# Patient Record
Sex: Male | Born: 1935 | Race: White | Hispanic: No | Smoking: Former smoker
Health system: Southern US, Community
[De-identification: ages and names within clinical notes are randomized; demographics above are authoritative.]

## PROBLEM LIST (undated history)

## (undated) DIAGNOSIS — I714 Abdominal aortic aneurysm, without rupture, unspecified: Secondary | ICD-10-CM

## (undated) DIAGNOSIS — D126 Benign neoplasm of colon, unspecified: Secondary | ICD-10-CM

## (undated) DIAGNOSIS — J90 Pleural effusion, not elsewhere classified: Secondary | ICD-10-CM

## (undated) DIAGNOSIS — K5790 Diverticulosis of intestine, part unspecified, without perforation or abscess without bleeding: Secondary | ICD-10-CM

## (undated) DIAGNOSIS — R0600 Dyspnea, unspecified: Secondary | ICD-10-CM

## (undated) DIAGNOSIS — E7800 Pure hypercholesterolemia, unspecified: Secondary | ICD-10-CM

## (undated) DIAGNOSIS — I10 Essential (primary) hypertension: Secondary | ICD-10-CM

## (undated) DIAGNOSIS — K649 Unspecified hemorrhoids: Secondary | ICD-10-CM

## (undated) DIAGNOSIS — N182 Chronic kidney disease, stage 2 (mild): Secondary | ICD-10-CM

## (undated) DIAGNOSIS — R600 Localized edema: Secondary | ICD-10-CM

## (undated) DIAGNOSIS — I4891 Unspecified atrial fibrillation: Secondary | ICD-10-CM

## (undated) DIAGNOSIS — J9610 Chronic respiratory failure, unspecified whether with hypoxia or hypercapnia: Secondary | ICD-10-CM

## (undated) DIAGNOSIS — M1990 Unspecified osteoarthritis, unspecified site: Secondary | ICD-10-CM

## (undated) HISTORY — DX: Essential (primary) hypertension: I10

## (undated) HISTORY — PX: HERNIA REPAIR: SHX51

## (undated) HISTORY — DX: Diverticulosis of intestine, part unspecified, without perforation or abscess without bleeding: K57.90

## (undated) HISTORY — DX: Benign neoplasm of colon, unspecified: D12.6

## (undated) HISTORY — DX: Abdominal aortic aneurysm, without rupture, unspecified: I71.40

## (undated) HISTORY — DX: Pure hypercholesterolemia, unspecified: E78.00

## (undated) HISTORY — DX: Unspecified osteoarthritis, unspecified site: M19.90

## (undated) HISTORY — PX: TONSILLECTOMY: SHX5217

## (undated) HISTORY — DX: Unspecified atrial fibrillation: I48.91

## (undated) HISTORY — DX: Unspecified hemorrhoids: K64.9

## (undated) HISTORY — DX: Abdominal aortic aneurysm, without rupture: I71.4

---

## 2002-08-29 DIAGNOSIS — D126 Benign neoplasm of colon, unspecified: Secondary | ICD-10-CM

## 2002-08-29 HISTORY — DX: Benign neoplasm of colon, unspecified: D12.6

## 2003-04-02 ENCOUNTER — Encounter: Payer: Self-pay | Admitting: Gastroenterology

## 2006-05-02 ENCOUNTER — Ambulatory Visit: Payer: Self-pay | Admitting: Gastroenterology

## 2006-05-10 ENCOUNTER — Encounter: Payer: Self-pay | Admitting: Gastroenterology

## 2006-05-10 ENCOUNTER — Ambulatory Visit: Payer: Self-pay | Admitting: Gastroenterology

## 2007-08-24 ENCOUNTER — Emergency Department (HOSPITAL_COMMUNITY): Admission: EM | Admit: 2007-08-24 | Discharge: 2007-08-24 | Payer: Self-pay | Admitting: Emergency Medicine

## 2008-01-23 ENCOUNTER — Emergency Department (HOSPITAL_COMMUNITY): Admission: EM | Admit: 2008-01-23 | Discharge: 2008-01-23 | Payer: Self-pay | Admitting: Emergency Medicine

## 2009-03-04 ENCOUNTER — Emergency Department (HOSPITAL_COMMUNITY): Admission: EM | Admit: 2009-03-04 | Discharge: 2009-03-04 | Payer: Self-pay | Admitting: Emergency Medicine

## 2009-04-07 ENCOUNTER — Encounter (INDEPENDENT_AMBULATORY_CARE_PROVIDER_SITE_OTHER): Payer: Self-pay | Admitting: *Deleted

## 2009-06-15 ENCOUNTER — Encounter (INDEPENDENT_AMBULATORY_CARE_PROVIDER_SITE_OTHER): Payer: Self-pay | Admitting: *Deleted

## 2009-06-15 ENCOUNTER — Ambulatory Visit: Payer: Self-pay | Admitting: Gastroenterology

## 2009-06-15 DIAGNOSIS — Z860100 Personal history of colon polyps, unspecified: Secondary | ICD-10-CM | POA: Insufficient documentation

## 2009-06-15 DIAGNOSIS — I4891 Unspecified atrial fibrillation: Secondary | ICD-10-CM | POA: Insufficient documentation

## 2009-07-14 ENCOUNTER — Ambulatory Visit: Payer: Self-pay | Admitting: Gastroenterology

## 2009-07-17 ENCOUNTER — Encounter: Payer: Self-pay | Admitting: Gastroenterology

## 2010-02-19 ENCOUNTER — Emergency Department (HOSPITAL_COMMUNITY): Admission: EM | Admit: 2010-02-19 | Discharge: 2010-02-19 | Payer: Self-pay | Admitting: Emergency Medicine

## 2010-05-28 ENCOUNTER — Ambulatory Visit: Payer: Self-pay | Admitting: Vascular Surgery

## 2010-08-27 ENCOUNTER — Ambulatory Visit: Payer: Self-pay | Admitting: Vascular Surgery

## 2010-11-14 LAB — POCT I-STAT, CHEM 8
Calcium, Ion: 1.14 mmol/L (ref 1.12–1.32)
Creatinine, Ser: 1 mg/dL (ref 0.4–1.5)
Glucose, Bld: 113 mg/dL — ABNORMAL HIGH (ref 70–99)
HCT: 47 % (ref 39.0–52.0)
Hemoglobin: 16 g/dL (ref 13.0–17.0)

## 2010-11-14 LAB — PROTIME-INR: Prothrombin Time: 21.9 seconds — ABNORMAL HIGH (ref 11.6–15.2)

## 2011-01-04 ENCOUNTER — Other Ambulatory Visit: Payer: Self-pay | Admitting: Dermatology

## 2011-01-11 NOTE — Assessment & Plan Note (Signed)
OFFICE VISIT   [name], Patrick L  DOB:  [birth year]                                       08/27/2010  CHART#:[id number]   This is an established patient.   HISTORY OF PRESENT ILLNESS:  This is an 77 year old gentleman who  presents for chief complaint of followup on his abdominal aneurysm and  iliac aneurysm.  He previously had these aneurysms found on CAT scan and  now is presenting for a 3 month followup on those studies.  He since  then has had absolutely no symptoms.  No abdominal or back pain.  He has  had no pelvic pain or any type of claudication symptoms in his legs.   His past medical history, past surgical history, social history, family  history and review of systems and medications and allergies are all  completely unchanged from his previous visit.   PHYSICAL EXAMINATION:  He had a blood pressure of 145/84, heart rate of  77, respirations 12, satting 97%.  On focused exam today on his abdomen  he had soft abdomen, nontender, nondistended, no guarding, no rebound.  There is no palpable pulsatile midline abdominal mass.  Also I could not  appreciate any pulsatile pelvic mass.  Easily palpable pulses in his  lower extremities, strong femoral pulses.  Bilateral feet demonstrate  some evidence of varicosities.  Lungs:  He is clear to auscultation  bilaterally with symmetric expansion.  Good air movement.  Cardiac:  Regular rate and rhythm.  Normal S1, S2.  No murmurs, rubs, thrills or  gallops.   On noninvasive vascular imaging he had an aortoiliac duplex completed.  It demonstrates maximal aortic diameter of 2.49 cm x 2.44 cm at the  maximal dilatation.  The left iliac artery cannot be visualized due to  bowel gas.  However, the right was at 1.36 cm.   MEDICAL DECISION MAKING:  This is an 77 year old gentleman who is  asymptomatic from both of this abdominal aortic ectasia and also his  iliac artery ectasias.  His abdominal aorta is not large  enough to be  considered an aneurysm.  His right common iliac is on the margin of  being large enough to be considered an aneurysm.  Regardless, neither of  them require intervention at this point.  He is completely asymptomatic  at this point.  I would stretch his surveillance out to 2 years with his  elevated age my suspicion is he will never require any type of  intervention from our viewpoint.     Patrick Hertz, MD  Electronically Signed   BLC/MEDQ  D:  08/27/2010  T:  08/27/2010  Job:  [id number]

## 2011-01-11 NOTE — Procedures (Signed)
DUPLEX ULTRASOUND OF ABDOMINAL AORTA   INDICATION:  AAA and right common iliac artery aneurysm followup.   HISTORY:  Diabetes:  Yes.  Cardiac:  Atrial fibrillation.  Hypertension:  Yes.  Smoking:  No.  Connective Tissue Disorder:  Family History:  No.  Previous Surgery:  No.   DUPLEX EXAM:         AP (cm)                   TRANSVERSE (cm)  Proximal             Not visualized            Not visualized  Mid                  2.49 cm                   2.44 cm  Distal               1.97 cm                   2.06 cm  Right Iliac          1.36 cm                   1.36 cm  Left Iliac           Not visualized            Not visualized   PREVIOUS:  Date:  AP:  TRANSVERSE:   IMPRESSION:  1. Abdominal aortic aneurysm noted with largest measurement of 2.49 x      2.44 cm.  2. Limited visualization due to overlying bowel gas.  3. The patient was not n.p.o.  4. Right common iliac artery aneurysm noted with largest measurement      of 1.36 x 1.36 cm.   ___________________________________________  Fransisco Hertz, MD   EM/MEDQ  D:  08/27/2010  T:  08/27/2010  Job:  [id number]

## 2011-01-11 NOTE — Consult Note (Signed)
NEW PATIENT CONSULTATION   [name], Patrick L  DOB:  1921/08/02                                       05/28/2010  CHART#:[id number]   HISTORY OF PRESENT ILLNESS:  This is an 77 year old gentleman who  presents with chief complaint I have aneurysms.  The patient was in  the process of evaluation for gross hematuria, so a CT scan was  completed by his urologist.  In the process of that CT scan,  incidentally an abdominal aortic aneurysm and a right common iliac  aneurysm was found.  The patient notes no history of any back pain or  abdominal pain.  Additionally, he is having no symptoms of right leg  pain.  There is no family history of aneurysmal disease or any type of  connective tissue disorder.  At this point, his hematuria is no longer  active per the patient.  He denies any trauma history that would result  in a dissection in abdomen or in the leg.  Risk factors for aneurysm  disease:  He does have hypertension.  He does have diabetes and does not  smoke.   PAST MEDICAL HISTORY:  Hypertension, diabetes, atrial fibrillation,  hyperlipidemia, osteoarthritis, history of colon polyps, atrial  fibrillation,  allergic rhinitis, skin cancer, erectile dysfunction.   PAST SURGICAL HISTORY:  Bilateral cataract removal,  left inguinal  hernia repair.   SOCIAL HISTORY:  The patient is divorced with three children.  Denies  active tobacco use, quit in 1978.  Alcohol use and illicit drug use is  denied.   FAMILY HISTORY:  Father had emphysema.  Mother had a brain tumor.  He  had a sister with depression.   MEDICATIONS:  Coumadin, Triamterene/HCTZ, metoprolol, Lipitor, niacin,  alendronate, multivitamin, fish oil, glucosamine  chondroitin.   ALLERGIES:  Has no known drug allergies.   REVIEW OF SYSTEMS:  He noted atrial fibrillation and arthritis,  constipation. Otherwise the rest of his 12 point review of systems was  noted to be negative.   PHYSICAL EXAMINATION:   Blood pressure 130/73, heart rate of 79,  respirations 12, saturating 98%.  General:  Well-developed, well-nourished, no apparent distress, alert  and oriented x3.  Head:  Normocephalic, atraumatic.  No obvious temporalis wasting.  ENT:  Hearing is grossly intact.  Nares without any erythema or exudate.  Oropharynx also had no erythema or exudate.  Neck:  supple neck with no nuchal rigidity.  No JVD.  No palpable  cervical lymphadenopathy.  Eyes:  Pupils equal, round, reactive to light.  Extraocular movements  were intact.  Pulmonary:  Clear to auscultation bilaterally, symmetric expansion.  Good air movement.  No rhonchi, rales or wheezing was noted.  GI:  Soft abdomen, nontender, nondistended.  No rebound.  I was not able  to palpate an abdominal aortic aneurysm.  Cardiac:  Regular rate and rhythm.  Normal S1 and S2.  No rubs, thrills  or gallops.  Vascular:  He had palpable pulses throughout.  There is not  a palpable right common iliac aneurysm on exam.  Musculoskeletal:  He had 5/5 strength throughout all extremities.  There  were no obvious signs of gangrene or ulcers on any extremity.  Neurologic:  Cranial nerves II-XII were intact.  Motor exam was as  above.  Sensation was grossly intact in all extremities.  Skin:  The extremities  are as noted above.  There is no obvious rash or  edema anywhere.  Lymphatics:  There is no palpable cervical, axillary or inguinal  lymphadenopathy.   I reviewed an outside hospital CT scan.  Looking at the CT scan,  comparing it to the read, it is immediately evident that they took AP  diameter measurements of the aneurysm and did not actually obtain  tangential measurements of the aneurysm.  Based on the CT scan that is  available, there is no 3-D reconstruction to get tangential measurements  of either the abdominal aortic aneurysm or of the right common iliac  artery.  On one cross-section, at the widest cross-section of this  abdominal aortic  aneurysm, a minimal aneurysm of 2.6 cm noted.  On the  common iliac artery aneurysm a minimal size of 3.2 cm was noted.  Once  again, there are no 3-D reconstructions available for this scan and so  true tangential management of the diameters of these vessels are not  available.   MEDICAL DECISION MAKING:  This is an 77 year old gentleman with  incidental finding of a small abdominal aortic aneurysm and right common  iliac artery aneurysm.  Even with the AP measurements obtained on this  CAT scan, neither vessel would require intervention.  A 3.2 cm aneurysm  was read by the radiologist and no intervention in terms of the  abdominal aortic aneurysm would be considered until 5 to 5.5 cm.  In  terms of the right common iliac artery aneurysm, no intervention  normally is undertaken until 3.5 cm.  Due to the inaccuracies with AP  measurements of both of these vessels, I recommended in 3 months the  patient have a repeat ultrasound of his abdominal aortic aneurysm and  also the right common iliac artery.  The ultrasound measurements are  completed with tangential measurements and thus are more accurate.  Additionally, it is a better screening study for dissection if that is  indeed the concern.  Based on these findings, this patient does not  require any immediate interaction. He is not smoking which has been  cited as the most significant risk factor for expansion of aneurysms.  In terms of abdominal aortic aneurysm, as long as it remains less than 5  cm with less than 0.5 cm growth per 20-month, there is no intention to  intervene.  So I will take a look at this gentleman again in 3 months  and continue his surveillance from there.   I appreciate being given the opportunity to participate in this  gentleman's care.     Leonides Sake, MD  Electronically Signed   BC/MEDQ  D:  05/28/2010  T:  05/31/2010  Job:  [id number]   cc:   Willis Modena, FNP

## 2011-01-14 NOTE — Assessment & Plan Note (Signed)
[facility]                           GASTROENTEROLOGY OFFICE NOTE   NAME:Patrick [name]                         MRN:          [id number]  DATE:05/02/2006                            DOB:          06-Mar-1921    REFERRING PHYSICIAN:  Loraine Leriche A. Perini, M.D.   REASON FOR REFERRAL:  Constipation and personal history of adenomatous colon  polyps.  Patrick [name] is an 77 year old, white male, whom I have evaluated in  the past.  He underwent colonoscopy in August 2004 for routine colorectal-  cancer screening.  Three adenomatous colon polyps were removed.  Diverticulosis and hemorrhoids were also noted.  He returns for followup  today with complaints of ongoing mild constipation that is improved with  stool softeners.  He notes no change in stool caliber, abdominal pain,  rectal pain, diarrhea, or weight loss.  No family history of colon cancer,  colon polyps, or inflammatory bowel disease.  He had a complete physical  exam with Dr. Waynard Edwards in July.   CURRENT MEDICATIONS:  Listed on the medication sheet, updated, and reviewed.   MEDICAL ALLERGIES:  None known.   PAST MEDICAL HISTORY:  Status post hernia repair at age 72, adenomatous colon  polyps, diverticulosis, hemorrhoids, arthritis.   SOCIAL HISTORY:  Per the gastroenterology evaluation form.   REVIEW OF SYSTEMS:  Per gastroenterology evaluation form.   PHYSICAL EXAMINATION:  GENERAL:  Well-developed, well-nourished, white male  appearing younger than his stated age.  VITAL SIGNS:  Weight 197.8 pounds.  Blood pressure is 110/52.  Pulse 80 and  regular.  HEENT:  Anicteric sclerae, oropharynx clear.  CHEST:  Clear to auscultation bilaterally.  CARDIAC:  Regular rate and rhythm without murmurs appreciated.  ABDOMEN:  Soft, nontender, nondistended, normoactive bowel sounds.  No  palpable organomegaly, masses, or hernias.  RECTAL:  Deferred to the time of  colonoscopy.  NEUROLOGIC:  Alert and oriented x3.   Grossly nonfocal.   ASSESSMENT/PLAN:  Chronic constipation and a personal history of 3  adenomatous colon polyps.  He is to increase his fiber and fluid intake.  I  have recommended 6-8 glasses of water a day and approximately 24 grams of  dietary fiber per day.  Risks, benefits, and alternatives to  colonoscopy with possible biopsy and possible polypectomy discussed with the  patient.  He consents to proceed.  This is scheduled electively.                                   Venita Lick. Russella Dar, MD, Clement[facility] Graham   MTS/MedQ  DD:  05/02/2006  DT:  05/02/2006  Job #:  [id number]   cc:   Loraine Leriche A. Perini, M.D.

## 2011-02-16 ENCOUNTER — Other Ambulatory Visit: Payer: Self-pay | Admitting: Dermatology

## 2011-03-09 ENCOUNTER — Telehealth: Payer: Self-pay | Admitting: Cardiovascular Disease

## 2011-03-09 MED ORDER — METOPROLOL SUCCINATE ER 50 MG PO TB24: 50.0000 mg | ORAL_TABLET | Freq: Every day | ORAL | Status: DC

## 2011-03-09 NOTE — Telephone Encounter (Signed)
Pt called app made.

## 2011-03-09 NOTE — Telephone Encounter (Signed)
Pt wants refill toprol-generic 90 day supply sent to [facility] please call when done

## 2011-04-21 ENCOUNTER — Encounter: Payer: Self-pay | Admitting: Cardiovascular Disease

## 2011-04-27 ENCOUNTER — Ambulatory Visit (INDEPENDENT_AMBULATORY_CARE_PROVIDER_SITE_OTHER): Payer: Medicare Other | Admitting: Cardiovascular Disease

## 2011-04-27 ENCOUNTER — Encounter: Payer: Self-pay | Admitting: Cardiovascular Disease

## 2011-04-27 VITALS — BP 114/74 | HR 64 | Ht 70.0 in | Wt 175.8 lb

## 2011-04-27 DIAGNOSIS — I4891 Unspecified atrial fibrillation: Secondary | ICD-10-CM

## 2011-04-27 NOTE — Progress Notes (Signed)
Brigid Re Date of Birth  12/08/1920 [facility] [address]     [address] [place], [place]  [address] [phone]  Fax  [phone]  History of Present Illness:  77 yo gentleman with a hx of  Past Medical History  Diagnosis Date  . Atrial fibrillation   . Hypertension   . Hypercholesterolemia   . Arthritis    Patrick [name] presents today for his one-year visit. He's not had any problems.  He's on chronic Coumadin for his atrial fibrillation. His INR levels are checked by the Community education officer.    Current Outpatient Prescriptions on File Prior to Visit  Medication Sig Dispense Refill  . metoprolol (TOPROL XL) 50 MG 24 hr tablet Take 1 tablet (50 mg total) by mouth daily.  90 tablet  0  . niacin 500 MG tablet Take 500 mg by mouth at bedtime.        . Omega-3 Fatty Acids (FISH OIL) 1200 MG CAPS Take by mouth daily.       Bertram Gala Glycol-Propyl Glycol (SYSTANE OP) Apply to eye 2 (two) times daily.        . Polyethylene Glycol 3350 (MIRALAX PO) Take by mouth 2 (two) times a week.        . Warfarin Sodium (COUMADIN PO) Take by mouth. Take as Directed         No Known Allergies  Past Medical History  Diagnosis Date  . Atrial fibrillation   . Hypertension   . Hypercholesterolemia   . Arthritis     Past Surgical History  Procedure Date  . Hernia repair Age 18  . Tonsillectomy     Very Young    History  Smoking status  . Former Smoker  Smokeless tobacco  . Not on file    History  Alcohol Use: Not on file    No family history on file.  Reviw of Systems:  Reviewed in the HPI.  All other systems are negative.  Physical Exam: BP 114/74  Pulse 64  Ht 5\' 10"  (1.778 m)  Wt 175 lb 12.8 oz (79.742 kg)  BMI 25.22 kg/m2 The patient is alert and oriented x 3.  The mood and affect are normal.   Skin: warm and dry.  Color is normal.    HEENT:   the sclera are nonicteric.  The mucous membranes are moist.  The carotids are  2+ without bruits.  There is no thyromegaly.  There is no JVD.    Lungs: clear.  The chest wall is non tender.    Heart: Irregular rate with a normal S1 and S2.  There are no murmurs, gallops, or rubs. The PMI is not displaced.     Abdomen: good bowel sounds.  There is no guarding or rebound.  There is no hepatosplenomegaly or tenderness.  There are no masses.   Extremities:  no clubbing, cyanosis, or edema.  The legs are without rashes.  The distal pulses are intact.   Neuro:  Cranial nerves II - XII are intact.  Motor and sensory functions are intact.    The gait is normal.  ECG: A-Fib with a controlled ventricular response.  Assessment / Plan:

## 2011-04-27 NOTE — Assessment & Plan Note (Signed)
His rate remains well controlled. His Coumadin levels are being managed by Dr. Waynard Edwards. I'll see him again in one year.

## 2011-04-28 ENCOUNTER — Encounter: Payer: Self-pay | Admitting: Cardiovascular Disease

## 2011-05-25 LAB — PROTIME-INR
INR: 1.4
Prothrombin Time: 17.8 — ABNORMAL HIGH

## 2011-06-03 LAB — APTT: aPTT: 67 — ABNORMAL HIGH

## 2011-06-03 LAB — DIFFERENTIAL
Basophils Relative: 1
Eosinophils Absolute: 0.1
Eosinophils Relative: 1
Lymphocytes Relative: 17
Neutrophils Relative %: 74

## 2011-06-03 LAB — PROTIME-INR: INR: 5.8

## 2011-06-03 LAB — CBC
RBC: 5.34
WBC: 7.1

## 2011-06-07 ENCOUNTER — Other Ambulatory Visit: Payer: Self-pay | Admitting: Cardiovascular Disease

## 2011-09-22 ENCOUNTER — Telehealth: Payer: Self-pay | Admitting: *Deleted

## 2011-09-22 MED ORDER — METOPROLOL TARTRATE 25 MG PO TABS: 25.0000 mg | ORAL_TABLET | Freq: Two times a day (BID) | ORAL | Status: DC

## 2011-09-22 NOTE — Telephone Encounter (Signed)
Spoke with pt, script changed to generic, pt accepting of plan

## 2012-06-11 ENCOUNTER — Encounter: Payer: Self-pay | Admitting: Gastroenterology

## 2012-07-06 ENCOUNTER — Encounter: Payer: Medicare Other | Admitting: Cardiovascular Disease

## 2012-07-06 NOTE — Progress Notes (Signed)
This encounter was created in error - please disregard.

## 2012-07-13 ENCOUNTER — Encounter: Payer: Self-pay | Admitting: Cardiovascular Disease

## 2012-08-02 ENCOUNTER — Other Ambulatory Visit: Payer: Self-pay | Admitting: *Deleted

## 2012-08-02 DIAGNOSIS — I714 Abdominal aortic aneurysm, without rupture: Secondary | ICD-10-CM

## 2012-08-03 ENCOUNTER — Encounter: Payer: Self-pay | Admitting: Neurosurgery

## 2012-08-06 ENCOUNTER — Ambulatory Visit: Payer: Medicare Other | Admitting: Gastroenterology

## 2012-08-06 ENCOUNTER — Encounter: Payer: Self-pay | Admitting: Neurosurgery

## 2012-08-06 ENCOUNTER — Encounter (INDEPENDENT_AMBULATORY_CARE_PROVIDER_SITE_OTHER): Payer: Medicare Other | Admitting: *Deleted

## 2012-08-06 ENCOUNTER — Ambulatory Visit (INDEPENDENT_AMBULATORY_CARE_PROVIDER_SITE_OTHER): Payer: Medicare Other | Admitting: Neurosurgery

## 2012-08-06 VITALS — BP 141/72 | HR 62 | Resp 16 | Ht 69.0 in | Wt 177.7 lb

## 2012-08-06 DIAGNOSIS — I714 Abdominal aortic aneurysm, without rupture: Secondary | ICD-10-CM

## 2012-08-06 DIAGNOSIS — I723 Aneurysm of iliac artery: Secondary | ICD-10-CM

## 2012-08-06 NOTE — Progress Notes (Signed)
[facility] AAA/Carotid Office Note  CC: AAA surveillance Referring Physician: Imogene Burn  History of Present Illness: 78 year old male patient of Dr. Imogene Burn with known history of AAA intervention. The patient denies any unusual abdominal or back pain. The patient denies any new medical diagnoses or recent surgery.  Past Medical History  Diagnosis Date  . Atrial fibrillation   . Hypertension   . Hypercholesterolemia   . Arthritis   . AAA (abdominal aortic aneurysm)     ROS: [x]  Positive   [ ]  Denies    General: [ ]  Weight loss, [ ]  Fever, [ ]  chills Neurologic: [ ]  Dizziness, [ ]  Blackouts, [ ]  Seizure [ ]  Stroke, [ ]  "Mini stroke", [ ]  Slurred speech, [ ]  Temporary blindness; [ ]  weakness in arms or legs, [ ]  Hoarseness Cardiac: [ ]  Chest pain/pressure, [ ]  Shortness of breath at rest [ ]  Shortness of breath with exertion, [ ]  Atrial fibrillation or irregular heartbeat Vascular: [ ]  Pain in legs with walking, [ ]  Pain in legs at rest, [ ]  Pain in legs at night,  [ ]  Non-healing ulcer, [ ]  Blood clot in vein/DVT,   Pulmonary: [ ]  Home oxygen, [ ]  Productive cough, [ ]  Coughing up blood, [ ]  Asthma,  [ ]  Wheezing Musculoskeletal:  [ ]  Arthritis, [ ]  Low back pain, [ ]  Joint pain Hematologic: [ ]  Easy Bruising, [ ]  Anemia; [ ]  Hepatitis Gastrointestinal: [ ]  Blood in stool, [ ]  Gastroesophageal Reflux/heartburn, [ ]  Trouble swallowing Urinary: [ ]  chronic Kidney disease, [ ]  on HD - [ ]  MWF or [ ]  TTHS, [ ]  Burning with urination, [ ]  Difficulty urinating Skin: [ ]  Rashes, [ ]  Wounds Psychological: [ ]  Anxiety, [ ]  Depression   Social History History  Substance Use Topics  . Smoking status: Former Games developer  . Smokeless tobacco: Never Used  . Alcohol Use: No    Family History History reviewed. No pertinent family history.  No Known Allergies  Current Outpatient Prescriptions  Medication Sig Dispense Refill  . alendronate (FOSAMAX) 70 MG tablet Take  70 mg by mouth every 7 (seven) days. Take with a full glass of water on an empty stomach.       Marland Kitchen atorvastatin (LIPITOR) 80 MG tablet Take 80 mg by mouth daily.        . Calcium Carbonate-Vitamin D (CALTRATE 600+D PO) Take 600 mg by mouth 2 (two) times daily.        Tery Sanfilippo Calcium (STOOL SOFTENER PO) Take 100 mg by mouth 2 (two) times daily.        . Glucosamine-Chondroit-Vit C-Mn CAPS Take by mouth.        . hydroxypropyl methylcellulose (ISOPTO TEARS) 2.5 % ophthalmic solution Place 1 drop into both eyes as needed.        Marland Kitchen lisinopril (PRINIVIL,ZESTRIL) 5 MG tablet Take 5 mg by mouth daily.        . metoprolol tartrate (LOPRESSOR) 25 MG tablet Take 1 tablet (25 mg total) by mouth 2 (two) times daily.  180 tablet  3  . Multiple Vitamin (MULTIVITAMIN PO) Take by mouth.        . niacin (NIASPAN) 500 MG CR tablet       . niacin 500 MG tablet Take 500 mg by mouth at bedtime.        . Omega-3 Fatty Acids (FISH OIL) 1200 MG CAPS Take by mouth daily.       Marland Kitchen  Polyethyl Glycol-Propyl Glycol (SYSTANE OP) Apply to eye 2 (two) times daily.        . Polyethylene Glycol 3350 (MIRALAX PO) Take by mouth 2 (two) times a week.        . triamterene-hydrochlorothiazide (MAXZIDE-25) 37.5-25 MG per tablet Take 1 tablet by mouth daily.        . Warfarin Sodium (COUMADIN PO) Take by mouth. Take as Directed         Physical Examination  Filed Vitals:   08/06/12 1002  BP: 141/72  Pulse: 62  Resp: 16    Body mass index is 26.24 kg/(m^2).  General:  WDWN in NAD Gait: Normal HEENT: WNL Eyes: Pupils equal Pulmonary: normal non-labored breathing , without Rales, rhonchi,  wheezing Cardiac: RRR, without  Murmurs, rubs or gallops; Abdomen: soft, NT, no masses Skin: no rashes, ulcers noted  Vascular Exam Pulses: 3+ radial pulses bilaterally, palpable femoral pulses bilaterally Carotid bruits: Carotid pulses to auscultation no bruits are heard Extremities without ischemic changes, no Gangrene , no  cellulitis; no open wounds;  Musculoskeletal: no muscle wasting or atrophy   Neurologic: A&O X 3; Appropriate Affect ; SENSATION: normal; MOTOR FUNCTION:  moving all extremities equally. Speech is fluent/normal  Non-Invasive Vascular Imaging AAA-2.4 AP by 2.5 transverse which is unchanged from previous exam December 2011. However the right iliac was 1.36 cm AP at that time and is now 2.56 AP by 3.0 transverse. I reviewed this with Dr. Myra Gianotti who recommends the patient have a CT of the abdomen and return to see Dr. Imogene Burn for possible treatment plan  ASSESSMENT/PLAN: Assessment as above, the patient will have a CT A. of the abdomen pelvis and return to see Dr. Imogene Burn in the next one to 2 months. The patient's in agreement with this, his questions were encouraged and answered.  Lauree Chandler ANP   Clinic MD: Myra Gianotti

## 2012-08-06 NOTE — Addendum Note (Signed)
Addended by: Lorin Mercy K on: 08/06/2012 11:30 AM   Modules accepted: Orders

## 2012-08-07 ENCOUNTER — Ambulatory Visit (INDEPENDENT_AMBULATORY_CARE_PROVIDER_SITE_OTHER): Payer: Medicare Other | Admitting: Cardiovascular Disease

## 2012-08-07 ENCOUNTER — Encounter: Payer: Self-pay | Admitting: Cardiovascular Disease

## 2012-08-07 VITALS — BP 120/70 | HR 72 | Ht 69.0 in | Wt 178.0 lb

## 2012-08-07 DIAGNOSIS — I4891 Unspecified atrial fibrillation: Secondary | ICD-10-CM

## 2012-08-07 NOTE — Progress Notes (Signed)
Patrick [name] Date of Birth  1921/06/06 [facility] / [facility] [address]     [address] [place], [place]  [address] [phone]  Fax  [phone]  Problems: 1. Atrial Fibrillation 2. Hyperlipidemia 3. Hypertension   History of Present Illness:  78 yo gentleman with a hx of atrial fibrillation.  It's his birthday today.  He is still fairly active.   He does not some yard work but is Media planner out some of it.  He worked at [employer] this past summer.   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  Patrick [name] presents today for his one-year visit. He's not had any problems.  He's on chronic Coumadin for his atrial fibrillation. His INR levels are checked by the Community education officer.    Past Medical History  Diagnosis Date  . Atrial fibrillation   . Hypertension   . Hypercholesterolemia   . Arthritis   . AAA (abdominal aortic aneurysm)      Current Outpatient Prescriptions on File Prior to Visit  Medication Sig Dispense Refill  . alendronate (FOSAMAX) 70 MG tablet Take 70 mg by mouth every 7 (seven) days. Take with a full glass of water on an empty stomach.       Marland Kitchen atorvastatin (LIPITOR) 80 MG tablet Take 80 mg by mouth daily.        . Calcium Carbonate-Vitamin D (CALTRATE 600+D PO) Take 600 mg by mouth 2 (two) times daily.        Tery Sanfilippo Calcium (STOOL SOFTENER PO) Take 100 mg by mouth 2 (two) times daily.        . Glucosamine-Chondroit-Vit C-Mn CAPS Take by mouth.        . hydroxypropyl methylcellulose (ISOPTO TEARS) 2.5 % ophthalmic solution Place 1 drop into both eyes as needed.        Marland Kitchen lisinopril (PRINIVIL,ZESTRIL) 5 MG tablet Take 5 mg by mouth daily.        . metoprolol tartrate (LOPRESSOR) 25 MG tablet Take 1 tablet (25 mg total) by mouth 2 (two) times daily.  180 tablet  3  . Multiple Vitamin (MULTIVITAMIN PO) Take by mouth.        . niacin (NIASPAN) 500 MG CR tablet Take 500 mg by mouth at bedtime.        . niacin 500 MG tablet Take 500 mg by mouth at bedtime.        . Omega-3 Fatty Acids (FISH OIL) 1200 MG CAPS Take by mouth daily.       Bertram Gala Glycol-Propyl Glycol (SYSTANE OP) Apply to eye 2 (two) times daily.        . Polyethylene Glycol 3350 (MIRALAX PO) Take by mouth 2 (two) times a week.        . triamterene-hydrochlorothiazide (MAXZIDE-25) 37.5-25 MG per tablet Take 1 tablet by mouth daily.        . Warfarin Sodium (COUMADIN PO) Take by mouth. Take as Directed         No Known Allergies  Past Medical History  Diagnosis Date  . Atrial fibrillation   . Hypertension   . Hypercholesterolemia   . Arthritis   . AAA (abdominal aortic aneurysm)     Past Surgical History  Procedure Date  . Hernia repair Age 39  . Tonsillectomy     Very Young    History  Smoking status  . Former Smoker  Smokeless tobacco  . Never Used  History  Alcohol Use No    No family history on file.  Reviw of Systems:  Reviewed in the HPI.  All other systems are negative.  Physical Exam: BP 120/70  Pulse 72  Ht 5\' 9"  (1.753 m)  Wt 178 lb (80.74 kg)  BMI 26.29 kg/m2 The patient is alert and oriented x 3.  The mood and affect are normal.   Skin: warm and dry.  Color is normal.    HEENT:   the sclera are nonicteric.  The mucous membranes are moist.  The carotids are 2+ without bruits.  There is no thyromegaly.  There is no JVD.    Lungs: clear.  The chest wall is non tender.    Heart: Irregular rate with a normal S1 and S2.  There are no murmurs, gallops, or rubs. The PMI is not displaced.     Abdomen: good bowel sounds.  There is no guarding or rebound.  There is no hepatosplenomegaly or tenderness.  There are no masses.   Extremities:  no clubbing, cyanosis, or edema.  The legs are without rashes.  The distal pulses are intact.   Neuro:  Cranial nerves II - XII are intact.  Motor and sensory functions are intact.    The gait is normal.  ECG: 08/07/2012-A-Fib with a  controlled ventricular response of 72.  Assessment / Plan:

## 2012-08-07 NOTE — Assessment & Plan Note (Signed)
Patrick [name] is doing very well. He has stable chronic atrial fibrillation. He's on chronic anticoagulation. His INR levels are checked at Community education officer.  Remains fairly busy. I've encouraged him to continue to get some regular exercise. I'll see her again in one year for followup office visit and EKG. His labs are checked at Dr. Laurey Morale office.

## 2012-08-07 NOTE — Patient Instructions (Addendum)
Your physician wants you to follow-up in: 1 Year You will receive a reminder letter in the mail two months in advance. If you don't receive a letter, please call our office to schedule the follow-up appointment.  Continue with Same Medications

## 2012-08-14 ENCOUNTER — Other Ambulatory Visit: Payer: Self-pay | Admitting: Dermatology

## 2012-08-31 ENCOUNTER — Encounter: Payer: Self-pay | Admitting: Gastroenterology

## 2012-08-31 ENCOUNTER — Ambulatory Visit (INDEPENDENT_AMBULATORY_CARE_PROVIDER_SITE_OTHER): Payer: Medicare Other | Admitting: Gastroenterology

## 2012-08-31 VITALS — BP 122/68 | HR 80 | Ht 69.25 in | Wt 178.2 lb

## 2012-08-31 DIAGNOSIS — Z86010 Personal history of colonic polyps: Secondary | ICD-10-CM

## 2012-08-31 NOTE — Progress Notes (Signed)
History of Present Illness: This is an 79 year old male with a history of tubulovillous adenoma in 2007 and 4 small tubular adenomas at 2010. He returns for consideration of surveillance colonoscopy. He has no gastrointestinal complaints. He is maintained on Coumadin for atrial fibrillation. His health status is stable. Denies weight loss, abdominal pain, constipation, diarrhea, change in stool caliber, melena, hematochezia, nausea, vomiting, dysphagia, reflux symptoms, chest pain.   Review of Systems: Pertinent positive and negative review of systems were noted in the above HPI section. All other review of systems were otherwise negative.  Current Medications, Allergies, Past Medical History, Past Surgical History, Family History and Social History were reviewed in [employer] record.  Physical Exam: General: Well developed , well nourished, no acute distress Head: Normocephalic and atraumatic Eyes:  sclerae anicteric, EOMI Ears: Normal auditory acuity Mouth: No deformity or lesions Neck: Supple, no masses or thyromegaly Lungs: Clear throughout to auscultation Heart: Regular rate and rhythm; no murmurs, rubs or bruits Abdomen: Soft, non tender and non distended. No masses, hepatosplenomegaly or hernias noted. Normal Bowel sounds Rectal: Deferred Musculoskeletal: Symmetrical with no gross deformities  Skin: No lesions on visible extremities Pulses:  Normal pulses noted Extremities: No clubbing, cyanosis, edema or deformities noted Neurological: Alert oriented x 4, grossly nonfocal Cervical Nodes:  No significant cervical adenopathy Inguinal Nodes: No significant inguinal adenopathy Psychological:  Alert and cooperative. Normal mood and affect  Assessment and Recommendations:  1. Personal history of tubulovillous adenomatous colon polyps. Although his 79 years old he is in very good overall health and it is reasonable to consider surveillance colonoscopy. We  discussed the risks, benefits and alternatives to surveillance colonoscopy or no colonoscopy. Since he is asymptomatic from a gastrointestinal standpoint and he underwent colonoscopy in 2010, he would like to defer future surveillance colonoscopies and I agree that this is a reasonable approach. Will cancel future surveillance colonoscopies. If he develops gastrointestinal complaints I would be happy to reevaluate him in the future.

## 2012-08-31 NOTE — Patient Instructions (Addendum)
We have cancelled your Recall Colonoscopy. Please follow as needed.  cc: Rodrigo Ran, MD

## 2012-09-19 ENCOUNTER — Other Ambulatory Visit: Payer: Self-pay | Admitting: Vascular Surgery

## 2012-09-20 ENCOUNTER — Encounter: Payer: Self-pay | Admitting: Vascular Surgery

## 2012-09-21 ENCOUNTER — Encounter: Payer: Self-pay | Admitting: Vascular Surgery

## 2012-09-21 ENCOUNTER — Ambulatory Visit (INDEPENDENT_AMBULATORY_CARE_PROVIDER_SITE_OTHER): Payer: Medicare Other | Admitting: Vascular Surgery

## 2012-09-21 ENCOUNTER — Ambulatory Visit
Admission: RE | Admit: 2012-09-21 | Discharge: 2012-09-21 | Disposition: A | Discharge status: Home / Self Care | Payer: Medicare Other | Source: Ambulatory Visit | Attending: Neurosurgery | Admitting: Neurosurgery

## 2012-09-21 ENCOUNTER — Other Ambulatory Visit: Payer: Self-pay | Admitting: *Deleted

## 2012-09-21 VITALS — BP 136/67 | HR 80 | Ht 69.5 in | Wt 178.2 lb

## 2012-09-21 DIAGNOSIS — I714 Abdominal aortic aneurysm, without rupture: Secondary | ICD-10-CM

## 2012-09-21 DIAGNOSIS — I723 Aneurysm of iliac artery: Secondary | ICD-10-CM | POA: Insufficient documentation

## 2012-09-21 MED ORDER — IOHEXOL 350 MG/ML SOLN
100.0000 mL | Freq: Once | INTRAVENOUS | Status: AC | PRN
  Administered 2012-09-21: 100 mL via INTRAVENOUS

## 2012-09-21 NOTE — Progress Notes (Signed)
[facility] [place]  Established Abdominal Aortic Aneurysm  History of Present Illness  The patient is a 79 y.o. (07/15/1921) male who presents with chief complaint: follow up for AAA.  Previous studies demonstrate an AAA, measuring 3.0 cm.  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  On subsequent, duplex R CIA: 2.8 cm.  The patient does not have back or abdominal pain or any leg sx.  The patient is not a smoker.  He returns from a CTA abd/pelvis to reinterrogate both aneurysms.  The patient's PMH, PSH, SH, FamHx, Med, Allergies and ROS are unchanged from 08/06/12.  Physical Examination  Filed Vitals:   09/21/12 1234  BP: 136/67  Pulse: 80  Height: 5' 9.5" (1.765 m)  Weight: 178 lb 3.2 oz (80.831 kg)  SpO2: 100%   Body mass index is 25.94 kg/(m^2).  General: A&O x 3, WDWN  Pulmonary: Sym exp, good air movt, CTAB, no rales, rhonchi, & wheezing  Cardiac: RRR, Nl S1, S2, no Murmurs, rubs or gallops  Vascular: Vessel Right Left  Radial Palpable Palpable  Ulnar Palpable Palpable  Brachial Palpable Palpable  Carotid Palpable, without bruit Palpable, without bruit  Aorta Not palpable N/A  Femoral Palpable Palpable  Popliteal Not palpable Not palpable  PT Palpable Palpable  DP Palpable Palpable   Gastrointestinal: soft, NTND, -G/R, - HSM, - masses, - CVAT B  Musculoskeletal: M/S 5/5 throughout , Extremities without ischemic changes   Neurologic: Pain and light touch intact in extremities , Motor exam as listed above  CTA Abd/pelvis (Date: 09/21/12)  1. 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  2. As previously seen, both have the mural abnormalities suggesting either penetrating atheromatous ulcers or partially thrombosed dissection flaps.  Based on my review of the CTA and previous CTA (05/24/10), this patient has minimal change in the morphology and size of  both the AAA and R CIA.  Medical Decision Making  The patient is a 79 y.o. male who presents with: asx small AAA and asx R CIA aneurysm.   Based on this patient's exam and diagnostic studies, he needs a repeat 6 month aortoiliac duplex to re-evaluate both aneurysms.  If both are unchanged, I would resume annual surveillance..  The threshold for repair is AAA size > 5.5 cm, growth > 1 cm/yr, and symptomatic status.  For iliac aneurysm, the threshold is 3.5 cm, though any evidence of embolization can force a repair.  In this 79 year old male, I don't see an benefit to aggressive management at this point.    I emphasized the importance of maximal medical management including strict control of blood pressure, blood glucose, and lipid levels, antiplatelet agents, obtaining regular exercise, and cessation of smoking.    Thank you for allowing [place] to participate in this patient's care.  Leonides Sake, MD [facility] [place] Office: [phone] Pager: [phone]  09/21/2012, 1:42 PM

## 2012-10-04 ENCOUNTER — Telehealth: Payer: Self-pay | Admitting: Cardiovascular Disease

## 2012-10-04 ENCOUNTER — Other Ambulatory Visit: Payer: Self-pay | Admitting: *Deleted

## 2012-10-04 MED ORDER — METOPROLOL TARTRATE 25 MG PO TABS: 25.0000 mg | ORAL_TABLET | Freq: Two times a day (BID) | ORAL | Status: DC

## 2012-10-04 NOTE — Telephone Encounter (Signed)
New problem    Metoprolol  tartrate 50 mg   Prime mail

## 2013-02-18 ENCOUNTER — Other Ambulatory Visit: Payer: Self-pay | Admitting: *Deleted

## 2013-02-18 MED ORDER — TRIAMTERENE-HCTZ 37.5-25 MG PO TABS: 1.0000 | ORAL_TABLET | Freq: Every day | ORAL | Status: DC

## 2013-03-22 ENCOUNTER — Ambulatory Visit: Payer: Medicare Other | Admitting: Vascular Surgery

## 2013-03-22 ENCOUNTER — Other Ambulatory Visit: Payer: Medicare Other

## 2013-04-03 ENCOUNTER — Other Ambulatory Visit: Payer: Self-pay | Admitting: Dermatology

## 2013-05-02 ENCOUNTER — Encounter: Payer: Self-pay | Admitting: Vascular Surgery

## 2013-05-03 ENCOUNTER — Other Ambulatory Visit (INDEPENDENT_AMBULATORY_CARE_PROVIDER_SITE_OTHER): Payer: Medicare Other | Admitting: *Deleted

## 2013-05-03 ENCOUNTER — Encounter: Payer: Self-pay | Admitting: Vascular Surgery

## 2013-05-03 ENCOUNTER — Ambulatory Visit (INDEPENDENT_AMBULATORY_CARE_PROVIDER_SITE_OTHER): Payer: Medicare Other | Admitting: Vascular Surgery

## 2013-05-03 VITALS — BP 144/74 | HR 69 | Resp 18 | Ht 69.0 in | Wt 171.0 lb

## 2013-05-03 DIAGNOSIS — I714 Abdominal aortic aneurysm, without rupture, unspecified: Secondary | ICD-10-CM | POA: Insufficient documentation

## 2013-05-03 DIAGNOSIS — I723 Aneurysm of iliac artery: Secondary | ICD-10-CM

## 2013-05-03 NOTE — Progress Notes (Signed)
[facility] [place]  Established Abdominal Aortic Aneurysm  History of Present Illness  The patient is a 79 y.o. (04-04-1921) male who presents with chief complaint: follow up for AAA.  Previous studies demonstrate an AAA, measuring 2.8 cm with R CIA 3.0 cm.  The patient does not have back or abdominal pain.  The patient is not a smoker.  The patient denies any changes in feet or toes consistent with embolic disease  The patient's PMH, PSH, SH, FamHx, Med, and Allergies are unchanged from 09/21/12.  On ROS today: no intermittent claudication, no evidence of embolic disease in toes  Physical Examination  Filed Vitals:   05/03/13 0947  BP: 144/74  Pulse: 69  Resp: 18  Height: 5\' 9"  (1.753 m)  Weight: 171 lb (77.565 kg)   Body mass index is 25.24 kg/(m^2).  General: A&O x 3, WDWN  Pulmonary: Sym exp, good air movt, CTAB, no rales, rhonchi, & wheezing  Cardiac: RRR, Nl S1, S2, no Murmurs, rubs or gallops  Vascular: Vessel Right Left  Radial Palpable Palpable  Brachial Palpable Palpable  Carotid Palpable, without bruit Palpable, without bruit  Aorta Not palpable N/A  Femoral Palpable Palpable  Popliteal Palpable Not palpable  PT Palpable Palpable  DP Palpable Palpable   Gastrointestinal: soft, NTND, -G/R, - HSM, - masses, - CVAT B  Musculoskeletal: M/S 5/5 throughout , Extremities without ischemic changes , B cyanotic feet with extensive venous changes c/w CVI  Neurologic: Pain and light touch intact in extremities . Motor exam as listed above  Non-Invasive Vascular Imaging  AAA Duplex (05/03/2013)  Previous size: 2.8 cm (Date: 08/06/12)  Current size:  3.2 cm (Date: 05/03/2013)  R CIA: 2.5 cm x 2.7 cm (2.56cm  x 3.0 cm)  Medical Decision Making  The patient is a 79 y.o. male who presents with: asymptomatic AAA with stable size , stable asx R CIA   Based on this patient's exam and diagnostic studies, he needs q6 month AAA duplex.  I would  also obtain a B popliteal artery duplex to evaluate for popliteal aneurysm as I can feel a prominent R pop pulse today.  The threshold for repair is AAA size > 5.5 cm, growth > 1 cm/yr, and symptomatic status.  I emphasized the importance of maximal medical management including strict control of blood pressure, blood glucose, and lipid levels, antiplatelet agents, obtaining regular exercise, and cessation of smoking.    Thank you for allowing [place] to participate in this patient's care.  Leonides Sake, MD [facility] [place] Office: [phone] Pager: [phone]  05/03/2013, 12:51 PM

## 2013-07-29 ENCOUNTER — Encounter: Payer: Self-pay | Admitting: Cardiovascular Disease

## 2013-08-07 ENCOUNTER — Ambulatory Visit (INDEPENDENT_AMBULATORY_CARE_PROVIDER_SITE_OTHER): Payer: Medicare Other | Admitting: Cardiovascular Disease

## 2013-08-07 ENCOUNTER — Encounter: Payer: Self-pay | Admitting: Cardiovascular Disease

## 2013-08-07 VITALS — BP 116/82 | HR 58 | Ht 69.0 in | Wt 171.8 lb

## 2013-08-07 DIAGNOSIS — I4891 Unspecified atrial fibrillation: Secondary | ICD-10-CM

## 2013-08-07 NOTE — Patient Instructions (Signed)
Your physician recommends that you continue on your current medications as directed. Please refer to the Current Medication list given to you today.  Your physician wants you to follow-up in: 1 year. You will receive a reminder letter in the mail two months in advance. If you don't receive a letter, please call our office to schedule the follow-up appointment.  

## 2013-08-07 NOTE — Progress Notes (Signed)
Patrick [name] Date of Birth  1920/10/08 [facility] [address]     [address] [place], [place]  [address] [phone]  Fax  [phone]  Problems: 1. Atrial Fibrillation 2. Hyperlipidemia 3. Hypertension   History of Present Illness:  79 yo gentleman with a hx of atrial fibrillation.  It's his birthday today.  He is still fairly active.   He does not some yard work but is Media planner out some of it.  He worked at [employer] this past summer.   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  Patrick [name] presents today for his one-year visit. He's not had any problems.  He's on chronic Coumadin for his atrial fibrillation. His INR levels are checked by the Community education officer.    Dec. 10, 2014:  Patrick [name] is doing well.  It is his birthday today.  He is able to below his normal activities without any significant problems. He has chronic atrial fibrillation. He's on Coumadin and his INR levels are checked at the medical Solicitor.  Past Medical History  Diagnosis Date  . Atrial fibrillation   . Hypertension   . Hypercholesterolemia   . Arthritis   . AAA (abdominal aortic aneurysm)   . Adenomatous colon polyp 2004    TVA 04/2006  . Diverticulosis   . Hemorrhoids      Current Outpatient Prescriptions on File Prior to Visit  Medication Sig Dispense Refill  . alendronate (FOSAMAX) 70 MG tablet Take 70 mg by mouth every 7 (seven) days. Take with a full glass of water on an empty stomach.       Marland Kitchen atorvastatin (LIPITOR) 80 MG tablet Take 80 mg by mouth daily.        . Calcium Carbonate-Vitamin D (CALTRATE 600+D PO) Take 600 mg by mouth 2 (two) times daily.        Marland Kitchen docusate sodium (COLACE) 100 MG capsule Take 100 mg by mouth 2 (two) times daily.      . Glucosamine-Chondroit-Vit C-Mn CAPS Take by mouth.        . hydroxypropyl methylcellulose (ISOPTO TEARS) 2.5 % ophthalmic solution Place 1 drop into both eyes as  needed.        Marland Kitchen lisinopril (PRINIVIL,ZESTRIL) 5 MG tablet Take 5 mg by mouth daily.        . metoprolol tartrate (LOPRESSOR) 25 MG tablet Take 1 tablet (25 mg total) by mouth 2 (two) times daily.  180 tablet  3  . Multiple Vitamin (MULTIVITAMIN PO) Take by mouth.        . niacin 500 MG tablet Take 500 mg by mouth at bedtime.        . Omega-3 Fatty Acids (FISH OIL) 1200 MG CAPS Take by mouth daily.       Bertram Gala Glycol-Propyl Glycol (SYSTANE OP) Apply to eye 2 (two) times daily.        . Polyethylene Glycol 3350 (MIRALAX PO) Take by mouth 2 (two) times a week.        . triamterene-hydrochlorothiazide (MAXZIDE-25) 37.5-25 MG per tablet Take 1 tablet by mouth daily.  90 tablet  1  . Warfarin Sodium (COUMADIN PO) Take by mouth. Take as Directed        No current facility-administered medications on file prior to visit.    No Known Allergies  Past Medical History  Diagnosis Date  . Atrial fibrillation   . Hypertension   . Hypercholesterolemia   .  Arthritis   . AAA (abdominal aortic aneurysm)   . Adenomatous colon polyp 2004    TVA 04/2006  . Diverticulosis   . Hemorrhoids     Past Surgical History  Procedure Laterality Date  . Hernia repair  Age 72  . Tonsillectomy      Very Young    History  Smoking status  . Former Smoker  . Types: Cigarettes  . Quit date: 08/29/1969  Smokeless tobacco  . Never Used    History  Alcohol Use  . Yes    Comment: glass of wine 1-2 times per week    Family History  Problem Relation Age of Onset  . Emphysema Father     Reviw of Systems:  Reviewed in the HPI.  All other systems are negative.  Physical Exam: BP 116/82  Pulse 58  Ht 5\' 9"  (1.753 m)  Wt 171 lb 12.8 oz (77.928 kg)  BMI 25.36 kg/m2 The patient is alert and oriented x 3.  The mood and affect are normal.   Skin: warm and dry.  Color is normal.    HEENT:   the sclera are nonicteric.  The mucous membranes are moist.  The carotids are 2+ without bruits.  There is no  thyromegaly.  There is no JVD.    Lungs: clear.  The chest wall is non tender.    Heart: Irregular rate with a normal S1 and S2.  There are no murmurs, gallops, or rubs. The PMI is not displaced.     Abdomen: good bowel sounds.  There is no guarding or rebound.  There is no hepatosplenomegaly or tenderness.  There are no masses.   Extremities:  no clubbing, cyanosis, or edema.  The legs are without rashes.  The distal pulses are intact.   Neuro:  Cranial nerves II - XII are intact.  Motor and sensory functions are intact.    The gait is normal.  ECG: Dec. 10, 2014:  Atrial fibrillation with a slow ventricular response 50. He has an incomplete right bundle branch block.  Assessment / Plan:

## 2013-08-07 NOTE — Assessment & Plan Note (Signed)
Patrick [name] is doing very well. He looks very good for 79 years old. It is his birthday today. He is well-controlled atrial fibrillation. We'll continue with his same medications. His INR levels are managed at Community education officer.  I will see him  again in one year for an office visit and followup EKG.

## 2013-09-13 ENCOUNTER — Other Ambulatory Visit: Payer: Self-pay | Admitting: *Deleted

## 2013-09-13 MED ORDER — TRIAMTERENE-HCTZ 37.5-25 MG PO TABS: 1.0000 | ORAL_TABLET | Freq: Every day | ORAL | Status: DC

## 2013-09-27 ENCOUNTER — Other Ambulatory Visit: Payer: Self-pay | Admitting: Vascular Surgery

## 2013-09-27 DIAGNOSIS — I77819 Aortic ectasia, unspecified site: Secondary | ICD-10-CM

## 2013-09-27 DIAGNOSIS — I714 Abdominal aortic aneurysm, without rupture, unspecified: Secondary | ICD-10-CM

## 2013-09-27 DIAGNOSIS — I723 Aneurysm of iliac artery: Secondary | ICD-10-CM

## 2013-09-27 DIAGNOSIS — R0989 Other specified symptoms and signs involving the circulatory and respiratory systems: Secondary | ICD-10-CM

## 2013-10-25 ENCOUNTER — Other Ambulatory Visit: Payer: Self-pay | Admitting: *Deleted

## 2013-10-25 MED ORDER — METOPROLOL TARTRATE 25 MG PO TABS: 25.0000 mg | ORAL_TABLET | Freq: Two times a day (BID) | ORAL | Status: DC

## 2013-10-31 ENCOUNTER — Encounter: Payer: Self-pay | Admitting: Vascular Surgery

## 2013-11-01 ENCOUNTER — Ambulatory Visit (HOSPITAL_COMMUNITY)
Admission: RE | Admit: 2013-11-01 | Discharge: 2013-11-01 | Disposition: A | Discharge status: Home / Self Care | Payer: Medicare Other | Source: Ambulatory Visit | Attending: Vascular Surgery | Admitting: Vascular Surgery

## 2013-11-01 ENCOUNTER — Encounter: Payer: Self-pay | Admitting: Vascular Surgery

## 2013-11-01 ENCOUNTER — Ambulatory Visit (INDEPENDENT_AMBULATORY_CARE_PROVIDER_SITE_OTHER): Payer: Medicare Other | Admitting: Vascular Surgery

## 2013-11-01 VITALS — BP 139/68 | HR 65 | Ht 69.0 in | Wt 172.1 lb

## 2013-11-01 DIAGNOSIS — I723 Aneurysm of iliac artery: Secondary | ICD-10-CM

## 2013-11-01 DIAGNOSIS — I77819 Aortic ectasia, unspecified site: Secondary | ICD-10-CM

## 2013-11-01 DIAGNOSIS — I714 Abdominal aortic aneurysm, without rupture, unspecified: Secondary | ICD-10-CM

## 2013-11-01 DIAGNOSIS — R0989 Other specified symptoms and signs involving the circulatory and respiratory systems: Secondary | ICD-10-CM

## 2013-11-01 NOTE — Progress Notes (Signed)
    Established Abdominal Aortic Aneurysm  History of Present Illness  The patient is a 80 y.o. (1921-07-08) male who presents with chief complaint: follow up for AAA and iliac artery aneurysm.  Previous studies demonstrate an AAA, measuring 3.2 cm.  The patient does not have back or abdominal pain.  The patient is not a smoker.  He does note some discomfort behind his R knee.  He denies any recent traumas.  The patient's PMH, PSH, SH, FamHx, Med, and Allergies are unchanged from 05/03/13.  On ROS today: no gangrene or ulcers on feet, no rest pain or intermittent claudication  Physical Examination  Filed Vitals:   11/01/13 1025  BP: 139/68  Pulse: 65  Height: 5\' 9"  (1.753 m)  Weight: 172 lb 1.6 oz (78.064 kg)  SpO2: 100%   Body mass index is 25.4 kg/(m^2).  General: A&O x 3, WDWN  Pulmonary: Sym exp, good air movt, CTAB, no rales, rhonchi, & wheezing  Cardiac: RRR, Nl S1, S2, no Murmurs, rubs or gallops  Vascular: Vessel Right Left  Radial Palpable Palpable  Brachial Palpable Palpable  Carotid Palpable, without bruit Palpable, without bruit  Aorta Not palpable N/A  Femoral Palpable Palpable  Popliteal Not palpable Not palpable  PT Palpable Palpable  DP Palpable Palpable   Gastrointestinal: soft, NTND, -G/R, - HSM, - masses, - CVAT B  Musculoskeletal: M/S 5/5 throughout , Extremities without ischemic changes , R knee has fullness behind it, no evidence of R knee instability, +grind on PROM  Neurologic: Pain and light touch intact in extremities , Motor exam as listed above  Non-Invasive Vascular Imaging  Aortoiliac duplex (11/01/2013)  AAA: 3.1 cm x 3.1 cm  R CIA: 2.5 cm x 2.5 cm  L CIA: 1.5 cm x 1.4 cm  BLE arterial duplex (11/01/2013)   R pop: 5.9 mm x 5.6 mm  L pop: 6.7 mm x 6.9 mm  7 cm fluid collection in posterior R knee  Popliteal vein 9.9 mm  Medical Decision Making  The patient is a 80 y.o. male who presents with: aymptomatic AAA with stable  size, stable size B CIA, R posterior knee fluid collection   I doubt that the fluid collection behind the R knee is related to a vascular etiology.  I would refer him to Orthopedics for further evaluation +/- drainage.  It is no clear to me whether the R pop. vein dilation is due compression by the fluid collection.  Will need to keep an eye on this if it does not resolve once the fluid in the R posterior knee is addressed.  Based on this patient's exam and diagnostic studies, he needs q6 month Aortoiliac duplex.  The threshold for repair is AAA size > 5.5 cm, growth > 1 cm/yr, and symptomatic status.  The patient will follow up in 6 months with the above study.    I emphasized the importance of maximal medical management including strict control of blood pressure, blood glucose, and lipid levels, antiplatelet agents, obtaining regular exercise, and cessation of smoking.    Thank you for allowing [place] to participate in this patient's care.  Adele Barthel, MD [facility] [place] Office: [phone] Pager: [phone]  11/01/2013, 5:58 PM

## 2013-11-04 NOTE — Addendum Note (Signed)
Addended by: Reola Calkins on: 11/04/2013 02:27 PM   Modules accepted: Orders

## 2013-12-26 ENCOUNTER — Encounter: Payer: Self-pay | Admitting: Cardiovascular Disease

## 2014-04-17 ENCOUNTER — Other Ambulatory Visit: Payer: Self-pay | Admitting: *Deleted

## 2014-04-17 DIAGNOSIS — I714 Abdominal aortic aneurysm, without rupture, unspecified: Secondary | ICD-10-CM

## 2014-04-17 DIAGNOSIS — I723 Aneurysm of iliac artery: Secondary | ICD-10-CM

## 2014-04-29 ENCOUNTER — Encounter: Payer: Self-pay | Admitting: Gastroenterology

## 2014-05-08 ENCOUNTER — Encounter: Payer: Self-pay | Admitting: Vascular Surgery

## 2014-05-09 ENCOUNTER — Encounter: Payer: Self-pay | Admitting: Vascular Surgery

## 2014-05-09 ENCOUNTER — Ambulatory Visit (HOSPITAL_COMMUNITY)
Admission: RE | Admit: 2014-05-09 | Discharge: 2014-05-09 | Disposition: A | Discharge status: Home / Self Care | Payer: Medicare Other | Source: Ambulatory Visit | Attending: Vascular Surgery | Admitting: Vascular Surgery

## 2014-05-09 ENCOUNTER — Ambulatory Visit (INDEPENDENT_AMBULATORY_CARE_PROVIDER_SITE_OTHER): Payer: Medicare Other | Admitting: Vascular Surgery

## 2014-05-09 VITALS — BP 130/67 | HR 56 | Ht 69.0 in | Wt 168.6 lb

## 2014-05-09 DIAGNOSIS — I723 Aneurysm of iliac artery: Secondary | ICD-10-CM

## 2014-05-09 DIAGNOSIS — I714 Abdominal aortic aneurysm, without rupture, unspecified: Secondary | ICD-10-CM

## 2014-05-09 NOTE — Progress Notes (Signed)
    Established Abdominal Aortic Aneurysm  History of Present Illness  The patient is a 80 y.o. (04-05-1921) male who presents with chief complaint: follow up for AAA.  Previous studies demonstrate an AAA, measuring 3.1 cm.  The patient does not have back or abdominal pain.  The patient is not a smoker.  He has not had an embolic sx to either foot  The patient's PMH, PSH, SH, FamHx, Med, and Allergies are unchanged from 11/01/13.  On ROS today: no claudication, no splinter hemorrhages or ischemic changes of any toes  Physical Examination  Filed Vitals:   05/09/14 0911  BP: 130/67  Pulse: 56  Height: 5\' 9"  (1.753 m)  Weight: 168 lb 9.6 oz (76.476 kg)  SpO2: 98%   Body mass index is 24.89 kg/(m^2).  General: A&O x 3, WDWN  Pulmonary: Sym exp, good air movt, CTAB, no rales, rhonchi, & wheezing  Cardiac: RRR, Nl S1, S2, no Murmurs, rubs or gallops  Vascular: Vessel Right Left  Radial Palpable Palpable  Brachial Palpable Palpable  Carotid Palpable, without bruit Palpable, without bruit  Aorta Not palpable N/A  Femoral Palpable Palpable  Popliteal Not palpable Not palpable  PT Faintly Palpable Faintly Palpable  DP Not examined Not examined   Gastrointestinal: soft, NTND, -G/R, - HSM, - masses, - CVAT B  Musculoskeletal: M/S 5/5 throughout , Extremities without ischemic changes   Neurologic: Pain and light touch intact in extremities , Motor exam as listed above  Non-Invasive Vascular Imaging  AAA Duplex (05/09/2014)  Previous size: 3.1 cm (Date: 11/01/13)  Current size:  323 cm x 3.18 cm (Date: 05/09/2014 )  R CIA: 2.50 cm x 2.41 cm  L CIA: 1.09 cm x 1.07 cm  Medical Decision Making  The patient is a 80 y.o. male who presents with: asymptomatic AAA with stable size, B CIA aneurysm with stable size   Based on this patient's exam and diagnostic studies, he needs annual Aortoiliac duplex to evaluate the AAA and B CIA aneurysms.  The threshold for repair is AAA size  > 5.5 cm, growth > 1 cm/yr, CIA size > 3.5 cm and symptomatic status.  I emphasized the importance of maximal medical management including strict control of blood pressure, blood glucose, and lipid levels, antiplatelet agents, obtaining regular exercise, and cessation of smoking.    I suspect this patient will never need intervention at his age without essentially no change in aneurysmmal size.  Thank you for allowing [place] to participate in this patient's care.  Adele Barthel, MD [facility] [place] Office: [phone] Pager: [phone]  05/09/2014, 9:32 AM

## 2014-05-09 NOTE — Addendum Note (Signed)
Addended by: Mena Goes on: 05/09/2014 04:31 PM   Modules accepted: Orders

## 2014-06-19 ENCOUNTER — Encounter: Payer: Self-pay | Admitting: Podiatrist

## 2014-06-19 ENCOUNTER — Ambulatory Visit (INDEPENDENT_AMBULATORY_CARE_PROVIDER_SITE_OTHER): Payer: Medicare Other | Admitting: Podiatrist

## 2014-06-19 VITALS — BP 73/42 | HR 70 | Resp 13 | Ht 69.5 in | Wt 170.0 lb

## 2014-06-19 DIAGNOSIS — B351 Tinea unguium: Secondary | ICD-10-CM

## 2014-06-19 DIAGNOSIS — M79676 Pain in unspecified toe(s): Secondary | ICD-10-CM

## 2014-06-19 NOTE — Progress Notes (Signed)
   Subjective:    Patient ID: Patrick [name], male    DOB: 1921-03-18, 80 y.o.   MRN: [id number]  HPI Comments: Pt requests debridement of 10 thickened, elongated toenails.  Last visit 01/11/2013.     Review of Systems  All other systems reviewed and are negative.      Objective:   Physical Exam Neurovascular status intact and unchanged.  Toenails are long, thick, discolored, distrophic and mycotic x 10 they are painful and symptomatic.         Assessment & Plan:  Symptomatic mycotic toenails  Plan:  Debridement carried out without complication. He will be seen back in the future as needed.

## 2014-06-19 NOTE — Patient Instructions (Signed)

## 2014-08-08 ENCOUNTER — Encounter: Payer: Self-pay | Admitting: Cardiovascular Disease

## 2014-08-08 ENCOUNTER — Ambulatory Visit (INDEPENDENT_AMBULATORY_CARE_PROVIDER_SITE_OTHER): Payer: Medicare Other | Admitting: Cardiovascular Disease

## 2014-08-08 VITALS — BP 104/68 | HR 64 | Ht 69.5 in | Wt 173.8 lb

## 2014-08-08 DIAGNOSIS — I482 Chronic atrial fibrillation, unspecified: Secondary | ICD-10-CM

## 2014-08-08 DIAGNOSIS — I10 Essential (primary) hypertension: Secondary | ICD-10-CM

## 2014-08-08 DIAGNOSIS — I714 Abdominal aortic aneurysm, without rupture, unspecified: Secondary | ICD-10-CM

## 2014-08-08 NOTE — Patient Instructions (Signed)
Your physician recommends that you continue on your current medications as directed. Please refer to the Current Medication list given to you today.  Your physician wants you to follow-up in: 1 year with Dr. Nahser.  You will receive a reminder letter in the mail two months in advance. If you don't receive a letter, please call our office to schedule the follow-up appointment.  

## 2014-08-08 NOTE — Assessment & Plan Note (Signed)
Garrick is doing well . Continue current meds.  Continue coumadin Rate is well controlled.

## 2014-08-08 NOTE — Assessment & Plan Note (Signed)
Followed by Dr. Bridgett Larsson. Stable

## 2014-08-08 NOTE — Progress Notes (Signed)
Patrick [name] Date of Birth  03/22/1921 [facility] / [facility] [address]     [place], [place]  [address] [phone]  Fax  [phone]  Problems: 1. Atrial Fibrillation 2. Hyperlipidemia 3. Hypertension   History of Present Illness:  80 yo gentleman with a hx of atrial fibrillation.  It's his birthday today.  He is still fairly active.   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  He worked at [employer] garden this past summer.   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  Melquiades presents today for his one-year visit. He's not had any problems.  He's on chronic Coumadin for his atrial fibrillation. His INR levels are checked by the Scientist, product/process development.    Dec. 10, 2014:  Patrick [name] is doing well.  It is his birthday today.  He is able to below his normal activities without any significant problems. He has chronic atrial fibrillation. He's on Coumadin and his INR levels are checked at the medical Oceanographer.  Dec. 11, 2015:  Patrick [name] is a 80 yo with hx of A-fib and HTN.   His Birthday was yesterday. Still active.  Mows the lawn.  Works in his garden in the spring time   Past Medical History  Diagnosis Date  . Atrial fibrillation   . Hypertension   . Hypercholesterolemia   . Arthritis   . AAA (abdominal aortic aneurysm)   . Adenomatous colon polyp 2004    TVA 04/2006  . Diverticulosis   . Hemorrhoids      Current Outpatient Prescriptions on File Prior to Visit  Medication Sig Dispense Refill  . atorvastatin (LIPITOR) 80 MG tablet Take 80 mg by mouth daily.      . Calcium Carbonate-Vitamin D (CALTRATE 600+D PO) Take 600 mg by mouth 2 (two) times daily.      Marland Kitchen docusate sodium (COLACE) 100 MG capsule Take 100 mg by mouth 2 (two) times daily.    . Glucosamine-Chondroit-Vit C-Mn CAPS Take by mouth.      . hydroxypropyl methylcellulose (ISOPTO TEARS) 2.5 % ophthalmic solution Place 1 drop  into both eyes as needed.      Marland Kitchen lisinopril (PRINIVIL,ZESTRIL) 5 MG tablet Take 5 mg by mouth daily.      . metoprolol tartrate (LOPRESSOR) 25 MG tablet Take 1 tablet (25 mg total) by mouth 2 (two) times daily. 180 tablet 3  . METRONIDAZOLE, TOPICAL, 0.75 % LOTN Take 0.75 mg by mouth 2 (two) times daily. Take one tablet BID    . Multiple Vitamin (MULTIVITAMIN PO) Take by mouth daily.     . Omega-3 Fatty Acids (FISH OIL) 1200 MG CAPS Take by mouth daily.     Vladimir Faster Glycol-Propyl Glycol (SYSTANE OP) Apply to eye 2 (two) times daily.      . Polyethylene Glycol 3350 (MIRALAX PO) Take by mouth 2 (two) times a week.      . triamterene-hydrochlorothiazide (MAXZIDE-25) 37.5-25 MG per tablet Take 1 tablet by mouth daily. 90 tablet 3  . Warfarin Sodium (COUMADIN PO) Take by mouth. Take as Directed      No current facility-administered medications on file prior to visit.    No Known Allergies  Past Medical History  Diagnosis Date  . Atrial fibrillation   . Hypertension   . Hypercholesterolemia   . Arthritis   . AAA (abdominal aortic aneurysm)   . Adenomatous colon polyp  2004    TVA 04/2006  . Diverticulosis   . Hemorrhoids     Past Surgical History  Procedure Laterality Date  . Hernia repair  Age 56  . Tonsillectomy      Very Young    History  Smoking status  . Former Smoker  . Types: Cigarettes  . Quit date: 08/29/1969  Smokeless tobacco  . Never Used    History  Alcohol Use  . Yes    Comment: glass of wine 1-2 times per week    Family History  Problem Relation Age of Onset  . Emphysema Father     Reviw of Systems:  Reviewed in the HPI.  All other systems are negative.  Physical Exam: BP 104/68 mmHg  Pulse 64  Ht 5' 9.5" (1.765 m)  Wt 173 lb 12.8 oz (78.835 kg)  BMI 25.31 kg/m2 The patient is alert and oriented x 3.  The mood and affect are normal.   Skin: warm and dry.  Color is normal.    HEENT:   the sclera are nonicteric.  The mucous membranes are  moist.  The carotids are 2+ without bruits.  There is no thyromegaly.  There is no JVD.    Lungs: clear.  The chest wall is non tender.    Heart: Irregular rate with a normal S1 and S2.  There are no murmurs, gallops, or rubs. The PMI is not displaced.     Abdomen: good bowel sounds.  There is no guarding or rebound.  There is no hepatosplenomegaly or tenderness.  There are no masses.   Extremities:  no clubbing, cyanosis, or edema.  The legs are without rashes.  The distal pulses are intact.   Neuro:  Cranial nerves II - XII are intact.  Motor and sensory functions are intact.    The gait is normal.  ECG: Dec. 11, 2015:  Atrial fib with rate of 64.  Inc. RBBB.  , no changes.   Assessment / Plan:

## 2014-08-08 NOTE — Assessment & Plan Note (Signed)
BP is well controlled. Continue meds  followed by Dr. Joylene Draft

## 2014-10-06 ENCOUNTER — Other Ambulatory Visit: Payer: Self-pay | Admitting: *Deleted

## 2014-10-06 MED ORDER — TRIAMTERENE-HCTZ 37.5-25 MG PO TABS: 1.0000 | ORAL_TABLET | Freq: Every day | ORAL | Status: DC

## 2014-10-27 ENCOUNTER — Other Ambulatory Visit: Payer: Self-pay | Admitting: Cardiovascular Disease

## 2015-01-15 ENCOUNTER — Encounter: Payer: Self-pay | Admitting: Cardiovascular Disease

## 2015-01-19 ENCOUNTER — Telehealth: Payer: Self-pay | Admitting: Cardiovascular Disease

## 2015-01-19 NOTE — Telephone Encounter (Signed)
Peter Congo calls from Dr. Silvestre Mesi office regarding appointment for patient with Dr. Acie Fredrickson or associate for worsening shortness of breath. States pt needs appointment this week as per Dr. Joylene Draft.  Call back number is : [phone] Peter Congo  Forwarded to [employer] RN Horton Chin RN

## 2015-01-19 NOTE — Telephone Encounter (Signed)
New Message   Pt c/o Shortness Of Breath: STAT if SOB developed within the last 24 hours or pt is noticeably SOB on the phone  1. Are you currently SOB (can you hear that pt is SOB on the phone)? Yes. And it is progressing 2. How long have you been experiencing SOB? Within 72 Hours  3. Are you SOB when sitting or when up moving around? Exertion, when he is moving around 4.  Are you currently experiencing any other symptoms? a cough or periphial edema and nothing else that she knows of.

## 2015-01-19 NOTE — Telephone Encounter (Signed)
I called and spoke with patient who states he is experiencing worsening dyspnea on exertion and fatigue.  He states he was advised by Dr. Silvestre Mesi office to follow-up with Dr. Acie Fredrickson for elevated BNP and heart failure.  Patient's BNP on 5/17 with [facility] was 280 pg/mL.  I advised patient that Dr. Acie Fredrickson has an opening on Wednesday at 2:45 and patient is in agreement to come in for evaluation at that time.  I called Peter Congo at [facility] and advised her of appointment; she thanked me for the call.

## 2015-01-20 NOTE — Progress Notes (Signed)
Cardiology Office Note   Date:  01/20/2015   ID:  CHUKWUMA STRAUS, DOB [birth year], MRN [id number]  PCP:  Jerlyn Ly, MD  Cardiologist:   Thayer Headings, MD   Chief Complaint  Patient presents with  . Atrial Fibrillation   1. Atrial Fibrillation 2. Hyperlipidemia 3. Hypertension   History of Present Illness:  81 yo gentleman with a hx of atrial fibrillation. It's his birthday today. He is still fairly active. 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 He worked at [employer] garden this past summer.   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  Shivank presents today for his one-year visit. He's not had any problems. He's on chronic Coumadin for his atrial fibrillation. His INR levels are checked by the Scientist, product/process development.   Dec. 10, 2014:  Jayko is doing well. It is his birthday today. He is able to below his normal activities without any significant problems. He has chronic atrial fibrillation. He's on Coumadin and his INR levels are checked at the medical Oceanographer.  Dec. 11, 2015:  Dudley is a 81 yo with hx of A-fib and HTN.  His Birthday was yesterday. Still active. Mows the lawn. Works in his garden in the spring time    Jan 21, 2015 Shlomie Romig Ditullio is a 81 y.o. male who presents for evaluation of atrial fib and fatigue .  He has had more fatigue renctly  His normal activities seem to tire him fairly quickly. No CP , no dyspnea .  Still wears his compression hose.     Past Medical History  Diagnosis Date  . Atrial fibrillation   . Hypertension   . Hypercholesterolemia   . Arthritis   . AAA (abdominal aortic aneurysm)   . Adenomatous colon polyp 2004    TVA 04/2006  . Diverticulosis   . Hemorrhoids     Past Surgical History  Procedure Laterality Date  . Hernia repair  Age 49  . Tonsillectomy      Very Young     Current Outpatient Prescriptions  Medication Sig Dispense Refill  .  atorvastatin (LIPITOR) 80 MG tablet Take 80 mg by mouth daily.      . Calcium Carbonate-Vitamin D (CALTRATE 600+D PO) Take 600 mg by mouth 2 (two) times daily.      Marland Kitchen docusate sodium (COLACE) 100 MG capsule Take 100 mg by mouth 2 (two) times daily.    . Glucosamine-Chondroit-Vit C-Mn CAPS Take by mouth.      . hydroxypropyl methylcellulose (ISOPTO TEARS) 2.5 % ophthalmic solution Place 1 drop into both eyes as needed.      Marland Kitchen lisinopril (PRINIVIL,ZESTRIL) 5 MG tablet Take 5 mg by mouth daily.      . metoprolol tartrate (LOPRESSOR) 25 MG tablet TAKE 1 BY MOUTH TWICE DAILY 180 tablet 3  . METRONIDAZOLE, TOPICAL, 0.75 % LOTN Take 0.75 mg by mouth 2 (two) times daily. Take one tablet BID    . Multiple Vitamin (MULTIVITAMIN PO) Take by mouth daily.     . Omega-3 Fatty Acids (FISH OIL) 1200 MG CAPS Take by mouth daily.     Vladimir Faster Glycol-Propyl Glycol (SYSTANE OP) Apply to eye 2 (two) times daily.      . Polyethylene Glycol 3350 (MIRALAX PO) Take by mouth 2 (two) times a week.      . triamterene-hydrochlorothiazide (MAXZIDE-25) 37.5-25 MG per tablet Take 1 tablet by mouth daily.  90 tablet 3  . Warfarin Sodium (COUMADIN PO) Take by mouth. Take as Directed      No current facility-administered medications for this visit.    Allergies:   Review of patient's allergies indicates no known allergies.    Social History:  The patient  reports that he quit smoking about 45 years ago. His smoking use included Cigarettes. He has never used smokeless tobacco. He reports that he drinks alcohol. He reports that he does not use illicit drugs.   Family History:  The patient's family history includes Emphysema in his father.    ROS:  Please see the history of present illness.    Review of Systems: Constitutional:  denies fever, chills, diaphoresis, appetite change and fatigue.  HEENT: denies photophobia, eye pain, redness, hearing loss, ear pain, congestion, sore throat, rhinorrhea, sneezing, neck pain,  neck stiffness and tinnitus.  Respiratory: denies SOB, DOE, cough, chest tightness, and wheezing.  Cardiovascular: denies chest pain, palpitations and leg swelling.  Gastrointestinal: denies nausea, vomiting, abdominal pain, diarrhea, constipation, blood in stool.  Genitourinary: denies dysuria, urgency, frequency, hematuria, flank pain and difficulty urinating.  Musculoskeletal: denies  myalgias, back pain, joint swelling, arthralgias and gait problem.   Skin: denies pallor, rash and wound.  Neurological: denies dizziness, seizures, syncope, weakness, light-headedness, numbness and headaches.   Hematological: denies adenopathy, easy bruising, personal or family bleeding history.  Psychiatric/ Behavioral: denies suicidal ideation, mood changes, confusion, nervousness, sleep disturbance and agitation.       All other systems are reviewed and negative.    PHYSICAL EXAM: VS:  There were no vitals taken for this visit. , BMI There is no weight on file to calculate BMI. GEN: Well nourished, well developed, in no acute distress HEENT: normal Neck: no JVD, carotid bruits, or masses Cardiac: RRR; no murmurs, rubs, or gallops,no edema  Respiratory:  clear to auscultation bilaterally, normal work of breathing GI: soft, nontender, nondistended, + BS MS: no deformity or atrophy Skin: warm and dry, no rash Neuro:  Strength and sensation are intact Psych: normal   EKG:  EKG is ordered today. The ekg ordered today demonstrates : atrial fib at 84.  No St or T wave changes.    Recent Labs: No results found for requested labs within last 365 days.    Lipid Panel No results found for: CHOL, TRIG, HDL, CHOLHDL, VLDL, LDLCALC, LDLDIRECT    Wt Readings from Last 3 Encounters:  08/08/14 78.835 kg (173 lb 12.8 oz)  06/19/14 77.111 kg (170 lb)  05/09/14 76.476 kg (168 lb 9.6 oz)      Other studies Reviewed: Additional studies/ records that were reviewed today include: . Review of the above  records demonstrates:    ASSESSMENT AND PLAN:  1. Atrial Fibrillation - his rate is well-controlled. He's having more fatigue. This might be due to the fact that he is 89. I would like to get an echocardiogram  to ensure that his left ventricular function still fairly normal.  We will have him double his Lasix and potassium for the next 2-3 days and he'll call us next week to see whether he feels any better or if he feels worse. If we increase his Lasix and potassium on a long-term basis I will have him return in one month for a basic medical profile.  2. Hyperlipidemia - followed by Dr. Joylene Draft. 3. Hypertension- blood pressure is well-controlled.   Current medicines are reviewed at length with the patient today.  The patient does not  have concerns regarding medicines.  The following changes have been made:  no change  Labs/ tests ordered today include:  No orders of the defined types were placed in this encounter.     Disposition:   FU with me in 3 months      Nahser, Wonda Cheng, MD  01/20/2015 6:02 PM    [facility] [place], [place], [place]  [address] Phone: [phone]; Fax: [phone]   [facility]  [address] [place], [place]  [address] [phone]    Fax [phone]

## 2015-01-21 ENCOUNTER — Ambulatory Visit (INDEPENDENT_AMBULATORY_CARE_PROVIDER_SITE_OTHER): Payer: Medicare Other | Admitting: Cardiovascular Disease

## 2015-01-21 ENCOUNTER — Encounter: Payer: Self-pay | Admitting: Cardiovascular Disease

## 2015-01-21 VITALS — BP 118/62 | HR 84 | Ht 69.5 in | Wt 176.0 lb

## 2015-01-21 DIAGNOSIS — I482 Chronic atrial fibrillation, unspecified: Secondary | ICD-10-CM

## 2015-01-21 DIAGNOSIS — I10 Essential (primary) hypertension: Secondary | ICD-10-CM

## 2015-01-21 NOTE — Patient Instructions (Addendum)
  Medication Instructions:  Double your furosemide and potassium chloride which is also called Klor Con for 2-3 days and report back if you better or worse - call Tuesday 5/31 and ask for Triage Nurse   Labwork: None Ordered  Testing/Procedures: Your physician has requested that you have an echocardiogram. Echocardiography is a painless test that uses sound waves to create images of your heart. It provides your doctor with information about the size and shape of your heart and how well your heart's chambers and valves are working. This procedure takes approximately one hour. There are no restrictions for this procedure.   Follow-Up: Your physician recommends that you schedule a follow-up appointment in: 3 months with Dr. Acie Fredrickson

## 2015-01-27 ENCOUNTER — Telehealth: Payer: Self-pay | Admitting: Cardiovascular Disease

## 2015-01-27 ENCOUNTER — Encounter: Payer: Self-pay | Admitting: Cardiovascular Disease

## 2015-01-27 DIAGNOSIS — Z79899 Other long term (current) drug therapy: Secondary | ICD-10-CM

## 2015-01-27 NOTE — Telephone Encounter (Signed)
Follow UP  Pt returning Anne's phone call.

## 2015-01-27 NOTE — Telephone Encounter (Addendum)
Patient is doing much better on double dose of Potassium 20 mEq and Lasix 40 mg daily. Patient was suppose to stop after 2 to 3 days. Patient has not stopped and wants to know if he can continue of this dose or needs to go back to single dose. Informed patient that he was only suppose to take a increased dose for three days.  Patient states his blood pressure is fine. Will forward to Dr. Acie Fredrickson for further instructions.

## 2015-01-27 NOTE — Telephone Encounter (Signed)
Since he is feeling better, he may continue on the higher dose. Lets check BMP in a week.

## 2015-01-27 NOTE — Telephone Encounter (Signed)
LMTCB

## 2015-01-27 NOTE — Telephone Encounter (Signed)
Left message to call back  

## 2015-01-27 NOTE — Telephone Encounter (Signed)
Pt was given some med-to call and let us know if he is better or not--pls call [phone]

## 2015-01-27 NOTE — Telephone Encounter (Signed)
Called patient back. BMP ordered and scheduled for next Tuesday. Informed patient of Dr. Elmarie Shiley response. Patient verbalized understanding.

## 2015-01-29 ENCOUNTER — Other Ambulatory Visit: Payer: Self-pay

## 2015-01-29 ENCOUNTER — Ambulatory Visit (HOSPITAL_COMMUNITY): Payer: Medicare Other | Attending: Cardiovascular Disease

## 2015-01-29 DIAGNOSIS — I517 Cardiomegaly: Secondary | ICD-10-CM | POA: Diagnosis not present

## 2015-01-29 DIAGNOSIS — I700 Atherosclerosis of aorta: Secondary | ICD-10-CM | POA: Insufficient documentation

## 2015-01-29 DIAGNOSIS — I071 Rheumatic tricuspid insufficiency: Secondary | ICD-10-CM | POA: Diagnosis not present

## 2015-01-29 DIAGNOSIS — I340 Nonrheumatic mitral (valve) insufficiency: Secondary | ICD-10-CM | POA: Diagnosis not present

## 2015-01-29 DIAGNOSIS — I482 Chronic atrial fibrillation, unspecified: Secondary | ICD-10-CM

## 2015-02-03 ENCOUNTER — Other Ambulatory Visit (INDEPENDENT_AMBULATORY_CARE_PROVIDER_SITE_OTHER): Payer: Medicare Other | Admitting: *Deleted

## 2015-02-03 DIAGNOSIS — Z79899 Other long term (current) drug therapy: Secondary | ICD-10-CM | POA: Diagnosis not present

## 2015-02-03 LAB — BASIC METABOLIC PANEL
BUN: 37 mg/dL — ABNORMAL HIGH (ref 6–23)
CHLORIDE: 107 meq/L (ref 96–112)
CO2: 29 mEq/L (ref 19–32)
Calcium: 9.6 mg/dL (ref 8.4–10.5)
Creatinine, Ser: 1.08 mg/dL (ref 0.40–1.50)
GFR: 68.04 mL/min (ref >60.00)
Glucose, Bld: 169 mg/dL — ABNORMAL HIGH (ref 70–99)
Potassium: 4.1 mEq/L (ref 3.5–5.1)
SODIUM: 141 meq/L (ref 135–145)

## 2015-03-24 ENCOUNTER — Encounter: Payer: Self-pay | Admitting: Podiatry

## 2015-03-24 ENCOUNTER — Ambulatory Visit (INDEPENDENT_AMBULATORY_CARE_PROVIDER_SITE_OTHER): Payer: Medicare Other | Admitting: Podiatry

## 2015-03-24 DIAGNOSIS — M79676 Pain in unspecified toe(s): Secondary | ICD-10-CM | POA: Diagnosis not present

## 2015-03-24 DIAGNOSIS — B351 Tinea unguium: Secondary | ICD-10-CM

## 2015-03-24 NOTE — Progress Notes (Signed)
Patient ID: Patrick [name], male   DOB: 10-15-1920, 81 y.o.   MRN: [id number] Complaint:  Visit Type: Patient returns to my office for continued preventative foot care services. Complaint: Patient states" my nails have grown long and thick and become painful to walk and wear shoes" . The patient presents for preventative foot care services. No changes to ROS  Podiatric Exam: Vascular: dorsalis pedis and posterior tibial pulses are palpable bilateral. Capillary return is immediate. Temperature gradient is WNL. Skin turgor WNL .  Purplish discoloration both feet. Sensorium: Normal Semmes Weinstein monofilament test. Normal tactile sensation bilaterally. Nail Exam: Pt has thick disfigured discolored nails with subungual debris noted bilateral entire nail hallux through fifth toenails Ulcer Exam: There is no evidence of ulcer or pre-ulcerative changes or infection. Orthopedic Exam: Muscle tone and strength are WNL. No limitations in general ROM. No crepitus or effusions noted. Foot type and digits show no abnormalities. Bony prominences are unremarkable. Skin: No Porokeratosis. No infection or ulcers  Diagnosis:  Onychomycosis, , Pain in right toe, pain in left toes  Treatment & Plan Procedures and Treatment: Consent by patient was obtained for treatment procedures. The patient understood the discussion of treatment and procedures well. All questions were answered thoroughly reviewed. Debridement of mycotic and hypertrophic toenails, 1 through 5 bilateral and clearing of subungual debris. No ulceration, no infection noted.  Return Visit-Office Procedure: Patient instructed to return to the office for a follow up visit 3 months for continued evaluation and treatment.

## 2015-04-06 ENCOUNTER — Ambulatory Visit (INDEPENDENT_AMBULATORY_CARE_PROVIDER_SITE_OTHER): Payer: Medicare Other | Admitting: Cardiovascular Disease

## 2015-04-06 ENCOUNTER — Encounter: Payer: Self-pay | Admitting: Cardiovascular Disease

## 2015-04-06 VITALS — BP 90/58 | HR 51 | Ht 69.5 in | Wt 163.5 lb

## 2015-04-06 DIAGNOSIS — I482 Chronic atrial fibrillation, unspecified: Secondary | ICD-10-CM

## 2015-04-06 DIAGNOSIS — I10 Essential (primary) hypertension: Secondary | ICD-10-CM | POA: Diagnosis not present

## 2015-04-06 NOTE — Patient Instructions (Signed)
Medication Instructions:  Your physician recommends that you continue on your current medications as directed. Please refer to the Current Medication list given to you today.   Labwork: None Ordered   Testing/Procedures: None Ordered   Follow-Up: Your physician wants you to follow-up in: 6 months with Dr. Nahser.  You will receive a reminder letter in the mail two months in advance. If you don't receive a letter, please call our office to schedule the follow-up appointment.     

## 2015-04-06 NOTE — Progress Notes (Signed)
Cardiology Office Note   Date:  04/06/2015   ID:  Patrick [name], DOB 04-10-1921, MRN [id number]  PCP:  Patrick Ly, MD  Cardiologist:   Patrick Headings, MD   Chief Complaint  Patient presents with  . Atrial Fibrillation   1. Atrial Fibrillation 2. Hyperlipidemia 3. Hypertension   History of Present Illness:  81 yo gentleman with a hx of atrial fibrillation. It's his birthday today. He is still fairly active. 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 He worked at [employer] garden this past summer.   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  Patrick [name] presents today for his one-year visit. He's not had any problems. He's on chronic Coumadin for his atrial fibrillation. His INR levels are checked by the Scientist, product/process development.   Dec. 10, 2014:  [name] is doing well. It is his birthday today. He is able to below his normal activities without any significant problems. He has chronic atrial fibrillation. He's on Coumadin and his INR levels are checked at the medical Oceanographer.  Dec. 11, 2015:  Patrick [name] is a 81 yo with hx of A-fib and HTN.  His Birthday was yesterday. Still active. Mows the lawn. Works in his garden in the spring time    Jan 21, 2015 Patrick [name] is a 81 y.o. male who presents for evaluation of atrial fib and fatigue .  He has had more fatigue renctly  His normal activities seem to tire him fairly quickly. No CP , no dyspnea .  Still wears his compression hose.   April 06, 2015:  Patrick [name] is doing well. BP has been running normal. - a bit low   Past Medical History  Diagnosis Date  . Atrial fibrillation   . Hypertension   . Hypercholesterolemia   . Arthritis   . AAA (abdominal aortic aneurysm)   . Adenomatous colon polyp 2004    TVA 04/2006  . Diverticulosis   . Hemorrhoids     Past Surgical History  Procedure Laterality Date  . Hernia repair  Age 69  . Tonsillectomy      Very Young      Current Outpatient Prescriptions  Medication Sig Dispense Refill  . atorvastatin (LIPITOR) 80 MG tablet Take 80 mg by mouth daily.      . Calcium Carbonate-Vitamin D (CALTRATE 600+D PO) Take 600 mg by mouth 2 (two) times daily.      . furosemide (LASIX) 20 MG tablet Take 40 mg by mouth daily.  11  . hydroxypropyl methylcellulose (ISOPTO TEARS) 2.5 % ophthalmic solution Place 1 drop into both eyes as needed.      Marland Kitchen KLOR-CON M10 10 MEQ tablet Take 20 mEq by mouth daily.  11  . lisinopril (PRINIVIL,ZESTRIL) 5 MG tablet Take 5 mg by mouth daily.      . metoprolol (LOPRESSOR) 50 MG tablet TAKE 1 TAB BY MOUTH TWICE A DAY  11  . METRONIDAZOLE, TOPICAL, 0.75 % LOTN Take 0.75 mg by mouth 2 (two) times daily. Take one tablet BID    . mupirocin ointment (BACTROBAN) 2 % Apply topically 2 (two) times daily.  0  . Omega-3 Fatty Acids (FISH OIL) 1200 MG CAPS Take by mouth daily.     Patrick [name] Glycol-Propyl Glycol (SYSTANE OP) Apply to eye 2 (two) times daily.      . Polyethylene Glycol 3350 (MIRALAX PO) Take by mouth 2 (two) times a week.      Marland Kitchen  warfarin (COUMADIN) 2 MG tablet   2  . Warfarin Sodium (COUMADIN PO) Take by mouth. Take as Directed     . metoprolol tartrate (LOPRESSOR) 25 MG tablet TAKE 1 BY MOUTH TWICE DAILY (Patient not taking: Reported on 04/06/2015) 180 tablet 3   No current facility-administered medications for this visit.    Allergies:   Review of patient's allergies indicates no known allergies.    Social History:  The patient  reports that he quit smoking about 45 years ago. His smoking use included Cigarettes. He has never used smokeless tobacco. He reports that he drinks alcohol. He reports that he does not use illicit drugs.   Family History:  The patient's family history includes Emphysema in his father.    ROS:  Please see the history of present illness.    Review of Systems: Constitutional:  denies fever, chills, diaphoresis, appetite change and fatigue.  HEENT:  denies photophobia, eye pain, redness, hearing loss, ear pain, congestion, sore throat, rhinorrhea, sneezing, neck pain, neck stiffness and tinnitus.  Respiratory: denies SOB, DOE, cough, chest tightness, and wheezing.  Cardiovascular: denies chest pain, palpitations and leg swelling.  Gastrointestinal: denies nausea, vomiting, abdominal pain, diarrhea, constipation, blood in stool.  Genitourinary: denies dysuria, urgency, frequency, hematuria, flank pain and difficulty urinating.  Musculoskeletal: denies  myalgias, back pain, joint swelling, arthralgias and gait problem.   Skin: denies pallor, rash and wound.  Neurological: denies dizziness, seizures, syncope, weakness, light-headedness, numbness and headaches.   Hematological: denies adenopathy, easy bruising, personal or family bleeding history.  Psychiatric/ Behavioral: denies suicidal ideation, mood changes, confusion, nervousness, sleep disturbance and agitation.       All other systems are reviewed and negative.    PHYSICAL EXAM: VS:  BP 90/58 mmHg  Pulse 51  Ht 5' 9.5" (1.765 m)  Wt 74.163 kg (163 lb 8 oz)  BMI 23.81 kg/m2  SpO2 96% , BMI Body mass index is 23.81 kg/(m^2). GEN: Well nourished, well developed, in no acute distress HEENT: normal Neck: no JVD, carotid bruits, or masses Cardiac: RRR; no murmurs, rubs, or gallops,no edema  Respiratory:  clear to auscultation bilaterally, normal work of breathing GI: soft, nontender, nondistended, + BS MS: no deformity or atrophy Skin: warm and dry, no rash Neuro:  Strength and sensation are intact Psych: normal   EKG:  EKG is ordered today. The ekg ordered today demonstrates : atrial fib at 84.  No St or T wave changes.    Recent Labs: 02/03/2015: BUN 37*; Creatinine, Ser 1.08; Potassium 4.1; Sodium 141    Lipid Panel No results found for: CHOL, TRIG, HDL, CHOLHDL, VLDL, LDLCALC, LDLDIRECT    Wt Readings from Last 3 Encounters:  04/06/15 74.163 kg (163 lb 8 oz)   01/21/15 79.833 kg (176 lb)  08/08/14 78.835 kg (173 lb 12.8 oz)      Other studies Reviewed: Additional studies/ records that were reviewed today include: . Review of the above records demonstrates:    ASSESSMENT AND PLAN:  1. Atrial Fibrillation - his rate is well-controlled.   Patrick [name] is doing very well. Continue current medications. His heart rate and blood pressure are both fairly low but he's completely asymptomatic. I've asked him to let us know if he has any weakness or dizziness.  will see him Again in 6 month for follow-up visit.   2. Hyperlipidemia - followed by Dr. Joylene Draft. 3. Hypertension- blood pressure is well-controlled.   Current medicines are reviewed at length with the patient today.  The patient does not have concerns regarding medicines.  The following changes have been made:  no change  Labs/ tests ordered today include:  No orders of the defined types were placed in this encounter.     Disposition:   FU with me in 3 months      Patrick [name], Wonda Cheng, MD  04/06/2015 8:49 AM    [facility] [place], [place], [place]  [address] Phone: [phone]; Fax: [phone]   [facility]  [address] [place], [place]  [address] [phone]    Fax [phone]

## 2015-04-27 ENCOUNTER — Ambulatory Visit: Payer: Medicare Other | Admitting: Cardiovascular Disease

## 2015-05-20 ENCOUNTER — Encounter: Payer: Self-pay | Admitting: Family

## 2015-05-22 ENCOUNTER — Encounter: Payer: Self-pay | Admitting: Family

## 2015-05-22 ENCOUNTER — Ambulatory Visit (HOSPITAL_COMMUNITY)
Admission: RE | Admit: 2015-05-22 | Discharge: 2015-05-22 | Disposition: A | Discharge status: Home / Self Care | Payer: Medicare Other | Source: Ambulatory Visit | Attending: Family | Admitting: Family

## 2015-05-22 ENCOUNTER — Ambulatory Visit (INDEPENDENT_AMBULATORY_CARE_PROVIDER_SITE_OTHER): Payer: Medicare Other | Admitting: Family

## 2015-05-22 VITALS — BP 108/60 | HR 50 | Temp 97.5°F | Ht 69.5 in | Wt 159.0 lb

## 2015-05-22 DIAGNOSIS — I10 Essential (primary) hypertension: Secondary | ICD-10-CM | POA: Insufficient documentation

## 2015-05-22 DIAGNOSIS — I714 Abdominal aortic aneurysm, without rupture, unspecified: Secondary | ICD-10-CM

## 2015-05-22 DIAGNOSIS — I708 Atherosclerosis of other arteries: Secondary | ICD-10-CM | POA: Insufficient documentation

## 2015-05-22 DIAGNOSIS — Z87891 Personal history of nicotine dependence: Secondary | ICD-10-CM | POA: Diagnosis not present

## 2015-05-22 DIAGNOSIS — I700 Atherosclerosis of aorta: Secondary | ICD-10-CM | POA: Diagnosis not present

## 2015-05-22 DIAGNOSIS — I7409 Other arterial embolism and thrombosis of abdominal aorta: Secondary | ICD-10-CM

## 2015-05-22 DIAGNOSIS — I723 Aneurysm of iliac artery: Secondary | ICD-10-CM | POA: Diagnosis not present

## 2015-05-22 DIAGNOSIS — E785 Hyperlipidemia, unspecified: Secondary | ICD-10-CM | POA: Insufficient documentation

## 2015-05-22 NOTE — Progress Notes (Signed)
[facility]  Established Abdominal Aortic Aneurysm  History of Present Illness  Patrick [name] is a 81 y.o. (1921-02-24) male patient of Dr. Bridgett Larsson who presents with chief complaint: follow up for AAA. Previous studies demonstrate an AAA, measuring 3.1 cm. The patient does not have back or abdominal pain. The patient is not a smoker. He has not had embolic sx to either foot.  He takes coumadin for atrial fib  The patient denies claudication in legs with walking, denies non healing wounds. The patient denies history of stroke or TIA symptoms.  Pt Diabetic: borderline Pt smoker: former smoker, quit in the 1970's, smoked for about 20 years  Past Medical History  Diagnosis Date  . Atrial fibrillation   . Hypertension   . Hypercholesterolemia   . Arthritis   . AAA (abdominal aortic aneurysm)   . Adenomatous colon polyp 2004    TVA 04/2006  . Diverticulosis   . Hemorrhoids    Past Surgical History  Procedure Laterality Date  . Hernia repair  Age 17  . Tonsillectomy      Very Young   Social History Social History   Social History  . Marital Status: Divorced    Spouse Name: N/A  . Number of Children: 3  . Years of Education: N/A   Occupational History  . retired    Social History Main Topics  . Smoking status: Former Smoker    Types: Cigarettes    Quit date: 08/29/1969  . Smokeless tobacco: Never Used  . Alcohol Use: Yes     Comment: glass of wine 1-2 times per week  . Drug Use: No  . Sexual Activity: Not on file   Other Topics Concern  . Not on file   Social History Narrative   Family History Family History  Problem Relation Age of Onset  . Emphysema Father     Current Outpatient Prescriptions on File Prior to Visit  Medication Sig Dispense Refill  . atorvastatin (LIPITOR) 80 MG tablet Take 80 mg by mouth daily.      . Calcium Carbonate-Vitamin D (CALTRATE 600+D PO) Take 600 mg by mouth 2 (two) times daily.      . furosemide  (LASIX) 20 MG tablet Take 40 mg by mouth daily.  11  . hydroxypropyl methylcellulose (ISOPTO TEARS) 2.5 % ophthalmic solution Place 1 drop into both eyes as needed.      Marland Kitchen KLOR-CON M10 10 MEQ tablet Take 20 mEq by mouth daily.  11  . lisinopril (PRINIVIL,ZESTRIL) 5 MG tablet Take 5 mg by mouth daily.      . metoprolol (LOPRESSOR) 50 MG tablet TAKE 1 TAB BY MOUTH TWICE A DAY  11  . METRONIDAZOLE, TOPICAL, 0.75 % LOTN Take 0.75 mg by mouth 2 (two) times daily. Take one tablet BID    . mupirocin ointment (BACTROBAN) 2 % Apply topically 2 (two) times daily.  0  . Omega-3 Fatty Acids (FISH OIL) 1200 MG CAPS Take by mouth daily.     Vladimir Faster Glycol-Propyl Glycol (SYSTANE OP) Apply to eye 2 (two) times daily.      . Polyethylene Glycol 3350 (MIRALAX PO) Take by mouth 2 (two) times a week.      . warfarin (COUMADIN) 2 MG tablet   2  . Warfarin Sodium (COUMADIN PO) Take by mouth. Take as Directed      No current facility-administered medications on file prior to visit.   No Known Allergies  ROS: See  HPI for pertinent positives and negatives.  Physical Examination  Filed Vitals:   05/22/15 0855  BP: 108/60  Pulse: 50  Temp: 97.5 F (36.4 C)  TempSrc: Oral  Height: 5' 9.5" (1.765 m)  Weight: 159 lb (72.122 kg)  SpO2: 97%   Body mass index is 23.15 kg/(m^2).  General: A&O x 3, WDWN  Pulmonary: Sym exp, good air movt, CTAB, no rales, rhonchi, & wheezing  Cardiac: Irregular rhythm, no detected murmurs.  Vascular: Vessel Right Left  Radial 2+Palpable 2+Palpable  Carotid Palpable, without bruit Palpable, without bruit  Aorta Not palpable N/A  Femoral Palpable Palpable  Popliteal Not palpable Not palpable  PT Not Palpable, audible Doppler signal 1+ Palpable  DP Not palpable, audible Doppler signal Not palpable, no audible Dopplersignal   Gastrointestinal: soft, NTND, -G/R, - HSM, - palpable masses, - CVAT B  Musculoskeletal: M/S 5/5 throughout ,  Extremities without ischemic changes   Neurologic: Pain and light touch intact in extremities , Motor exam as listed above       Non-Invasive Vascular Imaging  AAA Duplex (05/22/2015) ABDOMINAL AORTA DUPLEX EVALUATION    INDICATION: Evaluation of abdominal aorta    PREVIOUS INTERVENTION(S): None    DUPLEX EXAM:     LOCATION DIAMETER AP (cm) DIAMETER TRANSVERSE (cm) VELOCITIES (cm/sec)  Aorta Proximal 2.5 2.5 35  Aorta Mid 2.2 1.8 53  Aorta Distal 3.2 3.0 90  Right Common Iliac Artery 2.5 2.5 495  Left Common Iliac Artery .97 .96 185    Previous max aortic diameter:  3.1 3.1 Date: 11/01/2013     ADDITIONAL FINDINGS:     IMPRESSION: 1. Patent distal abdominal aortic aneurysm measuring 3.2 x 3.0 cms with plaque noted within 2. Right iliac artery measures 2.5 x 2.5 cms with plaque noted within and increased velocity within the 50 - 99% stenosis range    Compared to the previous exam:  Increased right common iliac artery velocity since prior exam     Medical Decision Making  The patient is a 81 y.o. male who presents with asymptomatic small AAA with no increase in size since 11/01/2013. He has possible significant stenosis in the right iliac artery, but has no claudication symptoms, no signs of lower extremity ischemia.   Based on this patient's exam and diagnostic studies, and after discussing with Dr. Bridgett Larsson, the patient will follow up in 3 months with the following studies: bilateral aortoiliac duplex and ABI's.  Consideration for repair of AAA would be made when the size is 5.5 cm, growth > 1 cm/yr, and symptomatic status.  I emphasized the importance of maximal medical management including strict control of blood pressure, blood glucose, and lipid levels, antiplatelet agents, obtaining regular exercise, and continued cessation of smoking.   The patient was given information about AAA including signs, symptoms, treatment, and how to minimize the risk of enlargement and rupture  of aneurysms.    The patient was advised to call 911 should the patient experience sudden onset abdominal or back pain.   Thank you for allowing [place] to participate in this patient's care.  Clemon Chambers, RN, MSN, FNP-C [facility] Office: [phone]  Clinic Physician: Bridgett Larsson  05/22/2015, 9:18 AM

## 2015-05-22 NOTE — Patient Instructions (Addendum)
Abdominal Aortic Aneurysm An aneurysm is a weakened or damaged part of an artery wall that bulges from the normal force of blood pumping through the body. An abdominal aortic aneurysm is an aneurysm that occurs in the lower part of the aorta, the main artery of the body.  The major concern with an abdominal aortic aneurysm is that it can enlarge and burst (rupture) or blood can flow between the layers of the wall of the aorta through a tear (aorticdissection). Both of these conditions can cause bleeding inside the body and can be life threatening unless diagnosed and treated promptly. CAUSES  The exact cause of an abdominal aortic aneurysm is unknown. Some contributing factors are:   A hardening of the arteries caused by the buildup of fat and other substances in the lining of a blood vessel (arteriosclerosis).  Inflammation of the walls of an artery (arteritis).   Connective tissue diseases, such as Marfan syndrome.   Abdominal trauma.   An infection, such as syphilis or staphylococcus, in the wall of the aorta (infectious aortitis) caused by bacteria. RISK FACTORS  Risk factors that contribute to an abdominal aortic aneurysm may include:  Age older than 60 years.   High blood pressure (hypertension).  Male gender.  Ethnicity (white race).  Obesity.  Family history of aneurysm (first degree relatives only).  Tobacco use. PREVENTION  The following healthy lifestyle habits may help decrease your risk of abdominal aortic aneurysm:  Quitting smoking. Smoking can raise your blood pressure and cause arteriosclerosis.  Limiting or avoiding alcohol.  Keeping your blood pressure, blood sugar level, and cholesterol levels within normal limits.  Decreasing your salt intake. In somepeople, too much salt can raise blood pressure and increase your risk of abdominal aortic aneurysm.  Eating a diet low in saturated fats and cholesterol.  Increasing your fiber intake by including  whole grains, vegetables, and fruits in your diet. Eating these foods may help lower blood pressure.  Maintaining a healthy weight.  Staying physically active and exercising regularly. SYMPTOMS  The symptoms of abdominal aortic aneurysm may vary depending on the size and rate of growth of the aneurysm.Most grow slowly and do not have any symptoms. When symptoms do occur, they may include:  Pain (abdomen, side, lower back, or groin). The pain may vary in intensity. A sudden onset of severe pain may indicate that the aneurysm has ruptured.  Feeling full after eating only small amounts of food.  Nausea or vomiting or both.  Feeling a pulsating lump in the abdomen.  Feeling faint or passing out. DIAGNOSIS  Since most unruptured abdominal aortic aneurysms have no symptoms, they are often discovered during diagnostic exams for other conditions. An aneurysm may be found during the following procedures:  Ultrasonography (A one-time screening for abdominal aortic aneurysm by ultrasonography is also recommended for all men aged 65-75 years who have ever smoked).  X-ray exams.  A computed tomography (CT).  Magnetic resonance imaging (MRI).  Angiography or arteriography. TREATMENT  Treatment of an abdominal aortic aneurysm depends on the size of your aneurysm, your age, and risk factors for rupture. Medication to control blood pressure and pain may be used to manage aneurysms smaller than 6 cm. Regular monitoring for enlargement may be recommended by your caregiver if:  The aneurysm is 3-4 cm in size (an annual ultrasonography may be recommended).  The aneurysm is 4-4.5 cm in size (an ultrasonography every 6 months may be recommended).  The aneurysm is larger than 4.5 cm in   size (your caregiver may ask that you be examined by a vascular surgeon). If your aneurysm is larger than 6 cm, surgical repair may be recommended. There are two main methods for repair of an aneurysm:   Endovascular  repair (a minimally invasive surgery). This is done most often.  Open repair. This method is used if an endovascular repair is not possible. Document Released: 05/25/2005 Document Revised: 12/10/2012 Document Reviewed: 09/14/2012 ExitCare Patient Information 2015 [employer]. This information is not intended to replace advice given to you by your health care Evanne Matsunaga. Make sure you discuss any questions you have with your health care Breea Loncar.   Peripheral Vascular Disease Peripheral Vascular Disease (PVD), also called Peripheral Arterial Disease (PAD), is a circulation problem caused by cholesterol (atherosclerotic plaque) deposits in the arteries. PVD commonly occurs in the lower extremities (legs) but it can occur in other areas of the body, such as your arms. The cholesterol buildup in the arteries reduces blood flow which can cause pain and other serious problems. The presence of PVD can place a person at risk for Coronary Artery Disease (CAD).  CAUSES  Causes of PVD can be many. It is usually associated with more than one risk factor such as:   High Cholesterol.  Smoking.  Diabetes.  Lack of exercise or inactivity.  High blood pressure (hypertension).  Obesity.  Family history. SYMPTOMS   When the lower extremities are affected, patients with PVD may experience:  Leg pain with exertion or physical activity. This is called INTERMITTENT CLAUDICATION. This may present as cramping or numbness with physical activity. The location of the pain is associated with the level of blockage. For example, blockage at the abdominal level (distal abdominal aorta) may result in buttock or hip pain. Lower leg arterial blockage may result in calf pain.  As PVD becomes more severe, pain can develop with less physical activity.  In people with severe PVD, leg pain may occur at rest.  Other PVD signs and symptoms:  Leg numbness or weakness.  Coldness in the affected leg or foot, especially  when compared to the other leg.  A change in leg color.  Patients with significant PVD are more prone to ulcers or sores on toes, feet or legs. These may take longer to heal or may reoccur. The ulcers or sores can become infected.  If signs and symptoms of PVD are ignored, gangrene may occur. This can result in the loss of toes or loss of an entire limb.  Not all leg pain is related to PVD. Other medical conditions can cause leg pain such as:  Blood clots (embolism) or Deep Vein Thrombosis.  Inflammation of the blood vessels (vasculitis).  Spinal stenosis. DIAGNOSIS  Diagnosis of PVD can involve several different types of tests. These can include:  Pulse Volume Recording Method (PVR). This test is simple, painless and does not involve the use of X-rays. PVR involves measuring and comparing the blood pressure in the arms and legs. An ABI (Ankle-Brachial Index) is calculated. The normal ratio of blood pressures is 1. As this number becomes smaller, it indicates more severe disease.  < 0.95 - indicates significant narrowing in one or more leg vessels.  <0.8 - there will usually be pain in the foot, leg or buttock with exercise.  <0.4 - will usually have pain in the legs at rest.  <0.25 - usually indicates limb threatening PVD.  Doppler detection of pulses in the legs. This test is painless and checks to see if   you have a pulses in your legs/feet.  A dye or contrast material (a substance that highlights the blood vessels so they show up on x-ray) may be given to help your caregiver better see the arteries for the following tests. The dye is eliminated from your body by the kidney's. Your caregiver may order blood work to check your kidney function and other laboratory values before the following tests are performed:  Magnetic Resonance Angiography (MRA). An MRA is a picture study of the blood vessels and arteries. The MRA machine uses a large magnet to produce images of the blood  vessels.  Computed Tomography Angiography (CTA). A CTA is a specialized x-ray that looks at how the blood flows in your blood vessels. An IV may be inserted into your arm so contrast dye can be injected.  Angiogram. Is a procedure that uses x-rays to look at your blood vessels. This procedure is minimally invasive, meaning a small incision (cut) is made in your groin. A small tube (catheter) is then inserted into the artery of your groin. The catheter is guided to the blood vessel or artery your caregiver wants to examine. Contrast dye is injected into the catheter. X-rays are then taken of the blood vessel or artery. After the images are obtained, the catheter is taken out. TREATMENT  Treatment of PVD involves many interventions which may include:  Lifestyle changes:  Quitting smoking.  Exercise.  Following a low fat, low cholesterol diet.  Control of diabetes.  Foot care is very important to the PVD patient. Good foot care can help prevent infection.  Medication:  Cholesterol-lowering medicine.  Blood pressure medicine.  Anti-platelet drugs.  Certain medicines may reduce symptoms of Intermittent Claudication.  Interventional/Surgical options:  Angioplasty. An Angioplasty is a procedure that inflates a balloon in the blocked artery. This opens the blocked artery to improve blood flow.  Stent Implant. A wire mesh tube (stent) is placed in the artery. The stent expands and stays in place, allowing the artery to remain open.  Peripheral Bypass Surgery. This is a surgical procedure that reroutes the blood around a blocked artery to help improve blood flow. This type of procedure may be performed if Angioplasty or stent implants are not an option. SEEK IMMEDIATE MEDICAL CARE IF:   You develop pain or numbness in your arms or legs.  Your arm or leg turns cold, becomes blue in color.  You develop redness, warmth, swelling and pain in your arms or legs. MAKE SURE YOU:    Understand these instructions.  Will watch your condition.  Will get help right away if you are not doing well or get worse. Document Released: 09/22/2004 Document Revised: 11/07/2011 Document Reviewed: 08/19/2008 ExitCare Patient Information 2015 [employer]. This information is not intended to replace advice given to you by your health care Noura Purpura. Make sure you discuss any questions you have with your health care Vernell Back.  

## 2015-05-25 NOTE — Addendum Note (Signed)
Addended by: Dorthula Rue L on: 05/25/2015 09:39 AM   Modules accepted: Orders

## 2015-07-01 ENCOUNTER — Emergency Department (HOSPITAL_COMMUNITY)
Admission: EM | Admit: 2015-07-01 | Discharge: 2015-07-01 | Disposition: A | Discharge status: Home / Self Care | Payer: Medicare Other | Attending: Emergency Medicine | Admitting: Emergency Medicine

## 2015-07-01 ENCOUNTER — Encounter (HOSPITAL_COMMUNITY): Payer: Self-pay | Admitting: Emergency Medicine

## 2015-07-01 DIAGNOSIS — E7800 Pure hypercholesterolemia, unspecified: Secondary | ICD-10-CM | POA: Diagnosis not present

## 2015-07-01 DIAGNOSIS — Y9389 Activity, other specified: Secondary | ICD-10-CM | POA: Insufficient documentation

## 2015-07-01 DIAGNOSIS — Z23 Encounter for immunization: Secondary | ICD-10-CM | POA: Insufficient documentation

## 2015-07-01 DIAGNOSIS — S00412A Abrasion of left ear, initial encounter: Secondary | ICD-10-CM | POA: Diagnosis not present

## 2015-07-01 DIAGNOSIS — Z79899 Other long term (current) drug therapy: Secondary | ICD-10-CM | POA: Diagnosis not present

## 2015-07-01 DIAGNOSIS — W1839XA Other fall on same level, initial encounter: Secondary | ICD-10-CM | POA: Diagnosis not present

## 2015-07-01 DIAGNOSIS — Z792 Long term (current) use of antibiotics: Secondary | ICD-10-CM | POA: Diagnosis not present

## 2015-07-01 DIAGNOSIS — I10 Essential (primary) hypertension: Secondary | ICD-10-CM | POA: Diagnosis not present

## 2015-07-01 DIAGNOSIS — S81812A Laceration without foreign body, left lower leg, initial encounter: Secondary | ICD-10-CM | POA: Insufficient documentation

## 2015-07-01 DIAGNOSIS — S8992XA Unspecified injury of left lower leg, initial encounter: Secondary | ICD-10-CM | POA: Diagnosis present

## 2015-07-01 DIAGNOSIS — Y998 Other external cause status: Secondary | ICD-10-CM | POA: Insufficient documentation

## 2015-07-01 DIAGNOSIS — Z8719 Personal history of other diseases of the digestive system: Secondary | ICD-10-CM | POA: Insufficient documentation

## 2015-07-01 DIAGNOSIS — Z86018 Personal history of other benign neoplasm: Secondary | ICD-10-CM | POA: Insufficient documentation

## 2015-07-01 DIAGNOSIS — Z87891 Personal history of nicotine dependence: Secondary | ICD-10-CM | POA: Insufficient documentation

## 2015-07-01 DIAGNOSIS — Z7901 Long term (current) use of anticoagulants: Secondary | ICD-10-CM | POA: Diagnosis not present

## 2015-07-01 DIAGNOSIS — I4891 Unspecified atrial fibrillation: Secondary | ICD-10-CM | POA: Insufficient documentation

## 2015-07-01 DIAGNOSIS — M1990 Unspecified osteoarthritis, unspecified site: Secondary | ICD-10-CM | POA: Diagnosis not present

## 2015-07-01 DIAGNOSIS — Y9289 Other specified places as the place of occurrence of the external cause: Secondary | ICD-10-CM | POA: Insufficient documentation

## 2015-07-01 DIAGNOSIS — W19XXXA Unspecified fall, initial encounter: Secondary | ICD-10-CM

## 2015-07-01 LAB — PROTIME-INR
INR: 2.3 — ABNORMAL HIGH (ref 0.00–1.49)
Prothrombin Time: 25 seconds — ABNORMAL HIGH (ref 11.6–15.2)

## 2015-07-01 MED ORDER — TETANUS-DIPHTH-ACELL PERTUSSIS 5-2.5-18.5 LF-MCG/0.5 IM SUSP
0.5000 mL | Freq: Once | INTRAMUSCULAR | Status: AC
  Administered 2015-07-01: 0.5 mL via INTRAMUSCULAR
  Filled 2015-07-01: qty 0.5

## 2015-07-01 NOTE — Discharge Instructions (Signed)
Laceration Care, Adult  A laceration is a cut that goes through all layers of the skin. The cut also goes into the tissue that is right under the skin. Some cuts heal on their own. Others need to be closed with stitches (sutures), staples, skin adhesive strips, or wound glue. Taking care of your cut lowers your risk of infection and helps your cut to heal better.  HOW TO TAKE CARE OF YOUR CUT  For stitches or staples:  · Keep the wound clean and dry.  · If you were given a bandage (dressing), you should change it at least one time per day or as told by your doctor. You should also change it if it gets wet or dirty.  · Keep the wound completely dry for the first 24 hours or as told by your doctor. After that time, you may take a shower or a bath. However, make sure that the wound is not soaked in water until after the stitches or staples have been removed.  · Clean the wound one time each day or as told by your doctor:    Wash the wound with soap and water.    Rinse the wound with water until all of the soap comes off.    Pat the wound dry with a clean towel. Do not rub the wound.  · After you clean the wound, put a thin layer of antibiotic ointment on it as told by your doctor. This ointment:    Helps to prevent infection.    Keeps the bandage from sticking to the wound.  · Have your stitches or staples removed as told by your doctor.  If your doctor used skin adhesive strips:   · Keep the wound clean and dry.  · If you were given a bandage, you should change it at least one time per day or as told by your doctor. You should also change it if it gets dirty or wet.  · Do not get the skin adhesive strips wet. You can take a shower or a bath, but be careful to keep the wound dry.  · If the wound gets wet, pat it dry with a clean towel. Do not rub the wound.  · Skin adhesive strips fall off on their own. You can trim the strips as the wound heals. Do not remove any strips that are still stuck to the wound. They will  fall off after a while.  If your doctor used wound glue:  · Try to keep your wound dry, but you may briefly wet it in the shower or bath. Do not soak the wound in water, such as by swimming.  · After you take a shower or a bath, gently pat the wound dry with a clean towel. Do not rub the wound.  · Do not do any activities that will make you really sweaty until the skin glue has fallen off on its own.  · Do not apply liquid, cream, or ointment medicine to your wound while the skin glue is still on.  · If you were given a bandage, you should change it at least one time per day or as told by your doctor. You should also change it if it gets dirty or wet.  · If a bandage is placed over the wound, do not let the tape for the bandage touch the skin glue.  · Do not pick at the glue. The skin glue usually stays on for 5-10 days. Then, it   falls off of the skin.  General Instructions   · To help prevent scarring, make sure to cover your wound with sunscreen whenever you are outside after stitches are removed, after adhesive strips are removed, or when wound glue stays in place and the wound is healed. Make sure to wear a sunscreen of at least 30 SPF.  · Take over-the-counter and prescription medicines only as told by your doctor.  · If you were given antibiotic medicine or ointment, take or apply it as told by your doctor. Do not stop using the antibiotic even if your wound is getting better.  · Do not scratch or pick at the wound.  · Keep all follow-up visits as told by your doctor. This is important.  · Check your wound every day for signs of infection. Watch for:    Redness, swelling, or pain.    Fluid, blood, or pus.  · Raise (elevate) the injured area above the level of your heart while you are sitting or lying down, if possible.  GET HELP IF:  · You got a tetanus shot and you have any of these problems at the injection site:    Swelling.    Very bad pain.    Redness.    Bleeding.  · You have a fever.  · A wound that was  closed breaks open.  · You notice a bad smell coming from your wound or your bandage.  · You notice something coming out of the wound, such as wood or glass.  · Medicine does not help your pain.  · You have more redness, swelling, or pain at the site of your wound.  · You have fluid, blood, or pus coming from your wound.  · You notice a change in the color of your skin near your wound.  · You need to change the bandage often because fluid, blood, or pus is coming from the wound.  · You start to have a new rash.  · You start to have numbness around the wound.  GET HELP RIGHT AWAY IF:  · You have very bad swelling around the wound.  · Your pain suddenly gets worse and is very bad.  · You notice painful lumps near the wound or on skin that is anywhere on your body.  · You have a red streak going away from your wound.  · The wound is on your hand or foot and you cannot move a finger or toe like you usually can.  · The wound is on your hand or foot and you notice that your fingers or toes look pale or bluish.     This information is not intended to replace advice given to you by your health care provider. Make sure you discuss any questions you have with your health care provider.     Document Released: 02/01/2008 Document Revised: 12/30/2014 Document Reviewed: 08/11/2014  Elsevier Interactive Patient Education ©2016 [employer]

## 2015-07-01 NOTE — ED Notes (Signed)
Left lower leg laceration dressed with Xeroform, Telfa dressing and a dry gauze wrapping. No other c/c. MD Kohut in to speak with patient. No other c/c. Knows to follow up with [place] or PCP if any increased redness/swelling/bleeding. Ambulatory with steady gait.

## 2015-07-01 NOTE — ED Notes (Signed)
Pt reports falling today but doesn't remember if he became dizzy. Has lower left leg laceration with dressing applied to control bleeding. Attempted using wound seal with no luck. Pt is on blood thinners. No other c/c. Pt did hit his head. Wife reports there is a small abrasion on the left ear and says there was "a lot of blood on the left side of the face." Ambulatory with steady gait.

## 2015-07-01 NOTE — ED Notes (Signed)
Pt walked to and from restroom.

## 2015-07-07 ENCOUNTER — Ambulatory Visit (INDEPENDENT_AMBULATORY_CARE_PROVIDER_SITE_OTHER): Payer: Medicare Other | Admitting: Podiatry

## 2015-07-07 ENCOUNTER — Encounter: Payer: Self-pay | Admitting: Podiatry

## 2015-07-07 DIAGNOSIS — B351 Tinea unguium: Secondary | ICD-10-CM | POA: Diagnosis not present

## 2015-07-07 DIAGNOSIS — M79676 Pain in unspecified toe(s): Secondary | ICD-10-CM

## 2015-07-07 NOTE — Progress Notes (Signed)
Patient ID: Patrick [name], male   DOB: 09/14/1920, 81 y.o.   MRN: [id number] Complaint:  Visit Type: Patient returns to my office for continued preventative foot care services. Complaint: Patient states" my nails have grown long and thick and become painful to walk and wear shoes" . The patient presents for preventative foot care services. No changes to ROS  Podiatric Exam: Vascular: dorsalis pedis and posterior tibial pulses are palpable bilateral. Capillary return is immediate. Temperature gradient is WNL. Skin turgor WNL .  Purplish discoloration both feet. Sensorium: Normal Semmes Weinstein monofilament test. Normal tactile sensation bilaterally. Nail Exam: Pt has thick disfigured discolored nails with subungual debris noted bilateral entire nail hallux through fifth toenails Ulcer Exam: There is no evidence of ulcer or pre-ulcerative changes or infection. Orthopedic Exam: Muscle tone and strength are WNL. No limitations in general ROM. No crepitus or effusions noted. Foot type and digits show no abnormalities. Bony prominences are unremarkable. Skin: No Porokeratosis. No infection or ulcers  Diagnosis:  Onychomycosis, , Pain in right toe, pain in left toes  Treatment & Plan Procedures and Treatment: Consent by patient was obtained for treatment procedures. The patient understood the discussion of treatment and procedures well. All questions were answered thoroughly reviewed. Debridement of mycotic and hypertrophic toenails, 1 through 5 bilateral and clearing of subungual debris. No ulceration, no infection noted.  Return Visit-Office Procedure: Patient instructed to return to the office for a follow up visit 3 months for continued evaluation and treatment. 

## 2015-07-13 NOTE — ED Provider Notes (Signed)
CSN: [id number]     Arrival date & time 07/01/15  1758 History   First MD Initiated Contact with Patient 07/01/15 1901     Chief Complaint  Patient presents with  . Extremity Laceration  . Fall     (Consider location/radiation/quality/duration/timing/severity/associated sxs/prior Treatment) HPI   81ym with fall. Happened just before arrival. doesnt remember exact circumstances but doesn't think had loc. On coumadin. Wound to lle which was having difficulty controlling bleeding. Applied "quick clot" at wrapped prior to arrival. Abrasion to l ear. Denies ha, neck ro back pain. Baseline mental status per son at bedside. No n/v. No acute neuro complaints.  Past Medical History  Diagnosis Date  . Atrial fibrillation ([place])   . Hypertension   . Hypercholesterolemia   . Arthritis   . AAA (abdominal aortic aneurysm) ([place])   . Adenomatous colon polyp 2004    TVA 04/2006  . Diverticulosis   . Hemorrhoids    Past Surgical History  Procedure Laterality Date  . Hernia repair  Age 36  . Tonsillectomy      Very Young   Family History  Problem Relation Age of Onset  . Emphysema Father    Social History  Substance Use Topics  . Smoking status: Former Smoker    Types: Cigarettes    Quit date: 08/29/1969  . Smokeless tobacco: Never Used  . Alcohol Use: Yes     Comment: glass of wine 1-2 times per week    Review of Systems  All systems reviewed and negative, other than as noted in HPI.   Allergies  Review of patient's allergies indicates no known allergies.  Home Medications   Prior to Admission medications   Medication Sig Start Date End Date Taking? Authorizing Provider  atorvastatin (LIPITOR) 80 MG tablet Take 80 mg by mouth daily.     Yes Historical Provider, MD  Calcium Carbonate-Vitamin D (CALTRATE 600+D PO) Take 600 mg by mouth daily.    Yes Historical Provider, MD  furosemide (LASIX) 20 MG tablet Take 40 mg by mouth daily. 01/13/15  Yes Historical Provider, MD  KLOR-CON  10 10 MEQ tablet Take 20 mEq by mouth daily. 06/15/15  Yes Historical Provider, MD  lisinopril (PRINIVIL,ZESTRIL) 5 MG tablet Take 5 mg by mouth daily.     Yes Historical Provider, MD  metoprolol (LOPRESSOR) 50 MG tablet TAKE 1 TAB BY MOUTH ONCE A DAY 03/03/15  Yes Historical Provider, MD  mupirocin ointment (BACTROBAN) 2 % Apply topically 2 (two) times daily. 03/27/15  Yes Historical Provider, MD  Omega-3 Fatty Acids (FISH OIL) 1200 MG CAPS Take 1 capsule by mouth daily.    Yes Historical Provider, MD  Polyethyl Glycol-Propyl Glycol (SYSTANE OP) Apply 1 drop to eye 2 (two) times daily as needed (dry eyes).    Yes Historical Provider, MD  Polyethylene Glycol 3350 (MIRALAX PO) Take by mouth 2 (two) times a week.     Yes Historical Provider, MD  warfarin (COUMADIN) 2 MG tablet Take 2 mg by mouth daily.   Yes Historical Provider, MD  warfarin (COUMADIN) 5 MG tablet Take 5 mg by mouth daily.   Yes Historical Provider, MD  hydroxypropyl methylcellulose (ISOPTO TEARS) 2.5 % ophthalmic solution Place 1 drop into both eyes as needed.      Historical Provider, MD  METRONIDAZOLE, TOPICAL, 0.75 % LOTN Take 0.75 mg by mouth 2 (two) times daily. Take one tablet BID 06/22/13   Historical Provider, MD   BP 125/78 mmHg  Pulse 65  Temp(Src)   Resp 15  SpO2 96% Physical Exam  Constitutional: He is oriented to person, place, and time. He appears well-developed and well-nourished. No distress.  HENT:  Head: Normocephalic.  Small abrasion to l ear. No bleeding. No hemotympanum. No facial/scalp tenderness or hematoma.   Eyes: Conjunctivae are normal. Right eye exhibits no discharge. Left eye exhibits no discharge.  Neck: Neck supple.  Cardiovascular: Normal rate, regular rhythm and normal heart sounds.  Exam reveals no gallop and no friction rub.   No murmur heard. Pulmonary/Chest: Effort normal and breath sounds normal. No respiratory distress.  Abdominal: Soft. He exhibits no distension. There is no tenderness.   Musculoskeletal: He exhibits no edema or tenderness.  No midline spinal tenderness.  Neurological: He is alert and oriented to person, place, and time. No cranial nerve deficit. Coordination normal.  steady gait  Skin: Skin is warm and dry.  Wound to L shin with "quick clot" over it. Hemostatic. Expected mild local soft tissue tenderness. Very low suspicion for fracture.nvi distally.  Psychiatric: He has a normal mood and affect. His behavior is normal. Thought content normal.  Nursing note and vitals reviewed.   ED Course  Procedures (including critical care time) Labs Review Labs Reviewed  PROTIME-INR - Abnormal; Notable for the following:    Prothrombin Time 25.0 (*)    INR 2.30 (*)    All other components within normal limits    Imaging Review No results found. I have personally reviewed and evaluated these images and lab results as part of my medical decision-making.   EKG Interpretation None      MDM   Final diagnoses:  Leg laceration, left, initial encounter  Anticoagulated on Coumadin  Fall, initial encounter    81yM presenting after fall. Laceration to LLE and "quick clot" applied prior to arrival. Hemostatic on my initial evaluation and again prior to DC. No further intervention in ED with regards to this. Expect to heal fine with local wound care. INR in appropriate range. Nonfocal neuro exam. Discussed neuroimaging particularly in setting of warfarin usage. Shared decision making with patient and deferred at this time. Aside from anticoagulation, nothing has me particularly worried for significant head/neuro trauma.    Virgel Manifold, MD 07/13/15 [phone]

## 2015-08-14 ENCOUNTER — Encounter: Payer: Self-pay | Admitting: Family

## 2015-08-21 ENCOUNTER — Encounter: Payer: Self-pay | Admitting: Family

## 2015-08-21 ENCOUNTER — Ambulatory Visit (HOSPITAL_COMMUNITY)
Admission: RE | Admit: 2015-08-21 | Discharge: 2015-08-21 | Disposition: A | Discharge status: Home / Self Care | Payer: Medicare Other | Source: Ambulatory Visit | Attending: Family | Admitting: Family

## 2015-08-21 ENCOUNTER — Ambulatory Visit (INDEPENDENT_AMBULATORY_CARE_PROVIDER_SITE_OTHER)
Admission: RE | Admit: 2015-08-21 | Discharge: 2015-08-21 | Disposition: A | Discharge status: Home / Self Care | Payer: Medicare Other | Source: Ambulatory Visit | Attending: Family | Admitting: Family

## 2015-08-21 ENCOUNTER — Ambulatory Visit (INDEPENDENT_AMBULATORY_CARE_PROVIDER_SITE_OTHER): Payer: Medicare Other | Admitting: Family

## 2015-08-21 VITALS — BP 133/72 | HR 52 | Temp 96.9°F | Resp 14 | Ht 69.0 in | Wt 163.0 lb

## 2015-08-21 DIAGNOSIS — R938 Abnormal findings on diagnostic imaging of other specified body structures: Secondary | ICD-10-CM | POA: Insufficient documentation

## 2015-08-21 DIAGNOSIS — I10 Essential (primary) hypertension: Secondary | ICD-10-CM | POA: Insufficient documentation

## 2015-08-21 DIAGNOSIS — I7409 Other arterial embolism and thrombosis of abdominal aorta: Secondary | ICD-10-CM | POA: Insufficient documentation

## 2015-08-21 DIAGNOSIS — I714 Abdominal aortic aneurysm, without rupture, unspecified: Secondary | ICD-10-CM

## 2015-08-21 DIAGNOSIS — I723 Aneurysm of iliac artery: Secondary | ICD-10-CM | POA: Diagnosis not present

## 2015-08-21 DIAGNOSIS — Z87891 Personal history of nicotine dependence: Secondary | ICD-10-CM

## 2015-08-21 DIAGNOSIS — E7800 Pure hypercholesterolemia, unspecified: Secondary | ICD-10-CM | POA: Insufficient documentation

## 2015-08-21 NOTE — Patient Instructions (Signed)
Abdominal Aortic Aneurysm An aneurysm is a weakened or damaged part of an artery wall that bulges from the normal force of blood pumping through the body. An abdominal aortic aneurysm is an aneurysm that occurs in the lower part of the aorta, the main artery of the body.  The major concern with an abdominal aortic aneurysm is that it can enlarge and burst (rupture) or blood can flow between the layers of the wall of the aorta through a tear (aorticdissection). Both of these conditions can cause bleeding inside the body and can be life threatening unless diagnosed and treated promptly. CAUSES  The exact cause of an abdominal aortic aneurysm is unknown. Some contributing factors are:   A hardening of the arteries caused by the buildup of fat and other substances in the lining of a blood vessel (arteriosclerosis).  Inflammation of the walls of an artery (arteritis).   Connective tissue diseases, such as Marfan syndrome.   Abdominal trauma.   An infection, such as syphilis or staphylococcus, in the wall of the aorta (infectious aortitis) caused by bacteria. RISK FACTORS  Risk factors that contribute to an abdominal aortic aneurysm may include:  Age older than 60 years.   High blood pressure (hypertension).  Male gender.  Ethnicity (white race).  Obesity.  Family history of aneurysm (first degree relatives only).  Tobacco use. PREVENTION  The following healthy lifestyle habits may help decrease your risk of abdominal aortic aneurysm:  Quitting smoking. Smoking can raise your blood pressure and cause arteriosclerosis.  Limiting or avoiding alcohol.  Keeping your blood pressure, blood sugar level, and cholesterol levels within normal limits.  Decreasing your salt intake. In somepeople, too much salt can raise blood pressure and increase your risk of abdominal aortic aneurysm.  Eating a diet low in saturated fats and cholesterol.  Increasing your fiber intake by including  whole grains, vegetables, and fruits in your diet. Eating these foods may help lower blood pressure.  Maintaining a healthy weight.  Staying physically active and exercising regularly. SYMPTOMS  The symptoms of abdominal aortic aneurysm may vary depending on the size and rate of growth of the aneurysm.Most grow slowly and do not have any symptoms. When symptoms do occur, they may include:  Pain (abdomen, side, lower back, or groin). The pain may vary in intensity. A sudden onset of severe pain may indicate that the aneurysm has ruptured.  Feeling full after eating only small amounts of food.  Nausea or vomiting or both.  Feeling a pulsating lump in the abdomen.  Feeling faint or passing out. DIAGNOSIS  Since most unruptured abdominal aortic aneurysms have no symptoms, they are often discovered during diagnostic exams for other conditions. An aneurysm may be found during the following procedures:  Ultrasonography (A one-time screening for abdominal aortic aneurysm by ultrasonography is also recommended for all men aged 65-75 years who have ever smoked).  X-ray exams.  A computed tomography (CT).  Magnetic resonance imaging (MRI).  Angiography or arteriography. TREATMENT  Treatment of an abdominal aortic aneurysm depends on the size of your aneurysm, your age, and risk factors for rupture. Medication to control blood pressure and pain may be used to manage aneurysms smaller than 6 cm. Regular monitoring for enlargement may be recommended by your caregiver if:  The aneurysm is 3-4 cm in size (an annual ultrasonography may be recommended).  The aneurysm is 4-4.5 cm in size (an ultrasonography every 6 months may be recommended).  The aneurysm is larger than 4.5 cm in   size (your caregiver may ask that you be examined by a vascular surgeon). If your aneurysm is larger than 6 cm, surgical repair may be recommended. There are two main methods for repair of an aneurysm:   Endovascular  repair (a minimally invasive surgery). This is done most often.  Open repair. This method is used if an endovascular repair is not possible.   This information is not intended to replace advice given to you by your health care provider. Make sure you discuss any questions you have with your health care provider.   Document Released: 05/25/2005 Document Revised: 12/10/2012 Document Reviewed: 09/14/2012 Elsevier Interactive Patient Education 2016 [employer]  

## 2015-08-21 NOTE — Progress Notes (Signed)
[facility] [place]  Established Abdominal Aortic Aneurysm  History of Present Illness  Patrick [name] is a 81 y.o. (05-Sep-1920) male patient of Dr. Bridgett Larsson who presents with chief complaint: follow up for AAA and right CIA aneurysm. Previous studies demonstrate an AAA, measuring 3.1 cm. The patient does not have back or abdominal pain. The patient is not a smoker. He has not had embolic sx to either foot.  He takes coumadin for atrial fib  He denies any known back problems. His legs "give out" after walking about 15-20 minutes. The patient denies claudication in legs with walking, denies non healing wounds. The patient denies history of stroke or TIA symptoms.  Pt Diabetic: borderline Pt smoker: former smoker, quit in the 1970's, smoked for about 20 years   Past Medical History  Diagnosis Date  . Atrial fibrillation ([place])   . Hypertension   . Hypercholesterolemia   . Arthritis   . AAA (abdominal aortic aneurysm) ([place])   . Adenomatous colon polyp 2004    TVA 04/2006  . Diverticulosis   . Hemorrhoids    Past Surgical History  Procedure Laterality Date  . Hernia repair  Age 27  . Tonsillectomy      Very Young   Social History Social History   Social History  . Marital Status: Divorced    Spouse Name: N/A  . Number of Children: 3  . Years of Education: N/A   Occupational History  . retired    Social History Main Topics  . Smoking status: Former Smoker    Types: Cigarettes    Quit date: 08/29/1969  . Smokeless tobacco: Never Used  . Alcohol Use: Yes     Comment: glass of wine 1-2 times per week  . Drug Use: No  . Sexual Activity: Not on file   Other Topics Concern  . Not on file   Social History Narrative   Family History Family History  Problem Relation Age of Onset  . Emphysema Father     Current Outpatient Prescriptions on File Prior to Visit  Medication Sig Dispense Refill  . atorvastatin (LIPITOR) 80 MG tablet Take 80 mg by  mouth daily.      . Calcium Carbonate-Vitamin D (CALTRATE 600+D PO) Take 600 mg by mouth daily.     . furosemide (LASIX) 20 MG tablet Take 40 mg by mouth daily.  11  . hydroxypropyl methylcellulose (ISOPTO TEARS) 2.5 % ophthalmic solution Place 1 drop into both eyes as needed.      Marland Kitchen KLOR-CON 10 10 MEQ tablet Take 20 mEq by mouth daily.  3  . lisinopril (PRINIVIL,ZESTRIL) 5 MG tablet Take 5 mg by mouth daily.      . metoprolol (LOPRESSOR) 50 MG tablet TAKE 1 TAB BY MOUTH ONCE A DAY  11  . METRONIDAZOLE, TOPICAL, 0.75 % LOTN Take 0.75 mg by mouth 2 (two) times daily. Take one tablet BID    . mupirocin ointment (BACTROBAN) 2 % Apply topically 2 (two) times daily.  0  . Omega-3 Fatty Acids (FISH OIL) 1200 MG CAPS Take 1 capsule by mouth daily.     Vladimir Faster Glycol-Propyl Glycol (SYSTANE OP) Apply 1 drop to eye 2 (two) times daily as needed (dry eyes).     . Polyethylene Glycol 3350 (MIRALAX PO) Take by mouth 2 (two) times a week.      . warfarin (COUMADIN) 2 MG tablet Take 2 mg by mouth daily.    Marland Kitchen warfarin (COUMADIN)  5 MG tablet Take 5 mg by mouth daily.     No current facility-administered medications on file prior to visit.   No Known Allergies  ROS: See HPI for pertinent positives and negatives.  Physical Examination  Filed Vitals:   08/21/15 0927  BP: 133/72  Pulse: 52  Temp: 96.9 F (36.1 C)  TempSrc: Oral  Resp: 14  Height: 5\' 9"  (1.753 m)  Weight: 163 lb (73.936 kg)  SpO2: 100%   Body mass index is 24.06 kg/(m^2).  General: A&O x 3, WDWN  Pulmonary: Sym exp, good air movt, CTAB, no rales, rhonchi, & wheezing  Cardiac: Regular rhythm, no detected murmurs.  Vascular: Vessel Right Left  Radial 2+Palpable 2+Palpable  Carotid Palpable, without bruit Palpable, without bruit  Aorta Not palpable N/A  Femoral Palpable Palpable  Popliteal Not palpable Not palpable  PT Not Palpable, audible Doppler signal 1+ Palpable  DP Not  palpable, audible Doppler signal Not palpable, no audible Dopplersignal   Gastrointestinal: soft, NTND, -G/R, - HSM, - palpable masses, - CVAT B  Musculoskeletal: M/S 5/5 throughout , Extremities without ischemic changes   Neurologic: Pain and light touch intact in extremities , Motor exam as listed above. CN 2-12 intact except is hard of hearing.                Non-Invasive Vascular Imaging  AAA Duplex (08/21/2015) ABDOMINAL AORTA DUPLEX EVALUATION    INDICATION: Evaluation of abdominal aorta    PREVIOUS INTERVENTION(S): None    DUPLEX EXAM:     LOCATION DIAMETER AP (cm) DIAMETER TRANSVERSE (cm) VELOCITIES (cm/sec)  Aorta Proximal 2.3 2.3 93  Aorta Mid 2.3 2.3 99  Aorta Distal 3.2 3.0 88  Right Common Iliac Artery 2.1 1.8 127  Left Common Iliac Artery 1.1 1.0 120    Previous max aortic diameter:  3.2 x 3.0 cm Date: 05/22/2015     ADDITIONAL FINDINGS: Right internal iliac artery demonstrates significantly elevated PSV of 489 cm/s. Previous report listed this vessel as the right CIA.    IMPRESSION: 1. Patent distal abdominal aortic aneurysm measuring 3.2 x 3.0 cm/s with plaque noted within 2. Right common iliac artery measures 2.1 x 1.8 cm/s with plaque.  Significant tortuosity is noted. Mild diffuse plaque noted throughout.       Compared to the previous exam:   Compared to previous exam elevated velocities appear to originate in the right internal iliac artery vs. the common iliac artery.     Medical Decision Making  The patient is a 81 y.o. male who presents with asymptomatic AAA with no increase in size. Right CIA aneurysm in September 2016 was 2.5 cm compared to 2.1 cm today. In September 2016 the right CIA velocity was 495 cm/s; today is 127 cm/s. There seems to be mild aortoiliac occlusive disease.  He has no back or abdominal pain, no claudication sx's. Pedal pulses are not palpable and diminished: ABI's today are normal bilaterally with tri and  biphasic waveforms.   He appears fit and younger than 29 years.   Based on this patient's exam and diagnostic studies, the patient will follow up in 6 months  with the following studies: bilateral aortoiliac duplex.  Consideration for repair of AAA would be made when the size is 5.5 cm, growth > 1 cm/yr, and symptomatic status.  I emphasized the importance of maximal medical management including strict control of blood pressure, blood glucose, and lipid levels, antiplatelet agents, obtaining regular exercise, and continued cessation of smoking.  The patient was given information about AAA including signs, symptoms, treatment, and how to minimize the risk of enlargement and rupture of aneurysms.    The patient was advised to call 911 should the patient experience sudden onset abdominal or back pain.   Thank you for allowing [place] to participate in this patient's care.  Clemon Chambers, RN, MSN, FNP-C [facility] [place] Office: [phone]  Clinic Physician: Early on call  08/21/2015, 9:41 AM

## 2015-10-06 ENCOUNTER — Ambulatory Visit (INDEPENDENT_AMBULATORY_CARE_PROVIDER_SITE_OTHER): Payer: Medicare Other | Admitting: Cardiovascular Disease

## 2015-10-06 ENCOUNTER — Encounter: Payer: Self-pay | Admitting: Cardiovascular Disease

## 2015-10-06 VITALS — BP 96/58 | HR 52 | Ht 69.0 in | Wt 169.8 lb

## 2015-10-06 DIAGNOSIS — I10 Essential (primary) hypertension: Secondary | ICD-10-CM

## 2015-10-06 DIAGNOSIS — I482 Chronic atrial fibrillation, unspecified: Secondary | ICD-10-CM

## 2015-10-06 LAB — BASIC METABOLIC PANEL
BUN: 55 mg/dL — ABNORMAL HIGH (ref 7–25)
CO2: 27 mmol/L (ref 20–31)
Calcium: 9.2 mg/dL (ref 8.6–10.3)
Chloride: 103 mmol/L (ref 98–110)
Creat: 1.48 mg/dL — ABNORMAL HIGH (ref 0.70–1.11)
GLUCOSE: 150 mg/dL — AB (ref 65–99)
POTASSIUM: 4.6 mmol/L (ref 3.5–5.3)
SODIUM: 140 mmol/L (ref 135–146)

## 2015-10-06 MED ORDER — POTASSIUM CHLORIDE ER 10 MEQ PO TBCR: 20.0000 meq | EXTENDED_RELEASE_TABLET | ORAL | Status: DC

## 2015-10-06 MED ORDER — FUROSEMIDE 20 MG PO TABS: 40.0000 mg | ORAL_TABLET | ORAL | Status: DC

## 2015-10-06 NOTE — Progress Notes (Signed)
Cardiology Office Note   Date:  10/06/2015   ID:  Patrick [name], DOB 21-Jul-1921, MRN [id number]  PCP:  Jerlyn Ly, MD  Cardiologist:   Thayer Headings, MD   Chief Complaint  Patient presents with  . Follow-up   1. Atrial Fibrillation 2. Hyperlipidemia 3. Hypertension   History of Present Illness:  82 yo gentleman with a hx of atrial fibrillation. It's his birthday today. He is still fairly active. 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 He worked at [employer] garden this past summer.   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  Patrick [name] presents today for his one-year visit. He's not had any problems. He's on chronic Coumadin for his atrial fibrillation. His INR levels are checked by the Scientist, product/process development.   Dec. 10, 2014:  Patrick [name] is doing well. It is his birthday today. He is able to below his normal activities without any significant problems. He has chronic atrial fibrillation. He's on Coumadin and his INR levels are checked at the medical Oceanographer.  Dec. 11, 2015:  Patrick [name] is a 82 yo with hx of A-fib and HTN.  His Birthday was yesterday. Still active. Mows the lawn. Works in his garden in the spring time    Jan 21, 2015 Patrick [name] is a 82 y.o. male who presents for evaluation of atrial fib and fatigue .  He has had more fatigue renctly  His normal activities seem to tire him fairly quickly. No CP , no dyspnea .  Still wears his compression hose.   April 06, 2015:  Patrick [name] is doing well. BP has been running normal. - a bit low   Feb. 7, 2017:  Feeling well. Had some bronchitis over Christmas.   Had some leg swelling .   Was started on Lasix dr. Joylene Draft .   Swelling has resolved.  BP has been ok.   No dizziness. Metoprolol was also decresed.  HR is still low but no dizziness or orthostatic symptoms   Past Medical History  Diagnosis Date  . Atrial fibrillation ([place])   . Hypertension   .  Hypercholesterolemia   . Arthritis   . AAA (abdominal aortic aneurysm) ([place])   . Adenomatous colon polyp 2004    TVA 04/2006  . Diverticulosis   . Hemorrhoids     Past Surgical History  Procedure Laterality Date  . Hernia repair  Age 54  . Tonsillectomy      Very Young     Current Outpatient Prescriptions  Medication Sig Dispense Refill  . atorvastatin (LIPITOR) 80 MG tablet Take 80 mg by mouth daily.      . furosemide (LASIX) 20 MG tablet Take 40 mg by mouth daily.  11  . hydroxypropyl methylcellulose (ISOPTO TEARS) 2.5 % ophthalmic solution Place 1 drop into both eyes as needed.      Marland Kitchen KLOR-CON 10 10 MEQ tablet Take 20 mEq by mouth daily.  3  . lisinopril (PRINIVIL,ZESTRIL) 5 MG tablet Take 5 mg by mouth daily.      . metoprolol (LOPRESSOR) 50 MG tablet TAKE 1/2  TAB BY MOUTH ONCE A DAY  11  . METRONIDAZOLE, TOPICAL, 0.75 % LOTN Take 0.75 mg by mouth 2 (two) times daily. Take one tablet BID    . mupirocin ointment (BACTROBAN) 2 % Apply topically 2 (two) times daily.  0  . Omega-3 Fatty Acids (FISH OIL) 1200 MG CAPS Take  1 capsule by mouth daily.     Patrick [name] (SYSTANE OP) Apply 1 drop to eye 2 (two) times daily as needed (dry eyes).     . Polyethylene [name] 3350 (MIRALAX PO) Take by mouth 2 (two) times a week.      . warfarin (COUMADIN) 2 MG tablet Take 2 mg by mouth daily.    Marland Kitchen warfarin (COUMADIN) 5 MG tablet Take 5 mg by mouth daily.     No current facility-administered medications for this visit.    Allergies:   Review of patient's allergies indicates no known allergies.    Social History:  The patient  reports that he quit smoking about 46 years ago. His smoking use included Cigarettes. He has never used smokeless tobacco. He reports that he drinks alcohol. He reports that he does not use illicit drugs.   Family History:  The patient's family history includes Emphysema in his father.    ROS:  Please see the history of present illness.     Review of Systems: Constitutional:  denies fever, chills, diaphoresis, appetite change and fatigue.  HEENT: denies photophobia, eye pain, redness, hearing loss, ear pain, congestion, sore throat, rhinorrhea, sneezing, neck pain, neck stiffness and tinnitus.  Respiratory: denies SOB, DOE, cough, chest tightness, and wheezing.  Cardiovascular: denies chest pain, palpitations and leg swelling.  Gastrointestinal: denies nausea, vomiting, abdominal pain, diarrhea, constipation, blood in stool.  Genitourinary: denies dysuria, urgency, frequency, hematuria, flank pain and difficulty urinating.  Musculoskeletal: denies  myalgias, back pain, joint swelling, arthralgias and gait problem.   Skin: denies pallor, rash and wound.  Neurological: denies dizziness, seizures, syncope, weakness, light-headedness, numbness and headaches.   Hematological: denies adenopathy, easy bruising, personal or family bleeding history.  Psychiatric/ Behavioral: denies suicidal ideation, mood changes, confusion, nervousness, sleep disturbance and agitation.      All other systems are reviewed and negative.   PHYSICAL EXAM: VS:  BP 96/58 mmHg  Pulse 52  Ht 5\' 9"  (1.753 m)  Wt 169 lb 12.8 oz (77.021 kg)  BMI 25.06 kg/m2  SpO2 97% , BMI Body mass index is 25.06 kg/(m^2). GEN: Well nourished, well developed, in no acute distress HEENT: normal Neck: no JVD, carotid bruits, or masses Cardiac: RRR; soft systolic murmur, rubs, or gallops,no edema  Respiratory:  clear to auscultation bilaterally, normal work of breathing GI: soft, nontender, nondistended, + BS MS: no deformity or atrophy,  Decreased skin turgur  Skin: warm and dry, no rash Neuro:  Strength and sensation are intact Psych: normal   EKG:  EKG is not ordered today.  Recent Labs: 02/03/2015: BUN 37*; Creatinine, Ser 1.08; Potassium 4.1; Sodium 141    Lipid Panel No results found for: CHOL, TRIG, HDL, CHOLHDL, VLDL, LDLCALC, LDLDIRECT    Wt  Readings from Last 3 Encounters:  10/06/15 169 lb 12.8 oz (77.021 kg)  08/21/15 163 lb (73.936 kg)  05/22/15 159 lb (72.122 kg)     Other studies Reviewed: Additional studies/ records that were reviewed today include: . Review of the above records demonstrates:   ASSESSMENT AND PLAN:  1. Atrial Fibrillation - his rate is well-controlled.   Abdulsamad is doing very well. Continue current medications. His heart rate and blood pressure are both fairly low but he's completely asymptomatic. I've asked him to let us know if he has any weakness or dizziness.  will see him again in 6 month for follow-up visit.  2. Hyperlipidemia - followed by Dr. Joylene Draft. 3. Hypertension- blood pressure  is well-controlled. 4. Leg swelling. He was started on Lasix and potassium. His leg swelling has resolved but now his blood pressure is a little low and I think that clinically he appears to be a little bit volume depleted. We'll decrease the Lasix and potassium to 3 times a week-Mondays, Wednesdays, Fridays.  Current medicines are reviewed at length with the patient today.  The patient does not have concerns regarding medicines.  The following changes have been made:  no change  Labs/ tests ordered today include:  No orders of the defined types were placed in this encounter.   Disposition:   FU with me in 6 months    Kajah Santizo, Wonda Cheng, MD  10/06/2015 9:47 AM    [facility] [place], [place], [place]  [address] Phone: [phone]; Fax: [phone]   [facility]  [address] [place], [place]  [address] [phone]    Fax [phone]

## 2015-10-06 NOTE — Patient Instructions (Addendum)
Medication Instructions:  DECREASE Potassium (klor con) and Lasix (furosemide) to Mondays, Wednesdays, and Fridays only    Labwork: TODAY - basic metabolic panel   Testing/Procedures: None Ordered   Follow-Up: Your physician wants you to follow-up in: 6 months with Dr. Acie Fredrickson.  You will receive a reminder letter in the mail two months in advance. If you don't receive a letter, please call our office to schedule the follow-up appointment.   If you need a refill on your cardiac medications before your next appointment, please call your pharmacy.   Thank you for choosing [facility]! Christen Bame, RN [phone]

## 2015-10-07 ENCOUNTER — Other Ambulatory Visit: Payer: Self-pay | Admitting: Nurse Practitioner

## 2015-10-07 DIAGNOSIS — I10 Essential (primary) hypertension: Secondary | ICD-10-CM

## 2015-10-13 ENCOUNTER — Encounter: Payer: Self-pay | Admitting: Podiatry

## 2015-10-13 ENCOUNTER — Ambulatory Visit (INDEPENDENT_AMBULATORY_CARE_PROVIDER_SITE_OTHER): Payer: Medicare Other | Admitting: Podiatry

## 2015-10-13 DIAGNOSIS — B351 Tinea unguium: Secondary | ICD-10-CM

## 2015-10-13 DIAGNOSIS — M79676 Pain in unspecified toe(s): Secondary | ICD-10-CM | POA: Diagnosis not present

## 2015-10-13 NOTE — Progress Notes (Signed)
Patient ID: Patrick [name], male   DOB: 09/02/1920, 82 y.o.   MRN: [id number] Complaint:  Visit Type: Patient returns to my office for continued preventative foot care services. Complaint: Patient states" my nails have grown long and thick and become painful to walk and wear shoes" . The patient presents for preventative foot care services. No changes to ROS  Podiatric Exam: Vascular: dorsalis pedis and posterior tibial pulses are palpable bilateral. Capillary return is immediate. Temperature gradient is WNL. Skin turgor WNL .  Purplish discoloration both feet. Sensorium: Normal Semmes Weinstein monofilament test. Normal tactile sensation bilaterally. Nail Exam: Pt has thick disfigured discolored nails with subungual debris noted bilateral entire nail hallux through fifth toenails Ulcer Exam: There is no evidence of ulcer or pre-ulcerative changes or infection. Orthopedic Exam: Muscle tone and strength are WNL. No limitations in general ROM. No crepitus or effusions noted. Foot type and digits show no abnormalities. Bony prominences are unremarkable. Skin: No Porokeratosis. No infection or ulcers  Diagnosis:  Onychomycosis, , Pain in right toe, pain in left toes  Treatment & Plan Procedures and Treatment: Consent by patient was obtained for treatment procedures. The patient understood the discussion of treatment and procedures well. All questions were answered thoroughly reviewed. Debridement of mycotic and hypertrophic toenails, 1 through 5 bilateral and clearing of subungual debris. No ulceration, no infection noted.  Return Visit-Office Procedure: Patient instructed to return to the office for a follow up visit 3 months for continued evaluation and treatment.   Gardiner Barefoot DPM

## 2015-11-03 ENCOUNTER — Other Ambulatory Visit (INDEPENDENT_AMBULATORY_CARE_PROVIDER_SITE_OTHER): Payer: Medicare Other | Admitting: *Deleted

## 2015-11-03 DIAGNOSIS — I10 Essential (primary) hypertension: Secondary | ICD-10-CM

## 2015-11-03 LAB — BASIC METABOLIC PANEL
BUN: 29 mg/dL — AB (ref 7–25)
CHLORIDE: 105 mmol/L (ref 98–110)
CO2: 24 mmol/L (ref 20–31)
Calcium: 8.4 mg/dL — ABNORMAL LOW (ref 8.6–10.3)
Creat: 1.27 mg/dL — ABNORMAL HIGH (ref 0.70–1.11)
Glucose, Bld: 119 mg/dL — ABNORMAL HIGH (ref 65–99)
POTASSIUM: 4.4 mmol/L (ref 3.5–5.3)
SODIUM: 141 mmol/L (ref 135–146)

## 2016-01-13 ENCOUNTER — Ambulatory Visit (INDEPENDENT_AMBULATORY_CARE_PROVIDER_SITE_OTHER): Payer: Medicare Other | Admitting: Podiatry

## 2016-01-13 ENCOUNTER — Encounter: Payer: Self-pay | Admitting: Podiatry

## 2016-01-13 DIAGNOSIS — B351 Tinea unguium: Secondary | ICD-10-CM

## 2016-01-13 DIAGNOSIS — M79676 Pain in unspecified toe(s): Secondary | ICD-10-CM

## 2016-01-13 NOTE — Progress Notes (Signed)
Patient ID: Patrick [name], male   DOB: 09/02/1920, 82 y.o.   MRN: [id number] Complaint:  Visit Type: Patient returns to my office for continued preventative foot care services. Complaint: Patient states" my nails have grown long and thick and become painful to walk and wear shoes" . The patient presents for preventative foot care services. No changes to ROS  Podiatric Exam: Vascular: dorsalis pedis and posterior tibial pulses are palpable bilateral. Capillary return is immediate. Temperature gradient is WNL. Skin turgor WNL .  Purplish discoloration both feet. Sensorium: Normal Semmes Weinstein monofilament test. Normal tactile sensation bilaterally. Nail Exam: Pt has thick disfigured discolored nails with subungual debris noted bilateral entire nail hallux through fifth toenails Ulcer Exam: There is no evidence of ulcer or pre-ulcerative changes or infection. Orthopedic Exam: Muscle tone and strength are WNL. No limitations in general ROM. No crepitus or effusions noted. Foot type and digits show no abnormalities. Bony prominences are unremarkable. Skin: No Porokeratosis. No infection or ulcers  Diagnosis:  Onychomycosis, , Pain in right toe, pain in left toes  Treatment & Plan Procedures and Treatment: Consent by patient was obtained for treatment procedures. The patient understood the discussion of treatment and procedures well. All questions were answered thoroughly reviewed. Debridement of mycotic and hypertrophic toenails, 1 through 5 bilateral and clearing of subungual debris. No ulceration, no infection noted.  Return Visit-Office Procedure: Patient instructed to return to the office for a follow up visit 3 months for continued evaluation and treatment.   Gardiner Barefoot DPM

## 2016-02-11 ENCOUNTER — Encounter: Payer: Self-pay | Admitting: Family

## 2016-02-19 ENCOUNTER — Ambulatory Visit (INDEPENDENT_AMBULATORY_CARE_PROVIDER_SITE_OTHER): Payer: Medicare Other | Admitting: Family

## 2016-02-19 ENCOUNTER — Encounter: Payer: Self-pay | Admitting: Family

## 2016-02-19 ENCOUNTER — Ambulatory Visit (HOSPITAL_COMMUNITY)
Admission: RE | Admit: 2016-02-19 | Discharge: 2016-02-19 | Disposition: A | Discharge status: Home / Self Care | Payer: Medicare Other | Source: Ambulatory Visit | Attending: Family | Admitting: Family

## 2016-02-19 VITALS — BP 132/76 | HR 80 | Temp 98.2°F | Resp 20 | Ht 69.0 in | Wt 165.0 lb

## 2016-02-19 DIAGNOSIS — I723 Aneurysm of iliac artery: Secondary | ICD-10-CM

## 2016-02-19 DIAGNOSIS — I714 Abdominal aortic aneurysm, without rupture, unspecified: Secondary | ICD-10-CM

## 2016-02-19 DIAGNOSIS — Z87891 Personal history of nicotine dependence: Secondary | ICD-10-CM | POA: Insufficient documentation

## 2016-02-19 DIAGNOSIS — I10 Essential (primary) hypertension: Secondary | ICD-10-CM | POA: Insufficient documentation

## 2016-02-19 DIAGNOSIS — Z9889 Other specified postprocedural states: Secondary | ICD-10-CM | POA: Insufficient documentation

## 2016-02-19 DIAGNOSIS — E7800 Pure hypercholesterolemia, unspecified: Secondary | ICD-10-CM | POA: Diagnosis not present

## 2016-02-19 NOTE — Patient Instructions (Signed)
Abdominal Aortic Aneurysm An aneurysm is a weakened or damaged part of an artery wall that bulges from the normal force of blood pumping through the body. An abdominal aortic aneurysm is an aneurysm that occurs in the lower part of the aorta, the main artery of the body.  The major concern with an abdominal aortic aneurysm is that it can enlarge and burst (rupture) or blood can flow between the layers of the wall of the aorta through a tear (aorticdissection). Both of these conditions can cause bleeding inside the body and can be life threatening unless diagnosed and treated promptly. CAUSES  The exact cause of an abdominal aortic aneurysm is unknown. Some contributing factors are:   A hardening of the arteries caused by the buildup of fat and other substances in the lining of a blood vessel (arteriosclerosis).  Inflammation of the walls of an artery (arteritis).   Connective tissue diseases, such as Marfan syndrome.   Abdominal trauma.   An infection, such as syphilis or staphylococcus, in the wall of the aorta (infectious aortitis) caused by bacteria. RISK FACTORS  Risk factors that contribute to an abdominal aortic aneurysm may include:  Age older than 60 years.   High blood pressure (hypertension).  Male gender.  Ethnicity (white race).  Obesity.  Family history of aneurysm (first degree relatives only).  Tobacco use. PREVENTION  The following healthy lifestyle habits may help decrease your risk of abdominal aortic aneurysm:  Quitting smoking. Smoking can raise your blood pressure and cause arteriosclerosis.  Limiting or avoiding alcohol.  Keeping your blood pressure, blood sugar level, and cholesterol levels within normal limits.  Decreasing your salt intake. In somepeople, too much salt can raise blood pressure and increase your risk of abdominal aortic aneurysm.  Eating a diet low in saturated fats and cholesterol.  Increasing your fiber intake by including  whole grains, vegetables, and fruits in your diet. Eating these foods may help lower blood pressure.  Maintaining a healthy weight.  Staying physically active and exercising regularly. SYMPTOMS  The symptoms of abdominal aortic aneurysm may vary depending on the size and rate of growth of the aneurysm.Most grow slowly and do not have any symptoms. When symptoms do occur, they may include:  Pain (abdomen, side, lower back, or groin). The pain may vary in intensity. A sudden onset of severe pain may indicate that the aneurysm has ruptured.  Feeling full after eating only small amounts of food.  Nausea or vomiting or both.  Feeling a pulsating lump in the abdomen.  Feeling faint or passing out. DIAGNOSIS  Since most unruptured abdominal aortic aneurysms have no symptoms, they are often discovered during diagnostic exams for other conditions. An aneurysm may be found during the following procedures:  Ultrasonography (A one-time screening for abdominal aortic aneurysm by ultrasonography is also recommended for all men aged 65-75 years who have ever smoked).  X-ray exams.  A computed tomography (CT).  Magnetic resonance imaging (MRI).  Angiography or arteriography. TREATMENT  Treatment of an abdominal aortic aneurysm depends on the size of your aneurysm, your age, and risk factors for rupture. Medication to control blood pressure and pain may be used to manage aneurysms smaller than 6 cm. Regular monitoring for enlargement may be recommended by your caregiver if:  The aneurysm is 3-4 cm in size (an annual ultrasonography may be recommended).  The aneurysm is 4-4.5 cm in size (an ultrasonography every 6 months may be recommended).  The aneurysm is larger than 4.5 cm in   size (your caregiver may ask that you be examined by a vascular surgeon). If your aneurysm is larger than 6 cm, surgical repair may be recommended. There are two main methods for repair of an aneurysm:   Endovascular  repair (a minimally invasive surgery). This is done most often.  Open repair. This method is used if an endovascular repair is not possible.   This information is not intended to replace advice given to you by your health care provider. Make sure you discuss any questions you have with your health care provider.   Document Released: 05/25/2005 Document Revised: 12/10/2012 Document Reviewed: 09/14/2012 Elsevier Interactive Patient Education 2016 [employer]  

## 2016-02-19 NOTE — Progress Notes (Signed)
[facility] [place]   CC: Follow up Abdominal Aortic Aneurysm  History of Present Illness  Patrick [name] is a 82 y.o. (08-Feb-1921) male patient of Dr. Bridgett Larsson who presents with chief complaint: follow up for AAA and right CIA aneurysm. Previous studies demonstrate an AAA, measuring 3.1 cm. The patient does not have back or abdominal pain. The patient is not a smoker. He has not had embolic sx to either foot.  He takes coumadin for atrial fib  He denies any known back problems. His legs "give out" after walking about 15-20 minutes. The patient denies claudication in legs with walking, denies non healing wounds. The patient denies history of stroke or TIA symptoms.  He stepped wrong and abraded his right shin, saw his PCP for this and will see her again today after leaving this visit.   Pt Diabetic: borderline Pt smoker: former smoker, quit in the 1970's, smoked for about 20 years    Past Medical History  Diagnosis Date  . Atrial fibrillation ([place])   . Hypertension   . Hypercholesterolemia   . Arthritis   . AAA (abdominal aortic aneurysm) ([place])   . Adenomatous colon polyp 2004    TVA 04/2006  . Diverticulosis   . Hemorrhoids    Past Surgical History  Procedure Laterality Date  . Hernia repair  Age 33  . Tonsillectomy      Very Young   Social History Social History   Social History  . Marital Status: Divorced    Spouse Name: N/A  . Number of Children: 3  . Years of Education: N/A   Occupational History  . retired    Social History Main Topics  . Smoking status: Former Smoker    Types: Cigarettes    Quit date: 08/29/1969  . Smokeless tobacco: Never Used  . Alcohol Use: Yes     Comment: glass of wine 1-2 times per week  . Drug Use: No  . Sexual Activity: Not on file   Other Topics Concern  . Not on file   Social History Narrative   Family History Family History  Problem Relation Age of Onset  . Emphysema Father     Current  Outpatient Prescriptions on File Prior to Visit  Medication Sig Dispense Refill  . atorvastatin (LIPITOR) 80 MG tablet Take 80 mg by mouth daily.      . furosemide (LASIX) 20 MG tablet Take 2 tablets (40 mg total) by mouth 3 (three) times a week. Take on Mon, Wed, Fri 90 tablet 3  . hydroxypropyl methylcellulose (ISOPTO TEARS) 2.5 % ophthalmic solution Place 1 drop into both eyes as needed.      Marland Kitchen lisinopril (PRINIVIL,ZESTRIL) 5 MG tablet Take 5 mg by mouth daily.      . metoprolol (LOPRESSOR) 50 MG tablet TAKE 1/2  TAB BY MOUTH ONCE A DAY  11  . METRONIDAZOLE, TOPICAL, 0.75 % LOTN Take 0.75 mg by mouth 2 (two) times daily. Take one tablet BID    . mupirocin ointment (BACTROBAN) 2 % Apply topically 2 (two) times daily.  0  . Omega-3 Fatty Acids (FISH OIL) 1200 MG CAPS Take 1 capsule by mouth daily.     Vladimir Faster Glycol-Propyl Glycol (SYSTANE OP) Apply 1 drop to eye 2 (two) times daily as needed (dry eyes).     . Polyethylene Glycol 3350 (MIRALAX PO) Take by mouth 2 (two) times a week.      . potassium chloride (K-DUR) 10 MEQ tablet  Take 2 tablets (20 mEq total) by mouth 3 (three) times a week. Take on Mon, Wed, Fri 90 tablet 3  . warfarin (COUMADIN) 2 MG tablet Take 2 mg by mouth daily.    Marland Kitchen warfarin (COUMADIN) 5 MG tablet Take 5 mg by mouth daily.     No current facility-administered medications on file prior to visit.   No Known Allergies  ROS: See HPI for pertinent positives and negatives.  Physical Examination  Filed Vitals:   02/19/16 0907  BP: 132/76  Pulse: 80  Temp: 98.2 F (36.8 C)  TempSrc: Oral  Resp: 20  Height: 5\' 9"  (1.753 m)  Weight: 165 lb (74.844 kg)  SpO2: 97%   Body mass index is 24.36 kg/(m^2).  General: A&O x 3, WDWN  Pulmonary: Sym exp, good air movt, CTAB, no rales, rhonchi, & wheezing  Cardiac: Irregular rhythm, no detected murmurs.  Vascular: Vessel Right Left  Radial 2+Palpable 2+Palpable  Carotid Palpable, without  bruit Palpable, without bruit  Aorta Not palpable N/A  Femoral Palpable Palpable  Popliteal Not palpable Not palpable  PT Not Palpable, + audible Doppler signal 1+ Palpable  DP Not palpable, + audible Doppler signal 2+ palpable   Gastrointestinal: soft, NTND, -G/R, - HSM, - palpable masses, - CVAT B  Musculoskeletal: M/S 5/5 throughout , Extremities without ischemic changes. 1-2+ pitting edema in left lower leg/foot, compression dressing on right lower leg for abrasion.   Neurologic: Pain and light touch intact in extremities , Motor exam as listed above. CN 2-12 intact except is hard of hearing.                     Non-Invasive Vascular Imaging  AAA Duplex (02/19/2016) ABDOMINAL AORTA DUPLEX EVALUATION    INDICATION: Evaluation of abdominal aorta    PREVIOUS INTERVENTION(S): None    DUPLEX EXAM:     LOCATION DIAMETER AP (cm) DIAMETER TRANSVERSE (cm) VELOCITIES (cm/sec)  Aorta Proximal 2.3 2.2 43  Aorta Mid 2.2 2.1 1.1  Aorta Distal 3.2 - 95  Right Common Iliac Artery 2.6 (2.1, 08/21/15) 2.6 164  Left Common Iliac Artery 1.0 .90 178    Previous max aortic diameter:  3.2 x 3.0 Date: 08/21/2015     ADDITIONAL FINDINGS:     IMPRESSION: 1. Patent distal abdominal aortic aneurysm measuring 3.2 cm AP. Unable to thoroughly visualized lateral walls. 2. Right common iliac artery measuring  2.6 x 2.6 cms, unable to obtain increased velocity in the right iliac artery as noted on prior exam of 08/21/2015    Compared to the previous exam:  Increase in size of right common iliac artery aneurysm     Medical Decision Making  The patient is a 82 y.o. male who presents with asymptomatic small AAA with no increase in size, 3.2 cm today. Right CIA aneurysm increased in diameter by 0.5 cm in six months, is 2.6 cm today.   Based on this patient's exam and diagnostic studies, the patient will follow up in 6 months  with the following  studies: bilateral aortoiliac duplex.  Consideration for repair of AAA would be made when the size is 5.5 cm, growth > 1 cm/yr, and symptomatic status.       Consideration for repair of iliac artery aneurysm would be made when the size is 3.0 cm.  I emphasized the importance of maximal medical management including strict control of blood pressure, blood glucose, and lipid levels, antiplatelet agents, obtaining regular exercise, and continued  cessation of  smoking.   The patient was given information about AAA including signs, symptoms, treatment, and how to minimize the risk of enlargement and rupture of aneurysms.    The patient was advised to call 911 should the patient experience sudden onset abdominal or back pain.   Thank you for allowing [place] to participate in this patient's care.  Clemon Chambers, RN, MSN, FNP-C [facility] [place] Office: [phone]  Clinic Physician: Bridgett Larsson  02/19/2016, 9:14 AM

## 2016-03-23 ENCOUNTER — Ambulatory Visit (INDEPENDENT_AMBULATORY_CARE_PROVIDER_SITE_OTHER): Payer: Medicare Other | Admitting: Podiatry

## 2016-03-23 ENCOUNTER — Encounter: Payer: Self-pay | Admitting: Podiatry

## 2016-03-23 DIAGNOSIS — B351 Tinea unguium: Secondary | ICD-10-CM

## 2016-03-23 DIAGNOSIS — M79676 Pain in unspecified toe(s): Secondary | ICD-10-CM | POA: Diagnosis not present

## 2016-03-23 NOTE — Progress Notes (Signed)
Patient ID: Patrick [name], male   DOB: [birth year], 82 y.o.   MRN: [id number] Complaint:  Visit Type: Patient returns to my office for continued preventative foot care services. Complaint: Patient states" my nails have grown long and thick and become painful to walk and wear shoes" . The patient presents for preventative foot care services. No changes to ROS  Podiatric Exam: Vascular: dorsalis pedis and posterior tibial pulses are palpable bilateral. Capillary return is immediate. Temperature gradient is WNL. Skin turgor WNL .  Purplish discoloration both feet. Sensorium: Normal Semmes Weinstein monofilament test. Normal tactile sensation bilaterally. Nail Exam: Pt has thick disfigured discolored nails with subungual debris noted bilateral entire nail hallux through fifth toenails Ulcer Exam: There is no evidence of ulcer or pre-ulcerative changes or infection. Orthopedic Exam: Muscle tone and strength are WNL. No limitations in general ROM. No crepitus or effusions noted. Foot type and digits show no abnormalities. Bony prominences are unremarkable. Skin: No Porokeratosis. No infection or ulcers  Diagnosis:  Onychomycosis, , Pain in right toe, pain in left toes  Treatment & Plan Procedures and Treatment: Consent by patient was obtained for treatment procedures. The patient understood the discussion of treatment and procedures well. All questions were answered thoroughly reviewed. Debridement of mycotic and hypertrophic toenails, 1 through 5 bilateral and clearing of subungual debris. No ulceration, no infection noted.  Return Visit-Office Procedure: Patient instructed to return to the office for a follow up visit prn for continued evaluation and treatment.   Gardiner Barefoot DPM

## 2016-05-06 ENCOUNTER — Encounter: Payer: Self-pay | Admitting: Cardiovascular Disease

## 2016-05-07 ENCOUNTER — Other Ambulatory Visit: Payer: Self-pay | Admitting: Internal Medicine

## 2016-05-07 DIAGNOSIS — R945 Abnormal results of liver function studies: Secondary | ICD-10-CM

## 2016-05-16 ENCOUNTER — Ambulatory Visit
Admission: RE | Admit: 2016-05-16 | Discharge: 2016-05-16 | Disposition: A | Discharge status: Home / Self Care | Payer: Medicare Other | Source: Ambulatory Visit | Attending: Internal Medicine | Admitting: Internal Medicine

## 2016-05-16 DIAGNOSIS — R945 Abnormal results of liver function studies: Secondary | ICD-10-CM

## 2016-05-20 ENCOUNTER — Ambulatory Visit (INDEPENDENT_AMBULATORY_CARE_PROVIDER_SITE_OTHER): Payer: Medicare Other | Admitting: Cardiovascular Disease

## 2016-05-20 ENCOUNTER — Encounter: Payer: Self-pay | Admitting: Cardiovascular Disease

## 2016-05-20 VITALS — BP 130/86 | HR 58 | Ht 69.0 in | Wt 167.0 lb

## 2016-05-20 DIAGNOSIS — I482 Chronic atrial fibrillation, unspecified: Secondary | ICD-10-CM

## 2016-05-20 DIAGNOSIS — R29898 Other symptoms and signs involving the musculoskeletal system: Secondary | ICD-10-CM

## 2016-05-20 DIAGNOSIS — M79606 Pain in leg, unspecified: Secondary | ICD-10-CM | POA: Diagnosis not present

## 2016-05-20 DIAGNOSIS — I10 Essential (primary) hypertension: Secondary | ICD-10-CM | POA: Diagnosis not present

## 2016-05-20 NOTE — Progress Notes (Signed)
Cardiology Office Note   Date:  05/20/2016   ID:  Patrick [name], DOB 1920/09/28, MRN [id number]  PCP:  Jerlyn Ly, MD  Cardiologist:   Mertie Moores, MD   Chief Complaint  Patient presents with  . Follow-up    atrial fib   1. Atrial Fibrillation 2. Hyperlipidemia 3. Hypertension    82 yo gentleman with a hx of atrial fibrillation. It's his birthday today. He is still fairly active. 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 He worked at [employer] garden this past summer.   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  Patrick [name] presents today for his one-year visit. He's not had any problems. He's on chronic Coumadin for his atrial fibrillation. His INR levels are checked by the Scientist, product/process development.   Dec. 10, 2014:  Patrick [name] is doing well. It is his birthday today. He is able to below his normal activities without any significant problems. He has chronic atrial fibrillation. He's on Coumadin and his INR levels are checked at the medical Oceanographer.  Dec. 11, 2015:  Patrick [name] is a 82 yo with hx of A-fib and HTN.  His Birthday was yesterday. Still active. Mows the lawn. Works in his garden in the spring time    Jan 21, 2015 Patrick [name] is a 81 y.o. male who presents for evaluation of atrial fib and fatigue .  He has had more fatigue renctly  His normal activities seem to tire him fairly quickly. No CP , no dyspnea .  Still wears his compression hose.   April 06, 2015:  Patrick [name] is doing well. BP has been running normal. - a bit low   Feb. 7, 2017:  Feeling well. Had some bronchitis over Christmas.   Had some leg swelling .   Was started on Lasix dr. Joylene Draft .   Swelling has resolved.  BP has been ok.   No dizziness. Metoprolol was also decresed.  HR is still low but no dizziness or orthostatic symptoms   Sept. 22, 2017:  Staying fairly active.  Had a good garden this past year.  Raised tomatoes - had pretty  good luck with those.   Fatigues easliy.   Legs give out easily. No claudication pain   Patrick [name] / MRM manages coumadin levels    Past Medical History:  Diagnosis Date  . AAA (abdominal aortic aneurysm) ([place])   . Adenomatous colon polyp 2004   TVA 04/2006  . Arthritis   . Atrial fibrillation ([place])   . Diverticulosis   . Hemorrhoids   . Hypercholesterolemia   . Hypertension     Past Surgical History:  Procedure Laterality Date  . HERNIA REPAIR  Age 43  . TONSILLECTOMY     Very Young     Current Outpatient Prescriptions  Medication Sig Dispense Refill  . atorvastatin (LIPITOR) 80 MG tablet Take 80 mg by mouth daily.      . furosemide (LASIX) 20 MG tablet Take 2 tablets (40 mg total) by mouth 3 (three) times a week. Take on Mon, Wed, Fri 90 tablet 3  . hydroxypropyl methylcellulose (ISOPTO TEARS) 2.5 % ophthalmic solution Place 1 drop into both eyes as needed.      Marland Kitchen lisinopril (PRINIVIL,ZESTRIL) 5 MG tablet Take 5 mg by mouth daily.      . metoprolol tartrate (LOPRESSOR) 25 MG tablet Take 25 mg by mouth daily.  0  . METRONIDAZOLE, TOPICAL, 0.75 %  LOTN Take 0.75 mg by mouth 2 (two) times daily. Take one tablet BID    . mupirocin ointment (BACTROBAN) 2 % Apply topically 2 (two) times daily.  0  . Omega-3 Fatty Acids (FISH OIL) 1200 MG CAPS Take 1 capsule by mouth daily.     Vladimir Faster Glycol-Propyl Glycol (SYSTANE OP) Apply 1 drop to eye 2 (two) times daily as needed (dry eyes).     . Polyethylene Glycol 3350 (MIRALAX PO) Take by mouth 2 (two) times a week.      . potassium chloride (K-DUR) 10 MEQ tablet Take 2 tablets (20 mEq total) by mouth 3 (three) times a week. Take on Mon, Wed, Fri 90 tablet 3  . warfarin (COUMADIN) 2 MG tablet Take 2 mg by mouth daily.    Marland Kitchen warfarin (COUMADIN) 5 MG tablet Take 5 mg by mouth daily.     No current facility-administered medications for this visit.     Allergies:   Review of patient's allergies indicates no known allergies.    Social  History:  The patient  reports that he quit smoking about 46 years ago. His smoking use included Cigarettes. He has never used smokeless tobacco. He reports that he drinks alcohol. He reports that he does not use drugs.   Family History:  The patient's family history includes Emphysema in his father.    ROS:  Please see the history of present illness.    Review of Systems: Constitutional:  denies fever, chills, diaphoresis, appetite change and fatigue.  HEENT: denies photophobia, eye pain, redness, hearing loss, ear pain, congestion, sore throat, rhinorrhea, sneezing, neck pain, neck stiffness and tinnitus.  Respiratory: denies SOB, DOE, cough, chest tightness, and wheezing.  Cardiovascular: denies chest pain, palpitations and leg swelling.  Gastrointestinal: denies nausea, vomiting, abdominal pain, diarrhea, constipation, blood in stool.  Genitourinary: denies dysuria, urgency, frequency, hematuria, flank pain and difficulty urinating.  Musculoskeletal: denies  myalgias, back pain, joint swelling, arthralgias and gait problem.   Skin: denies pallor, rash and wound.  Neurological: denies dizziness, seizures, syncope, weakness, light-headedness, numbness and headaches.   Hematological: denies adenopathy, easy bruising, personal or family bleeding history.  Psychiatric/ Behavioral: denies suicidal ideation, mood changes, confusion, nervousness, sleep disturbance and agitation.      All other systems are reviewed and negative.   PHYSICAL EXAM: VS:  BP 130/86 (BP Location: Right Arm, Patient Position: Sitting, Cuff Size: Normal)   Pulse (!) 58   Ht 5\' 9"  (1.753 m)   Wt 167 lb (75.8 kg)   BMI 24.66 kg/m  , BMI Body mass index is 24.66 kg/m. GEN: Well nourished, well developed, in no acute distress  HEENT: normal  Neck: no JVD, carotid bruits, or masses Cardiac: Irreg. Irreg. ; soft systolic murmur, rubs, or gallops,no edema , Reduced pulses in feet.  Respiratory:  clear to  auscultation bilaterally, normal work of breathing GI: soft, nontender, nondistended, + BS MS: no deformity or atrophy ,  Decreased skin turgur  Skin: warm and dry, no rash Neuro:  Strength and sensation are intact Psych: normal   EKG:  EKG is ordered today. Atrial fib with slow V response with occasional PVCs ,  Inc. RBBB  Recent Labs: 11/03/2015: BUN 29; Creat 1.27; Potassium 4.4; Sodium 141    Lipid Panel No results found for: CHOL, TRIG, HDL, CHOLHDL, VLDL, LDLCALC, LDLDIRECT    Wt Readings from Last 3 Encounters:  05/20/16 167 lb (75.8 kg)  02/19/16 165 lb (74.8 kg)  10/06/15 169  lb 12.8 oz (77 kg)     Other studies Reviewed: Additional studies/ records that were reviewed today include: . Review of the above records demonstrates:   ASSESSMENT AND PLAN:  1. Atrial Fibrillation - his rate is well-controlled.   Hashim is doing very well. Continue current medications. His heart rate and blood pressure are both fairly low but he's completely asymptomatic. I've asked him to let us know if he has any weakness or dizziness.  will see him again in 6 month for follow-up visit.  2. Hyperlipidemia - followed by Dr. Joylene Draft. 3. Hypertension- blood pressure is well-controlled. 4. Leg fatigue.    His pulses are reduced in his feet Will refer for ABI / lower extremity duplex scan .   Current medicines are reviewed at length with the patient today.  The patient does not have concerns regarding medicines.  The following changes have been made:  no change  Labs/ tests ordered today include:  No orders of the defined types were placed in this encounter.  Disposition:   FU with me in 6 months    Mertie Moores, MD  05/20/2016 9:13 AM    [facility] [address], [place], [place]  [address] Phone: [phone]; Fax: [phone]

## 2016-05-20 NOTE — Patient Instructions (Signed)
Medication Instructions:  Your physician recommends that you continue on your current medications as directed. Please refer to the Current Medication list given to you today.   Labwork: -None  Testing/Procedures: Your physician has requested that you have an ankle brachial index (ABI). (Leg Pain) During this test an ultrasound and blood pressure cuff are used to evaluate the arteries that supply the arms and legs with blood. Allow thirty minutes for this exam. There are no restrictions or special instructions.    Follow-Up:  Your physician wants you to follow-up in: 6 month with Dr. Cathie Olden.  You will receive a reminder letter in the mail two months in advance. If you don't receive a letter, please call our office to schedule the follow-up appointment.  Any Other Special Instructions Will Be Listed Below (If Applicable).     If you need a refill on your cardiac medications before your next appointment, please call your pharmacy.

## 2016-05-27 ENCOUNTER — Ambulatory Visit (HOSPITAL_COMMUNITY)
Admission: RE | Admit: 2016-05-27 | Discharge: 2016-05-27 | Disposition: A | Discharge status: Home / Self Care | Payer: Medicare Other | Source: Ambulatory Visit | Attending: Cardiology | Admitting: Cardiology

## 2016-05-27 DIAGNOSIS — R0989 Other specified symptoms and signs involving the circulatory and respiratory systems: Secondary | ICD-10-CM | POA: Diagnosis not present

## 2016-05-27 DIAGNOSIS — R29898 Other symptoms and signs involving the musculoskeletal system: Secondary | ICD-10-CM

## 2016-05-27 DIAGNOSIS — M79606 Pain in leg, unspecified: Secondary | ICD-10-CM | POA: Insufficient documentation

## 2016-07-08 ENCOUNTER — Other Ambulatory Visit: Payer: Self-pay | Admitting: *Deleted

## 2016-07-08 DIAGNOSIS — I723 Aneurysm of iliac artery: Secondary | ICD-10-CM

## 2016-08-10 ENCOUNTER — Encounter: Payer: Self-pay | Admitting: Podiatry

## 2016-08-10 ENCOUNTER — Ambulatory Visit (INDEPENDENT_AMBULATORY_CARE_PROVIDER_SITE_OTHER): Payer: Medicare Other | Admitting: Podiatry

## 2016-08-10 VITALS — Ht 69.0 in | Wt 167.0 lb

## 2016-08-10 DIAGNOSIS — M79676 Pain in unspecified toe(s): Secondary | ICD-10-CM | POA: Diagnosis not present

## 2016-08-10 DIAGNOSIS — B351 Tinea unguium: Secondary | ICD-10-CM

## 2016-08-10 NOTE — Progress Notes (Signed)
Patient ID: Patrick [name], male   DOB: 04-Aug-1921, 82 y.o.   MRN: [id number] Complaint:  Visit Type: Patient returns to my office for continued preventative foot care services. Complaint: Patient states" my nails have grown long and thick and become painful to walk and wear shoes" . The patient presents for preventative foot care services. No changes to ROS  Podiatric Exam: Vascular: dorsalis pedis and posterior tibial pulses are palpable bilateral. Capillary return is immediate. Temperature gradient is WNL. Skin turgor WNL .  Purplish discoloration both feet. Sensorium: Normal Semmes Weinstein monofilament test. Normal tactile sensation bilaterally. Nail Exam: Pt has thick disfigured discolored nails with subungual debris noted bilateral entire nail hallux through fifth toenails Ulcer Exam: There is no evidence of ulcer or pre-ulcerative changes or infection. Orthopedic Exam: Muscle tone and strength are WNL. No limitations in general ROM. No crepitus or effusions noted. Foot type and digits show no abnormalities. Bony prominences are unremarkable. Skin: No Porokeratosis. No infection or ulcers  Diagnosis:  Onychomycosis, , Pain in right toe, pain in left toes  Treatment & Plan Procedures and Treatment: Consent by patient was obtained for treatment procedures. The patient understood the discussion of treatment and procedures well. All questions were answered thoroughly reviewed. Debridement of mycotic and hypertrophic toenails, 1 through 5 bilateral and clearing of subungual debris. No ulceration, no infection noted.  Return Visit-Office Procedure: Patient instructed to return to the office for a follow up visit prn for continued evaluation and treatment.   Gardiner Barefoot DPM

## 2016-08-19 ENCOUNTER — Encounter: Payer: Self-pay | Admitting: Family

## 2016-09-02 ENCOUNTER — Encounter: Payer: Self-pay | Admitting: Family

## 2016-09-02 ENCOUNTER — Ambulatory Visit (HOSPITAL_COMMUNITY)
Admission: RE | Admit: 2016-09-02 | Discharge: 2016-09-02 | Disposition: A | Discharge status: Home / Self Care | Payer: Medicare Other | Source: Ambulatory Visit | Attending: Family | Admitting: Family

## 2016-09-02 ENCOUNTER — Ambulatory Visit (INDEPENDENT_AMBULATORY_CARE_PROVIDER_SITE_OTHER): Payer: Medicare Other | Admitting: Family

## 2016-09-02 VITALS — BP 104/61 | HR 51 | Temp 94.6°F | Resp 18 | Ht 69.0 in | Wt 167.0 lb

## 2016-09-02 DIAGNOSIS — I714 Abdominal aortic aneurysm, without rupture, unspecified: Secondary | ICD-10-CM

## 2016-09-02 DIAGNOSIS — I723 Aneurysm of iliac artery: Secondary | ICD-10-CM | POA: Diagnosis not present

## 2016-09-02 DIAGNOSIS — Z87891 Personal history of nicotine dependence: Secondary | ICD-10-CM

## 2016-09-02 NOTE — Progress Notes (Signed)
[facility]    [facility]: Follow up Abdominal Aortic Aneurysm  History of Present Illness  Patrick [name] is a 83 y.o. (1920-09-27) male patient of Dr. Bridgett Larsson who presents with chief complaint: follow up for AAA and right CIA aneurysm. Previous studies demonstrate an AAA, measuring 3.1 cm. The patient does not have back or abdominal pain. The patient is not a smoker. He has not had embolic sx to either foot.  He takes coumadin for atrial fib  He denies any known back problems. His legs "give out" after walking about 15-20 minutes. The patient denies pain in legs with walking, denies non healing wounds. The patient denies history of stroke or TIA symptoms.  Pt Diabetic: borderline Pt smoker: former smoker, quit in the 1970's, smoked for about 20 years    Past Medical History:  Diagnosis Date  . AAA (abdominal aortic aneurysm) ([place])   . Adenomatous colon polyp 2004   TVA 04/2006  . Arthritis   . Atrial fibrillation ([place])   . Diverticulosis   . Hemorrhoids   . Hypercholesterolemia   . Hypertension    Past Surgical History:  Procedure Laterality Date  . HERNIA REPAIR  Age 40  . TONSILLECTOMY     Very Young   Social History Social History   Social History  . Marital status: Divorced    Spouse name: N/A  . Number of children: 3  . Years of education: N/A   Occupational History  . retired Retired   Social History Main Topics  . Smoking status: Former Smoker    Types: Cigarettes    Quit date: 08/29/1969  . Smokeless tobacco: Never Used  . Alcohol use Yes     Comment: glass of wine 1-2 times per week  . Drug use: No  . Sexual activity: Not on file   Other Topics Concern  . Not on file   Social History Narrative  . No narrative on file   Family History Family History  Problem Relation Age of Onset  . Emphysema Father     Current Outpatient Prescriptions on File Prior to Visit  Medication Sig Dispense Refill  . atorvastatin (LIPITOR)  80 MG tablet Take 80 mg by mouth daily.      . furosemide (LASIX) 20 MG tablet Take 2 tablets (40 mg total) by mouth 3 (three) times a week. Take on Mon, Wed, Fri 90 tablet 3  . hydroxypropyl methylcellulose (ISOPTO TEARS) 2.5 % ophthalmic solution Place 1 drop into both eyes as needed.      Marland Kitchen lisinopril (PRINIVIL,ZESTRIL) 5 MG tablet Take 5 mg by mouth daily.      . metoprolol tartrate (LOPRESSOR) 25 MG tablet Take 25 mg by mouth daily.  0  . METRONIDAZOLE, TOPICAL, 0.75 % LOTN Take 0.75 mg by mouth 2 (two) times daily. Take one tablet BID    . Omega-3 Fatty Acids (FISH OIL) 1200 MG CAPS Take 1 capsule by mouth daily.     Vladimir Faster Glycol-Propyl Glycol (SYSTANE OP) Apply 1 drop to eye 2 (two) times daily as needed (dry eyes).     . Polyethylene Glycol 3350 (MIRALAX PO) Take by mouth 2 (two) times a week.      . potassium chloride (K-DUR) 10 MEQ tablet Take 2 tablets (20 mEq total) by mouth 3 (three) times a week. Take on Mon, Wed, Fri 90 tablet 3  . warfarin (COUMADIN) 2 MG tablet Take 2 mg by mouth daily.    Marland Kitchen  warfarin (COUMADIN) 5 MG tablet Take 5 mg by mouth daily.    . mupirocin ointment (BACTROBAN) 2 % Apply topically 2 (two) times daily.  0   No current facility-administered medications on file prior to visit.    No Known Allergies  ROS: See HPI for pertinent positives and negatives.  Physical Examination  Vitals:   09/02/16 0838  BP: 104/61  Pulse: (!) 51  Resp: 18  Temp: (!) 94.6 F (34.8 C)  Weight: 167 lb (75.8 kg)  Height: 5\' 9"  (1.753 m)   Body mass index is 24.66 kg/m.  General: A&O x 3, WDWN  Pulmonary: Sym exp, respirations are non labored, good air movt, CTAB, no rales, rhonchi, & wheezing  Cardiac: Irregular rhythm, bradycardic rate no detected murmurs.  Vascular: Vessel Right Left  Radial 2+Palpable 2+Palpable  Carotid Palpable, without bruit Palpable, without bruit  Aorta Not palpable N/A  Femoral  Palpable Palpable  Popliteal Not palpable Not palpable  PT Not Palpable,  Not  Palpable  DP Not palpable Not  palpable   Gastrointestinal: soft, NTND, -G/R, - HSM, - palpable masses, - CVAT B  Musculoskeletal: M/S 5/5 throughout , Extremities without ischemic changes. No lower extremity edema, + hemosiderin deposits both anterior tibial areas, pt was wearing knee high compression hose. Feet and hands are pink and warm.   Neurologic: Pain and light touch intact in extremities , Motor exam as listed above. CN 2-12 intact except is hard of hearing.    Non-Invasive Vascular Imaging  AAA Duplex (09/02/2016)  Previous size: 3.2 cm (Date: 02-19-16) Right CIA: 2.6 cm; Left CIA: 1.0 cm  Current size:  3.7 cm (Date: 09-02-16) Right CIA: 2.7 cm; Left CIA: 1.01 cm  Medical Decision Making  The patient is a 83 y.o. male who presents with asymptomatic AAA with 0.5 cm increase in size in 6 months (3.7 cm today), stable diameter of right CIA aneurysm at 2.7 cm today.  Non palpable pedal pulses: He had normal bilateral ABI's and TBI's on 05-27-16.   Based on this patient's exam and diagnostic studies, the patient will follow up in 6 months  with the following studies: bilateral aortoiliac duplex.  Consideration for repair of AAA would be made when the size is 5.0 cm, growth > 1 cm/yr, and symptomatic status. Consideration for repair of iliac artery aneurysm would be made when the size is 3.0 cm.  I emphasized the importance of maximal medical management including strict control of blood pressure, blood glucose, and lipid levels, antiplatelet agents, obtaining regular exercise, and continued cessation of smoking.   The patient was given information about AAA including signs, symptoms, treatment, and how to minimize the risk of enlargement and rupture of aneurysms.    The patient was advised to call 911 should the patient experience sudden onset abdominal or back pain.    Thank you for allowing [place] to participate in this patient's care.  Clemon Chambers, RN, MSN, FNP-C [facility] [place] Office: [phone]  Clinic Physician: Bridgett Larsson  09/02/2016, 8:42 AM

## 2016-09-02 NOTE — Patient Instructions (Signed)
Abdominal Aortic Aneurysm Blood pumps away from the heart through tubes (blood vessels) called arteries. Aneurysms are weak or damaged places in the wall of an artery. It bulges out like a balloon. An abdominal aortic aneurysm happens in the main artery of the body (aorta). It can burst or tear, causing bleeding inside the body. This is an emergency. It needs treatment right away. What are the causes? The exact cause is unknown. Things that could cause this problem include:  Fat and other substances building up in the lining of a tube.  Swelling of the walls of a blood vessel.  Certain tissue diseases.  Belly (abdominal) trauma.  An infection in the main artery of the body.  What increases the risk? There are things that make it more likely for you to have an aneurysm. These include:  Being over the age of 81 years old.  Having high blood pressure (hypertension).  Being a male.  Being white.  Being very overweight (obese).  Having a family history of aneurysm.  Using tobacco products.  What are the signs or symptoms? Symptoms depend on the size of the aneurysm and how fast it grows. There may not be symptoms. If symptoms occur, they can include:  Pain (belly, side, lower back, or groin).  Feeling full after eating a small amount of food.  Feeling sick to your stomach (nauseous), throwing up (vomiting), or both.  Feeling a lump in your belly that feels like it is beating (pulsating).  Feeling like you will pass out (faint).  How is this treated?  Medicine to control blood pressure and pain.  Imaging tests to see if the aneurysm gets bigger.  Surgery. How is this prevented? To lessen your chance of getting this condition:  Stop smoking. Stop chewing tobacco.  Limit or avoid alcohol.  Keep your blood pressure, blood sugar, and cholesterol within normal limits.  Eat less salt.  Eat foods low in saturated fats and cholesterol. These are found in animal and  whole dairy products.  Eat more fiber. Fiber is found in whole grains, vegetables, and fruits.  Keep a healthy weight.  Stay active and exercise often.  This information is not intended to replace advice given to you by your health care provider. Make sure you discuss any questions you have with your health care provider. Document Released: 12/10/2012 Document Revised: 01/21/2016 Document Reviewed: 09/14/2012 Elsevier Interactive Patient Education  2017 [employer]  

## 2016-09-05 ENCOUNTER — Other Ambulatory Visit: Payer: Self-pay

## 2016-09-05 DIAGNOSIS — I723 Aneurysm of iliac artery: Secondary | ICD-10-CM

## 2016-09-23 ENCOUNTER — Encounter (HOSPITAL_COMMUNITY): Payer: Self-pay

## 2016-09-23 ENCOUNTER — Inpatient Hospital Stay (HOSPITAL_COMMUNITY)
Admission: EM | Admit: 2016-09-23 | Discharge: 2016-09-26 | DRG: 378 | Disposition: A | Discharge status: Home / Self Care | Payer: Medicare Other | Attending: Internal Medicine | Admitting: Internal Medicine

## 2016-09-23 DIAGNOSIS — Z825 Family history of asthma and other chronic lower respiratory diseases: Secondary | ICD-10-CM

## 2016-09-23 DIAGNOSIS — E875 Hyperkalemia: Secondary | ICD-10-CM | POA: Diagnosis present

## 2016-09-23 DIAGNOSIS — Z79899 Other long term (current) drug therapy: Secondary | ICD-10-CM

## 2016-09-23 DIAGNOSIS — N179 Acute kidney failure, unspecified: Secondary | ICD-10-CM | POA: Diagnosis present

## 2016-09-23 DIAGNOSIS — R161 Splenomegaly, not elsewhere classified: Secondary | ICD-10-CM | POA: Diagnosis present

## 2016-09-23 DIAGNOSIS — Z86010 Personal history of colonic polyps: Secondary | ICD-10-CM

## 2016-09-23 DIAGNOSIS — D696 Thrombocytopenia, unspecified: Secondary | ICD-10-CM | POA: Diagnosis present

## 2016-09-23 DIAGNOSIS — I129 Hypertensive chronic kidney disease with stage 1 through stage 4 chronic kidney disease, or unspecified chronic kidney disease: Secondary | ICD-10-CM | POA: Diagnosis present

## 2016-09-23 DIAGNOSIS — Z7901 Long term (current) use of anticoagulants: Secondary | ICD-10-CM | POA: Diagnosis not present

## 2016-09-23 DIAGNOSIS — I714 Abdominal aortic aneurysm, without rupture, unspecified: Secondary | ICD-10-CM | POA: Diagnosis present

## 2016-09-23 DIAGNOSIS — Z8679 Personal history of other diseases of the circulatory system: Secondary | ICD-10-CM

## 2016-09-23 DIAGNOSIS — Z87891 Personal history of nicotine dependence: Secondary | ICD-10-CM

## 2016-09-23 DIAGNOSIS — I4891 Unspecified atrial fibrillation: Secondary | ICD-10-CM | POA: Diagnosis present

## 2016-09-23 DIAGNOSIS — K921 Melena: Principal | ICD-10-CM | POA: Diagnosis present

## 2016-09-23 DIAGNOSIS — K31819 Angiodysplasia of stomach and duodenum without bleeding: Secondary | ICD-10-CM

## 2016-09-23 DIAGNOSIS — I482 Chronic atrial fibrillation: Secondary | ICD-10-CM | POA: Diagnosis present

## 2016-09-23 DIAGNOSIS — D500 Iron deficiency anemia secondary to blood loss (chronic): Secondary | ICD-10-CM | POA: Diagnosis present

## 2016-09-23 DIAGNOSIS — I10 Essential (primary) hypertension: Secondary | ICD-10-CM | POA: Diagnosis not present

## 2016-09-23 DIAGNOSIS — D62 Acute posthemorrhagic anemia: Secondary | ICD-10-CM | POA: Diagnosis present

## 2016-09-23 DIAGNOSIS — N182 Chronic kidney disease, stage 2 (mild): Secondary | ICD-10-CM | POA: Diagnosis present

## 2016-09-23 DIAGNOSIS — D689 Coagulation defect, unspecified: Secondary | ICD-10-CM | POA: Diagnosis not present

## 2016-09-23 DIAGNOSIS — E7800 Pure hypercholesterolemia, unspecified: Secondary | ICD-10-CM | POA: Diagnosis present

## 2016-09-23 DIAGNOSIS — K922 Gastrointestinal hemorrhage, unspecified: Secondary | ICD-10-CM | POA: Diagnosis present

## 2016-09-23 LAB — CBC WITH DIFFERENTIAL/PLATELET
Basophils Absolute: 0 10*3/uL (ref 0.0–0.1)
Basophils Relative: 1 %
Eosinophils Absolute: 0.2 10*3/uL (ref 0.0–0.7)
Eosinophils Relative: 2 %
HEMATOCRIT: 27.3 % — AB (ref 39.0–52.0)
HEMOGLOBIN: 8.9 g/dL — AB (ref 13.0–17.0)
LYMPHS ABS: 1 10*3/uL (ref 0.7–4.0)
LYMPHS PCT: 13 %
MCH: 31.2 pg (ref 26.0–34.0)
MCHC: 32.6 g/dL (ref 30.0–36.0)
MCV: 95.8 fL (ref 78.0–100.0)
MONOS PCT: 12 %
Monocytes Absolute: 0.9 10*3/uL (ref 0.1–1.0)
NEUTROS ABS: 5.4 10*3/uL (ref 1.7–7.7)
NEUTROS PCT: 72 %
Platelets: 101 10*3/uL — ABNORMAL LOW (ref 150–400)
RBC: 2.85 MIL/uL — AB (ref 4.22–5.81)
RDW: 16.3 % — ABNORMAL HIGH (ref 11.5–15.5)
WBC: 7.5 10*3/uL (ref 4.0–10.5)

## 2016-09-23 LAB — BASIC METABOLIC PANEL
Anion gap: 9 (ref 5–15)
BUN: 78 mg/dL — AB (ref 6–20)
CHLORIDE: 107 mmol/L (ref 101–111)
CO2: 24 mmol/L (ref 22–32)
CREATININE: 1.43 mg/dL — AB (ref 0.61–1.24)
Calcium: 8.8 mg/dL — ABNORMAL LOW (ref 8.9–10.3)
GFR calc Af Amer: 47 mL/min — ABNORMAL LOW (ref >60)
GFR calc non Af Amer: 41 mL/min — ABNORMAL LOW (ref >60)
Glucose, Bld: 126 mg/dL — ABNORMAL HIGH (ref 65–99)
Potassium: 5.2 mmol/L — ABNORMAL HIGH (ref 3.5–5.1)
Sodium: 140 mmol/L (ref 135–145)

## 2016-09-23 LAB — TYPE AND SCREEN
ABO/RH(D): A POS
Antibody Screen: NEGATIVE

## 2016-09-23 LAB — PROTIME-INR
INR: 2.01
Prothrombin Time: 23.1 seconds — ABNORMAL HIGH (ref 11.4–15.2)

## 2016-09-23 MED ORDER — PHYTONADIONE 5 MG PO TABS
5.0000 mg | ORAL_TABLET | Freq: Once | ORAL | Status: AC
  Administered 2016-09-23: 5 mg via ORAL
  Filled 2016-09-23: qty 1

## 2016-09-23 MED ORDER — ACETAMINOPHEN 650 MG RE SUPP: 650.0000 mg | Freq: Four times a day (QID) | RECTAL | Status: DC | PRN

## 2016-09-23 MED ORDER — HYDROCODONE-ACETAMINOPHEN 5-325 MG PO TABS: 1.0000 | ORAL_TABLET | ORAL | Status: DC | PRN

## 2016-09-23 MED ORDER — PANTOPRAZOLE SODIUM 40 MG IV SOLR: 40.0000 mg | Freq: Two times a day (BID) | INTRAVENOUS | Status: DC

## 2016-09-23 MED ORDER — SODIUM CHLORIDE 0.9% FLUSH
3.0000 mL | Freq: Two times a day (BID) | INTRAVENOUS | Status: DC
  Administered 2016-09-23 – 2016-09-24 (×2): 3 mL via INTRAVENOUS

## 2016-09-23 MED ORDER — ONDANSETRON HCL 4 MG/2ML IJ SOLN: 4.0000 mg | Freq: Four times a day (QID) | INTRAMUSCULAR | Status: DC | PRN

## 2016-09-23 MED ORDER — SODIUM CHLORIDE 0.9 % IV SOLN
INTRAVENOUS | Status: AC
  Administered 2016-09-23: 22:00:00 via INTRAVENOUS

## 2016-09-23 MED ORDER — SODIUM CHLORIDE 0.9 % IV SOLN
80.0000 mg | Freq: Once | INTRAVENOUS | Status: AC
  Administered 2016-09-23: 80 mg via INTRAVENOUS
  Filled 2016-09-23: qty 80

## 2016-09-23 MED ORDER — ACETAMINOPHEN 325 MG PO TABS: 650.0000 mg | ORAL_TABLET | Freq: Four times a day (QID) | ORAL | Status: DC | PRN

## 2016-09-23 MED ORDER — ATORVASTATIN CALCIUM 40 MG PO TABS
80.0000 mg | ORAL_TABLET | Freq: Every day | ORAL | Status: DC
  Administered 2016-09-24 – 2016-09-26 (×3): 80 mg via ORAL
  Filled 2016-09-23 (×3): qty 2

## 2016-09-23 MED ORDER — SODIUM CHLORIDE 0.9 % IV SOLN
8.0000 mg/h | INTRAVENOUS | Status: DC
  Administered 2016-09-23 – 2016-09-26 (×6): 8 mg/h via INTRAVENOUS
  Filled 2016-09-23 (×11): qty 80

## 2016-09-23 MED ORDER — ONDANSETRON HCL 4 MG PO TABS: 4.0000 mg | ORAL_TABLET | Freq: Four times a day (QID) | ORAL | Status: DC | PRN

## 2016-09-23 MED ORDER — METOPROLOL TARTRATE 25 MG PO TABS
25.0000 mg | ORAL_TABLET | Freq: Every day | ORAL | Status: DC
  Administered 2016-09-24 – 2016-09-26 (×3): 25 mg via ORAL
  Filled 2016-09-23 (×3): qty 1

## 2016-09-23 NOTE — ED Notes (Signed)
WILL TRANSPORT PT TO 5E 1511-1. AAOX4. PT IN NO APPARENT DISTRESS OR PAIN. THE OPPORTUNITY TO ASK QUESTIONS WAS PROVIDED.

## 2016-09-23 NOTE — ED Triage Notes (Signed)
Pt states he has had black stools x 3 weeks.  Pt had blood drawn today and was told to come here for low counts.  No pain.  Pt on blood thinners.

## 2016-09-23 NOTE — H&P (Signed)
Patrick [name] [id number] DOB: [birth year] DOA: 09/23/2016     PCP: Jerlyn Ly, MD   Outpatient Specialists: Justin Mend, [facility] Patient coming from:   home Lives  With family    Chief Complaint: melena  HPI: Patrick [name] is a 83 y.o. male with medical history significant of A.fib on coumadin, AAA , HTN, arthritis, colon polyps found in 2007 hemorrhoids, diverticulosis    Presented with 2 week history of black stools patient presented to his PCP for regular follow-up and was found to be anemic his Hg was 8.8 and he was sent  to emergency department. He has no prior history of anemia. Haven't had any chest pain no abdominal pain patient is on Coumadin for history of atrial fibrillation his INR has been checked on regular basis today it's 2.0. Denies any hematemesis never had an upper endoscopy but have had colonoscopy in the past which and also has history of hemorrhoids. Denies any syncope. Once in a while get a "tad dizzy"   he denies taking aspirin but took one Advil 2 days ago although melena has proceeded that.  Patient remains active but reports he's been tiring easily.   Regarding pertinent Chronic problems: Patient has long-standing of atrial fibrillation on Coumadin and metoprolol for rate control. He had had some leg edema and has been started on Lasix.  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  IN ER:  Temp (24hrs), Avg:97.7 F (36.5 C), Min:97.7 F (36.5 C), Max:97.7 F (36.5 C)    RR 20 HR 67 BP 128/78   INR 2.01 WBC 7.5 Hg 8.9 Plt 101 K 5.2 BUN 78 up from 29 CR 1.43  Following Medications were ordered in ER: Medications  phytonadione (VITAMIN K) tablet 5 mg (not administered)     ER provider discussed case with:  GI   Hospitalist was called for admission for symptomatic anemia secondary to blood loss associated with melena  Review of  Systems:    Pertinent positives include: melena fatigue,  Constitutional:  No weight loss, night sweats, Fevers, chills,  weight loss  HEENT:  No headaches, Difficulty swallowing,Tooth/dental problems,Sore throat,  No sneezing, itching, ear ache, nasal congestion, post nasal drip,  Cardio-vascular:  No chest pain, Orthopnea, PND, anasarca, dizziness, palpitations.no Bilateral lower extremity swelling  GI:  No heartburn, indigestion, abdominal pain, nausea, vomiting, diarrhea, change in bowel habits, loss of appetite,   blood in stool, hematemesis Resp:  no shortness of breath at rest. No dyspnea on exertion, No excess mucus, no productive cough, No non-productive cough, No coughing up of blood.No change in color of mucus.No wheezing. Skin:  no rash or lesions. No jaundice GU:  no dysuria, change in color of urine, no urgency or frequency. No straining to urinate.  No flank pain.  Musculoskeletal:  No joint pain or no joint swelling. No decreased range of motion. No back pain.  Psych:  No change in mood or affect. No depression or anxiety. No memory loss.  Neuro: no localizing neurological complaints, no tingling, no weakness, no double vision, no gait abnormality, no slurred speech, no confusion  As per HPI otherwise 10 point review of systems negative.   Past Medical History: Past Medical History:  Diagnosis Date  . AAA (abdominal aortic aneurysm) ([place])   . Adenomatous colon polyp 2004   TVA 04/2006  . Arthritis   .  Atrial fibrillation ([place])   . Diverticulosis   . Hemorrhoids   . Hypercholesterolemia   . Hypertension    Past Surgical History:  Procedure Laterality Date  . HERNIA REPAIR  Age 64  . TONSILLECTOMY     Very Young     Social History:  Ambulatory  independently      reports that he quit smoking about 47 years ago. His smoking use included Cigarettes. He has never used smokeless tobacco. He reports that he drinks alcohol. He reports that he does not use  drugs.  Allergies:  No Known Allergies     Family History:   Family History  Problem Relation Age of Onset  . Emphysema Father     Medications: Prior to Admission medications   Medication Sig Start Date End Date Taking? Authorizing Provider  atorvastatin (LIPITOR) 80 MG tablet Take 80 mg by mouth daily.     Yes Historical Provider, MD  furosemide (LASIX) 20 MG tablet Take 2 tablets (40 mg total) by mouth 3 (three) times a week. Take on Mon, Wed, Fri Patient taking differently: Take 40 mg by mouth every Monday, Wednesday, and Friday. Take on Mon, Wed, Fri 10/06/15  Yes Thayer Headings, MD  lisinopril (PRINIVIL,ZESTRIL) 5 MG tablet Take 5 mg by mouth daily.     Yes Historical Provider, MD  metoprolol tartrate (LOPRESSOR) 25 MG tablet Take 25 mg by mouth daily. 03/04/16  Yes Historical Provider, MD  neomycin-polymyxin b-dexamethasone (MAXITROL) 3.5-10000-0.1 SUSP Place 1 drop into both eyes 4 (four) times daily. Started 1/22 x 5 days 09/19/16  Yes Historical Provider, MD  Omega-3 Fatty Acids (FISH OIL) 1200 MG CAPS Take 1 capsule by mouth daily.    Yes Historical Provider, MD  Polyethyl Glycol-Propyl Glycol (SYSTANE OP) Apply 1 drop to eye 2 (two) times daily as needed (dry eyes).    Yes Historical Provider, MD  Polyethylene Glycol 3350 (MIRALAX PO) Take by mouth 2 (two) times a week.     Yes Historical Provider, MD  potassium chloride (K-DUR) 10 MEQ tablet Take 2 tablets (20 mEq total) by mouth 3 (three) times a week. Take on Mon, Wed, Fri 10/06/15  Yes Thayer Headings, MD  warfarin (COUMADIN) 2 MG tablet Take 2 mg by mouth daily. Take 2mg  along with the 5mg  tablet every day of the week, except for Tuesdays. Only take 5mg  on Tuesdays.   Yes Historical Provider, MD  warfarin (COUMADIN) 5 MG tablet Take 5 mg by mouth daily. Take 5mg  by mouth along with the 2mg  tablet every day of the week, except for Tuesdays. Take only 5mg  by mouth on tuesdays.   Yes Historical Provider, MD    Physical  Exam: Patient Vitals for the past 24 hrs:  BP Temp Temp src Pulse Resp SpO2 Height Weight  09/23/16 1950 - - - - - - 5\' 9"  (1.753 m) 77.1 kg (170 lb)  09/23/16 1839 128/78 - - 67 20 100 % - -  09/23/16 1546 117/74 97.7 F (36.5 C) Oral 71 18 100 % - -    1. General:  in No Acute distress 2. Psychological: Alert and   Oriented 3. Head/ENT:    Dry Mucous Membranes                          Head Non traumatic, neck supple  Poor Dentition 4. SKIN:   decreased Skin turgor,  Skin clean Dry and intact no rash 5. Heart: irRegular rate and rhythm no  Murmur, Rub or gallop 6. Lungs:  Clear to auscultation bilaterally, no wheezes or crackles   7. Abdomen: Soft,  non-tender, Non distended 8. Lower extremities: no clubbing, cyanosis, or edema 9. Neurologically Grossly intact, moving all 4 extremities equally   10. MSK: Normal range of motion   body mass index is 25.1 kg/m.  Labs on Admission:   Labs on Admission: I have personally reviewed following labs and imaging studies  CBC:  Recent Labs Lab 09/23/16 1747  WBC 7.5  NEUTROABS 5.4  HGB 8.9*  HCT 27.3*  MCV 95.8  PLT 99991111*   Basic Metabolic Panel:  Recent Labs Lab 09/23/16 1747  NA 140  K 5.2*  CL 107  CO2 24  GLUCOSE 126*  BUN 78*  CREATININE 1.43*  CALCIUM 8.8*   GFR: Estimated Creatinine Clearance: 32.3 mL/min (by C-G formula based on SCr of 1.43 mg/dL (H)). Liver Function Tests: No results for input(s): AST, ALT, ALKPHOS, BILITOT, PROT, ALBUMIN in the last 168 hours. No results for input(s): LIPASE, AMYLASE in the last 168 hours. No results for input(s): AMMONIA in the last 168 hours. Coagulation Profile:  Recent Labs Lab 09/23/16 1747  INR 2.01   Cardiac Enzymes: No results for input(s): CKTOTAL, CKMB, CKMBINDEX, TROPONINI in the last 168 hours. BNP (last 3 results) No results for input(s): PROBNP in the last 8760 hours. HbA1C: No results for input(s): HGBA1C in the last 72  hours. CBG: No results for input(s): GLUCAP in the last 168 hours. Lipid Profile: No results for input(s): CHOL, HDL, LDLCALC, TRIG, CHOLHDL, LDLDIRECT in the last 72 hours. Thyroid Function Tests: No results for input(s): TSH, T4TOTAL, FREET4, T3FREE, THYROIDAB in the last 72 hours. Anemia Panel: No results for input(s): VITAMINB12, FOLATE, FERRITIN, TIBC, IRON, RETICCTPCT in the last 72 hours. Urine analysis: No results found for: COLORURINE, APPEARANCEUR, LABSPEC, PHURINE, GLUCOSEU, HGBUR, BILIRUBINUR, KETONESUR, PROTEINUR, UROBILINOGEN, NITRITE, LEUKOCYTESUR Sepsis Labs: @LABRCNTIP (procalcitonin:4,lacticidven:4) )No results found for this or any previous visit (from the past 240 hour(s)).     UA  not ordered  No results found for: HGBA1C  Estimated Creatinine Clearance: 32.3 mL/min (by C-G formula based on SCr of 1.43 mg/dL (H)).  BNP (last 3 results) No results for input(s): PROBNP in the last 8760 hours.   ECG REPORT  Independently reviewed Rate: 75  Rhythm: A.fib ST&T Change: No acute ischemic changes   QTC 490  Filed Weights   09/23/16 1950  Weight: 77.1 kg (170 lb)     Cultures: No results found for: SDES, SPECREQUEST, CULT, REPTSTATUS   Radiological Exams on Admission: No results found.  Chart has been reviewed    Assessment/Plan  83 y.o. male with medical history significant of A.fib on coumadin, AAA , HTN, arthritis, colon polyps found in 2007 hemorrhoids, diverticulosis admitted for  symptomatic anemia secondary to blood loss associated with melena  Present on Admission: . Upper GI bleed -    -in the setting of   anticoagulation,      Hold anticoagulation given vitamin K,  Given no evidence of hemodynamic instability and chronic nature of blood loss will hold off on FFP last Coumadin dose was yesterday 1/25 recheck INR in AM   -  ER  Provider spoke to gastroenterology  they will see patient in a.m. appreciate their consult   - clear liquids for  tonight  keep nothing by mouth post midnight,   -  administer Protonix  drip       - serial CBC.    - Monitor for any recurrence,  evidence of hemodynamic instability or significant blood loss  - Transfuse as needed for hemoglobin below 7 or evidence of life-threatening bleeding  . Atrial fibrillation ([place]) -  .Chronic Atrial fibrillation ([facility])        - CHA2DS2 vas score 3:  Not on anticoagulation secondary to  bleeding will hold for now        -  Rate control:  Currently controlled with  Metoprolol   . HTN (hypertension) hold lisinopril given hyperkalemia   . AAA (abdominal aortic aneurysm) ([place]) stable will need to follow-up as scheduled with vascular . Blood loss anemia -patient is minimally symptomatic will continue to monitor transfuse as indicated . Hyperkalemia mild will hold potassium and lisinopril give gentle fluids and recheck monitor on telemetry    Other plan as per orders.  DVT prophylaxis:  SCD    Code Status:  FULL CODE   as per patient    Family Communication:   Family   at  Bedside  plan of care was discussed with  partner  Disposition Plan:      To home once workup is complete and patient is stable                                                 Consults called: [facility]  Will see in consult in AM  Admission status:  inpatient       Level of care    tele        I have spent a total of 57 min on this admission   Daryana Whirley 09/23/2016, 8:54 PM    [facility]  Pager [phone]   after 2 AM please page floor coverage PA If 7AM-7PM, please contact the day team taking care of the patient  [email or web address]  Password [email or web address]

## 2016-09-23 NOTE — ED Notes (Signed)
Admitting Provider at bedside. 

## 2016-09-23 NOTE — ED Provider Notes (Addendum)
[facility] DEPT Provider Note   CSN: [id number] Arrival date & time: 09/23/16  1538     History   Chief Complaint Chief Complaint  Patient presents with  . Melena    HPI Patrick [name] is a 83 y.o. male. Chief complaint is black stools and anemia.  HPI:  This is a delightful 83 year old male who presents via private car with a complaint of black stools and a low blood count.  Patient has a history of permanent nature fibrillation is antegrade [place] with Coumadin. Has no history of anemia. Has had previous colonoscopy with polyps. His never had an upper endoscopy. His never had ulcers or any source of upper GI bleeding.  He states that anywhere from 1-2 weeks ago he started noticing that her stools were dark. He states they started out just dark and now are somewhat black. He's had some subtle fatigue. He has not been syncopal or presyncopal.  He sees Dr. Hardie Shackleton at [place]. He made an appointment and saw Dr. Haynes Kerns today. His blood count was 8.8. I do not have previous. However, Dr. Loletha Grayer included his records with the patient. Patient sees Dr. Maggie Font never fourth months. There is no her anemia and the patient's chart, and he does not recall a history of anemia.  He does not have abdominal pain.  Hb was 8.8 today. INR 2.2.  Patient denies history of liver disease. Does not take anti-inflammatories or aspirin. No excessive alcohol use. He is a nonsmoker.  He takes Toprol for rate control for his A. fib.  Past Medical History:  Diagnosis Date  . AAA (abdominal aortic aneurysm) ([place])   . Adenomatous colon polyp 2004   TVA 04/2006  . Arthritis   . Atrial fibrillation ([place])   . Diverticulosis   . Hemorrhoids   . Hypercholesterolemia   . Hypertension     Patient Active Problem List   Diagnosis Date Noted  . HTN (hypertension) 08/08/2014  . AAA (abdominal aortic aneurysm) ([place]) 05/03/2013  . 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 Abdominal  aneurysm without mention of rupture 08/06/2012  . Atrial fibrillation ([place]) 06/15/2009  . PERSONAL HISTORY OF COLONIC POLYPS 06/15/2009    Past Surgical History:  Procedure Laterality Date  . HERNIA REPAIR  Age 43  . TONSILLECTOMY     Very Young       Home Medications    Prior to Admission medications   Medication Sig Start Date End Date Taking? Authorizing Provider  atorvastatin (LIPITOR) 80 MG tablet Take 80 mg by mouth daily.     Yes Historical Provider, MD  furosemide (LASIX) 20 MG tablet Take 2 tablets (40 mg total) by mouth 3 (three) times a week. Take on Mon, Wed, Fri Patient taking differently: Take 40 mg by mouth every Monday, Wednesday, and Friday. Take on Mon, Wed, Fri 10/06/15  Yes Thayer Headings, MD  lisinopril (PRINIVIL,ZESTRIL) 5 MG tablet Take 5 mg by mouth daily.     Yes Historical Provider, MD  metoprolol tartrate (LOPRESSOR) 25 MG tablet Take 25 mg by mouth daily. 03/04/16  Yes Historical Provider, MD  neomycin-polymyxin b-dexamethasone (MAXITROL) 3.5-10000-0.1 SUSP Place 1 drop into both eyes 4 (four) times daily. Started 1/22 x 5 days 09/19/16  Yes Historical Provider, MD  Omega-3 Fatty Acids (FISH OIL) 1200 MG CAPS Take 1 capsule by mouth daily.    Yes Historical Provider, MD  Polyethyl Glycol-Propyl Glycol (SYSTANE OP) Apply 1 drop to eye 2 (two) times  daily as needed (dry eyes).    Yes Historical Provider, MD  Polyethylene Glycol 3350 (MIRALAX PO) Take by mouth 2 (two) times a week.     Yes Historical Provider, MD  potassium chloride (K-DUR) 10 MEQ tablet Take 2 tablets (20 mEq total) by mouth 3 (three) times a week. Take on Mon, Wed, Fri 10/06/15  Yes Thayer Headings, MD  warfarin (COUMADIN) 2 MG tablet Take 2 mg by mouth daily. Take 2mg  along with the 5mg  tablet every day of the week, except for Tuesdays. Only take 5mg  on Tuesdays.   Yes Historical Provider, MD  warfarin (COUMADIN) 5 MG tablet Take 5 mg by mouth daily. Take 5mg  by mouth along with the 2mg  tablet  every day of the week, except for Tuesdays. Take only 5mg  by mouth on tuesdays.   Yes Historical Provider, MD    Family History Family History  Problem Relation Age of Onset  . Emphysema Father     Social History Social History  Substance Use Topics  . Smoking status: Former Smoker    Types: Cigarettes    Quit date: 08/29/1969  . Smokeless tobacco: Never Used  . Alcohol use Yes     Comment: glass of wine 1-2 times per week     Allergies   Patient has no known allergies.   Review of Systems Review of Systems  Constitutional: Negative for appetite change, chills, diaphoresis, fatigue and fever.  HENT: Negative for mouth sores, sore throat and trouble swallowing.   Eyes: Negative for visual disturbance.  Respiratory: Negative for cough, chest tightness, shortness of breath and wheezing.   Cardiovascular: Negative for chest pain.  Gastrointestinal: Negative for abdominal distention, abdominal pain, diarrhea, nausea and vomiting.       Dark stools  Endocrine: Negative for polydipsia, polyphagia and polyuria.  Genitourinary: Negative for dysuria, frequency and hematuria.  Musculoskeletal: Negative for gait problem.  Skin: Negative for color change, pallor and rash.  Neurological: Positive for weakness. Negative for dizziness, syncope, light-headedness and headaches.  Hematological: Does not bruise/bleed easily.  Psychiatric/Behavioral: Negative for behavioral problems and confusion.     Physical Exam Updated Vital Signs BP 128/78   Pulse 67   Temp 97.7 F (36.5 C) (Oral)   Resp 20   Ht 5\' 9"  (1.753 m)   Wt 170 lb (77.1 kg)   SpO2 100%   BMI 25.10 kg/m   Physical Exam  Constitutional: He is oriented to person, place, and time. He appears well-developed and well-nourished. No distress.  83 year old male who appears and interacts much younger than his stated age of 48.  HENT:  Head: Normocephalic.  Eyes: Conjunctivae are normal. Pupils are equal, round, and reactive  to light. No scleral icterus.  Conjunctiva pale  Neck: Normal range of motion. Neck supple. No thyromegaly present.  Cardiovascular: Normal rate and regular rhythm.  Exam reveals no gallop and no friction rub.   No murmur heard. Pulmonary/Chest: Effort normal and breath sounds normal. No respiratory distress. He has no wheezes. He has no rales.  Abdominal: Soft. Bowel sounds are normal. He exhibits no distension. There is no tenderness. There is no rebound.  Soft benign abdomen. Black stool, immediately guaiac +.   Musculoskeletal: Normal range of motion.  Neurological: He is alert and oriented to person, place, and time.  Skin: Skin is warm and dry. No rash noted.  Psychiatric: He has a normal mood and affect. His behavior is normal.     ED Treatments / Results  Labs (all labs ordered are listed, but only abnormal results are displayed) Labs Reviewed  PROTIME-INR - Abnormal; Notable for the following:       Result Value   Prothrombin Time 23.1 (*)    All other components within normal limits  CBC WITH DIFFERENTIAL/PLATELET - Abnormal; Notable for the following:    RBC 2.85 (*)    Hemoglobin 8.9 (*)    HCT 27.3 (*)    RDW 16.3 (*)    Platelets 101 (*)    All other components within normal limits  BASIC METABOLIC PANEL - Abnormal; Notable for the following:    Potassium 5.2 (*)    Glucose, Bld 126 (*)    BUN 78 (*)    Creatinine, Ser 1.43 (*)    Calcium 8.8 (*)    GFR calc non Af Amer 41 (*)    GFR calc Af Amer 47 (*)    All other components within normal limits  TYPE AND SCREEN    EKG  EKG Interpretation None       Radiology No results found.  Procedures Procedures (including critical care time)  Medications Ordered in ED Medications - No data to display   Initial Impression / Assessment and Plan / ED Course  I have reviewed the triage vital signs and the nursing notes.  Pertinent labs & imaging results that were available during my care of the patient  were reviewed by me and considered in my medical decision making (see chart for details).   GI bleed with and regular patient. We'll give vitamin K. Type and cross. Does not need transfusion currently. He is not tachycardic, but is rate controlled with beta blocker. Not hypotensive. Minimally symptomatic. We'll discussed with hospitalist regarding admission, she had consultation, serial hemoglobins.  GI physician is Dr. Fuller Plan at [facility] gastroenterology.  Discussed with Dr. Darnell Level. Patient will be admitted. I placed a call to on-call for lower GI. I spoke with Dr. Henrene Pastor the. He agrees with Protonix infusion. He was in the patient in consult tomorrow morning.    Final Clinical Impressions(s) / ED Diagnoses   Final diagnoses:  Melena    CRITICAL CARE Performed by: Tanna Furry JOSEPH   Total critical care time: 30 minutes  Critical care time was exclusive of separately billable procedures and treating other patients.  Critical care was necessary to treat or prevent imminent or life-threatening deterioration.  Critical care was time spent personally by me on the following activities: development of treatment plan with patient and/or surrogate as well as nursing, discussions with consultants, evaluation of patient's response to treatment, examination of patient, obtaining history from patient or surrogate, ordering and performing treatments and interventions, ordering and review of laboratory studies, ordering and review of radiographic studies, pulse oximetry and re-evaluation of patient's condition.   New Prescriptions New Prescriptions   No medications on file     Tanna Furry, MD 09/23/16 2010    Tanna Furry, MD 09/23/16 2049    Tanna Furry, MD 09/23/16 2246

## 2016-09-24 DIAGNOSIS — I10 Essential (primary) hypertension: Secondary | ICD-10-CM

## 2016-09-24 DIAGNOSIS — D689 Coagulation defect, unspecified: Secondary | ICD-10-CM

## 2016-09-24 DIAGNOSIS — I482 Chronic atrial fibrillation: Secondary | ICD-10-CM

## 2016-09-24 DIAGNOSIS — K921 Melena: Principal | ICD-10-CM

## 2016-09-24 DIAGNOSIS — D500 Iron deficiency anemia secondary to blood loss (chronic): Secondary | ICD-10-CM

## 2016-09-24 LAB — FERRITIN: FERRITIN: 43 ng/mL (ref 24–336)

## 2016-09-24 LAB — CBC
HEMATOCRIT: 23.1 % — AB (ref 39.0–52.0)
HEMATOCRIT: 24.5 % — AB (ref 39.0–52.0)
HEMATOCRIT: 24.7 % — AB (ref 39.0–52.0)
Hemoglobin: 7.6 g/dL — ABNORMAL LOW (ref 13.0–17.0)
Hemoglobin: 8.2 g/dL — ABNORMAL LOW (ref 13.0–17.0)
Hemoglobin: 8.2 g/dL — ABNORMAL LOW (ref 13.0–17.0)
MCH: 31.5 pg (ref 26.0–34.0)
MCH: 32.5 pg (ref 26.0–34.0)
MCH: 32.5 pg (ref 26.0–34.0)
MCHC: 32.9 g/dL (ref 30.0–36.0)
MCHC: 33.5 g/dL (ref 30.0–36.0)
MCHC: 33.2 g/dL (ref 30.0–36.0)
MCV: 95.9 fL (ref 78.0–100.0)
MCV: 97.2 fL (ref 78.0–100.0)
MCV: 98 fL (ref 78.0–100.0)
PLATELETS: 87 10*3/uL — AB (ref 150–400)
Platelets: 73 10*3/uL — ABNORMAL LOW (ref 150–400)
Platelets: 82 10*3/uL — ABNORMAL LOW (ref 150–400)
RBC: 2.41 MIL/uL — AB (ref 4.22–5.81)
RBC: 2.52 MIL/uL — ABNORMAL LOW (ref 4.22–5.81)
RBC: 2.52 MIL/uL — ABNORMAL LOW (ref 4.22–5.81)
RDW: 16.4 % — AB (ref 11.5–15.5)
RDW: 16.7 % — ABNORMAL HIGH (ref 11.5–15.5)
RDW: 16.7 % — AB (ref 11.5–15.5)
WBC: 4.2 10*3/uL (ref 4.0–10.5)
WBC: 5.2 10*3/uL (ref 4.0–10.5)
WBC: 5.5 10*3/uL (ref 4.0–10.5)

## 2016-09-24 LAB — RETICULOCYTES
RBC.: 2.64 MIL/uL — ABNORMAL LOW (ref 4.22–5.81)
RETIC CT PCT: 3.8 % — AB (ref 0.4–3.1)
Retic Count, Absolute: 100.3 10*3/uL (ref 19.0–186.0)

## 2016-09-24 LAB — IRON AND TIBC
Iron: 34 ug/dL — ABNORMAL LOW (ref 45–182)
Saturation Ratios: 9 % — ABNORMAL LOW (ref 17.9–39.5)
TIBC: 389 ug/dL (ref 250–450)
UIBC: 355 ug/dL

## 2016-09-24 LAB — COMPREHENSIVE METABOLIC PANEL
ALBUMIN: 3.2 g/dL — AB (ref 3.5–5.0)
ALK PHOS: 121 U/L (ref 38–126)
ALT: 18 U/L (ref 17–63)
ANION GAP: 7 (ref 5–15)
AST: 27 U/L (ref 15–41)
BILIRUBIN TOTAL: 1.6 mg/dL — AB (ref 0.3–1.2)
BUN: 72 mg/dL — AB (ref 6–20)
CO2: 26 mmol/L (ref 22–32)
Calcium: 8.4 mg/dL — ABNORMAL LOW (ref 8.9–10.3)
Chloride: 110 mmol/L (ref 101–111)
Creatinine, Ser: 1.14 mg/dL (ref 0.61–1.24)
GFR calc non Af Amer: 53 mL/min — ABNORMAL LOW (ref >60)
GLUCOSE: 107 mg/dL — AB (ref 65–99)
Potassium: 4 mmol/L (ref 3.5–5.1)
Sodium: 143 mmol/L (ref 135–145)
Total Protein: 5.7 g/dL — ABNORMAL LOW (ref 6.5–8.1)

## 2016-09-24 LAB — MAGNESIUM: MAGNESIUM: 2.2 mg/dL (ref 1.7–2.4)

## 2016-09-24 LAB — FOLATE: Folate: 20.2 ng/mL (ref >5.9)

## 2016-09-24 LAB — PROTIME-INR
INR: 1.78
Prothrombin Time: 20.9 seconds — ABNORMAL HIGH (ref 11.4–15.2)

## 2016-09-24 LAB — ABO/RH: ABO/RH(D): A POS

## 2016-09-24 LAB — PHOSPHORUS: Phosphorus: 3.9 mg/dL (ref 2.5–4.6)

## 2016-09-24 LAB — TSH: TSH: 3.595 u[IU]/mL (ref 0.350–4.500)

## 2016-09-24 NOTE — Consult Note (Signed)
[facility] Consult: 1:46 PM 09/24/2016  LOS: 1 day    Referring Provider: Dr Wynelle Cleveland  Primary Care Physician:  Jerlyn Ly, MD Primary Gastroenterologist:  Dr. Fuller Plan    Reason for Consultation:  Black stools and acute anemia.   HPI: Patrick [name] is a 83 y.o. male.  PMH atrial fibrillation, on Coumadin. 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 Hypertension. Arthritis. Colon polyps.  Thrombocytopenia dating back to 2008. History of adenomatous colon polyps dating back to 2004. Had recurrent polyps in 2007. Most recent colonoscopy 2010: Dr. Lucio Edward. 2 follow-up 2007 history of tubulovillous adenoma. Several sessile polyps encountered. Size 2-5 mm, locations in cecum and transverse, hepatic flexure, rectum. No records of previous upper endoscopy. 05/16/2016 ultrasound showed minimal ascites but no other abnormalities.  On PCP office visit yesterday, 09/23/16 he gave a history of black stools for 2 weeks and felt a "tad dizzy".  No syncope or falls.  Appetite and weight have been stable. No nausea or vomiting. No indigestion. No dysphagia. He doesn't recall having undergone previous upper endoscopy. Not aware of any previous diagnosis of ulcer disease.  Meds include the Coumadin but no aspirin.  A few days ago he took an Advil, which she doesn't normally do very often. But the black stools well predated his use of that Advil. He doesn't take a PPI or H2 blocker as he has no symptoms which would necessitate these.  This based hgb 8.8 and he was sent to the emergency room. He has been admitted, Protonix drip initiated. Received 5mg  PO vitamin K.  No transfusions to date.   Hgb 7.6 >> 8.2 this morning.  Platelets 73 >> 82. Coags 23.1/2.0 >> 20/1.7.     Past Medical History:  Diagnosis Date  . AAA (abdominal  aortic aneurysm) ([place])   . Adenomatous colon polyp 2004   TVA 04/2006  . Arthritis   . Atrial fibrillation ([place])   . Diverticulosis   . Hemorrhoids   . Hypercholesterolemia   . Hypertension     Past Surgical History:  Procedure Laterality Date  . HERNIA REPAIR  Age 23  . TONSILLECTOMY     Very Young    Prior to Admission medications   Medication Sig Start Date End Date Taking? Authorizing Provider  atorvastatin (LIPITOR) 80 MG tablet Take 80 mg by mouth daily.     Yes Historical Provider, MD  furosemide (LASIX) 20 MG tablet Take 2 tablets (40 mg total) by mouth 3 (three) times a week. Take on Mon, Wed, Fri Patient taking differently: Take 40 mg by mouth every Monday, Wednesday, and Friday. Take on Mon, Wed, Fri 10/06/15  Yes Thayer Headings, MD  lisinopril (PRINIVIL,ZESTRIL) 5 MG tablet Take 5 mg by mouth daily.     Yes Historical Provider, MD  metoprolol tartrate (LOPRESSOR) 25 MG tablet Take 25 mg by mouth daily. 03/04/16  Yes Historical Provider, MD  neomycin-polymyxin b-dexamethasone (MAXITROL) 3.5-10000-0.1 SUSP Place 1 drop into both eyes 4 (four) times daily. Started 1/22 x 5 days 09/19/16  Yes Historical  Provider, MD  Omega-3 Fatty Acids (FISH OIL) 1200 MG CAPS Take 1 capsule by mouth daily.    Yes Historical Provider, MD  Polyethyl Glycol-Propyl Glycol (SYSTANE OP) Apply 1 drop to eye 2 (two) times daily as needed (dry eyes).    Yes Historical Provider, MD  Polyethylene Glycol 3350 (MIRALAX PO) Take by mouth 2 (two) times a week.     Yes Historical Provider, MD  potassium chloride (K-DUR) 10 MEQ tablet Take 2 tablets (20 mEq total) by mouth 3 (three) times a week. Take on Mon, Wed, Fri 10/06/15  Yes Thayer Headings, MD  warfarin (COUMADIN) 2 MG tablet Take 2 mg by mouth daily. Take 2mg  along with the 5mg  tablet every day of the week, except for Tuesdays. Only take 5mg  on Tuesdays.   Yes Historical Provider, MD  warfarin (COUMADIN) 5 MG tablet Take 5 mg by mouth daily. Take 5mg  by  mouth along with the 2mg  tablet every day of the week, except for Tuesdays. Take only 5mg  by mouth on tuesdays.   Yes Historical Provider, MD    Scheduled Meds: . atorvastatin  80 mg Oral Daily  . metoprolol tartrate  25 mg Oral Daily  . [START ON 09/27/2016] pantoprazole  40 mg Intravenous Q12H  . sodium chloride flush  3 mL Intravenous Q12H   Infusions: . pantoprozole (PROTONIX) infusion 8 mg/hr (09/24/16 0928)   PRN Meds: acetaminophen **OR** acetaminophen, HYDROcodone-acetaminophen, ondansetron **OR** ondansetron (ZOFRAN) IV   Allergies as of 09/23/2016  . (No Known Allergies)    Family History  Problem Relation Age of Onset  . Emphysema Father   . Diabetes Neg Hx   . Cancer Neg Hx     Social History   Social History  . Marital status: Divorced    Spouse name: N/A  . Number of children: 3  . Years of education: N/A   Occupational History  . retired Retired   Social History Main Topics  . Smoking status: Former Smoker    Types: Cigarettes    Quit date: 08/29/1969  . Smokeless tobacco: Never Used  . Alcohol use Yes     Comment: glass of wine 1-2 times per week  . Drug use: No  . Sexual activity: Not on file   Other Topics Concern  . Not on file   Social History Narrative  . No narrative on file    REVIEW OF SYSTEMS: Constitutional:  Generally patient is strong. He reports no weakness. ENT:  No nose bleeds Pulm:  Stable dyspnea on exertion. More than a few steps and walking more than about 20 feet cause shortness of breath. No productive cough. CV:  No studies having palpitations.  Pedal edema controlled so long as he takes his diuretics. No chest pain  GU:  No hematuria, no frequency GI:  Per HPI Heme:  No unusual bleeding or bruising.   Transfusions:  Never received blood transfusions. Neuro:  No headaches, no peripheral tingling or numbness Derm:  No itching, no rash or sores.  Endocrine:  No sweats or chills.  No polyuria or dysuria Immunization:   Didn't get a flu shot this year. Travel:  None beyond local counties in last few months.    PHYSICAL EXAM: Vital signs in last 24 hours: Vitals:   09/24/16 0640 09/24/16 0819  BP: 112/68 115/63  Pulse: 62 66  Resp: 20 19  Temp: 98.3 F (36.8 C) 98.3 F (36.8 C)   Wt Readings from Last 3 Encounters:  09/23/16  77.1 kg (170 lb)  09/02/16 75.8 kg (167 lb)  08/10/16 75.8 kg (167 lb)    General: Pleasant, looks well. Looks 69 years younger than stated age. Head:  No signs of head trauma. No facial asymmetry or swelling.  Eyes:  No scleral icterus, no conjunctival pallor. Ears:  Slight hearing deficit.  Nose:  No congestion or discharge Mouth:  Moist, clear oral mucosa. No blood. Tongue is midline. Neck:  No JVD, masses or thyromegaly. Lungs:  There are bilaterally. No cough or labored breathing Heart: Irregularly irregular but rate is controlled. No MRG. S1, S2 present. Abdomen:  Soft. No organomegaly, tenderness, bruits, hernias. Nondistended. Active bowel sounds..   Rectal: Deferred rectal exam.   Musc/Skeltl: No joint swelling, erythema or gross deformities. Some arthritic changes in the fingers/hands. Extremities:  No CCE.  Neurologic:  Alert. Oriented times 3. No tremor. I'm weakness.  It historian Skin:  No telangiectasia no suspicious lesions. No sores or rashes. Tattoos:  None observed. Nodes:  Cervical adenopathy.   Psych:  Present. Fully alert and engaged. Calm.  Intake/Output from previous day: 01/26 0701 - 01/27 0700 In: 240 [P.O.:240] Out: -  Intake/Output this shift: No intake/output data recorded.  LAB RESULTS:  Recent Labs  09/23/16 1747 09/24/16 0645 09/24/16 1129  WBC 7.5 4.2 5.2  HGB 8.9* 7.6* 8.2*  HCT 27.3* 23.1* 24.5*  PLT 101* 73* 82*   BMET Lab Results  Component Value Date   NA 143 09/24/2016   NA 140 09/23/2016   NA 141 11/03/2015   K 4.0 09/24/2016   K 5.2 (H) 09/23/2016   K 4.4 11/03/2015   CL 110 09/24/2016   CL 107  09/23/2016   CL 105 11/03/2015   CO2 26 09/24/2016   CO2 24 09/23/2016   CO2 24 11/03/2015   GLUCOSE 107 (H) 09/24/2016   GLUCOSE 126 (H) 09/23/2016   GLUCOSE 119 (H) 11/03/2015   BUN 72 (H) 09/24/2016   BUN 78 (H) 09/23/2016   BUN 29 (H) 11/03/2015   CREATININE 1.14 09/24/2016   CREATININE 1.43 (H) 09/23/2016   CREATININE 1.27 (H) 11/03/2015   CALCIUM 8.4 (L) 09/24/2016   CALCIUM 8.8 (L) 09/23/2016   CALCIUM 8.4 (L) 11/03/2015   LFT  Recent Labs  09/24/16 0645  PROT 5.7*  ALBUMIN 3.2*  AST 27  ALT 18  ALKPHOS 121  BILITOT 1.6*   PT/INR Lab Results  Component Value Date   INR 1.78 09/24/2016   INR 2.01 09/23/2016   INR 2.30 (H) 07/01/2015   Hepatitis Panel No results for input(s): HEPBSAG, HCVAB, HEPAIGM, HEPBIGM in the last 72 hours. C-Diff No components found for: CDIFF Lipase  No results found for: LIPASE  Drugs of Abuse  No results found for: LABOPIA, COCAINSCRNUR, LABBENZ, AMPHETMU, THCU, LABBARB   RADIOLOGY STUDIES: No results found.   IMPRESSION:   *  Upper GI bleed. Rule out ulcer disease, rule out AVMs, rule out neoplasia.  *  Blood loss anemia, acute  *  History of atrial fibrillation. His chronic Coumadin is now on hold. INR is subtherapeutic following oral vitamin K.  *  History adenomatous colon polyps 2004, 2007, 2010  *  On standing thrombocytopenia.  Splenomegaly not documented on ultrasound in 9/201 7.    PLAN:     *  Try to aim for upper endoscopy tomorrow if schedule will allow.  He can continue his diet today. We may have to defer the endoscopy until 1/29 due to scheduling conflicts.  Continue the IV Protonix  Azucena Freed  09/24/2016, 1:46 PM Pager: [phone]  GI ATTENDING  History, laboratories, x-rays, prior endoscopy reports reviewed. Patient personally seen and examined. Wife in room. Patient presents with acute upper GI bleed. On chronic Coumadin. INR is correcting. Stable. Continue PPI. EGD likely tomorrow. The  patient is high-risk given his age and comorbidities.The nature of the procedure, as well as the risks, benefits, and alternatives were carefully and thoroughly reviewed with the patient. Ample time for discussion and questions allowed. The patient understood, was satisfied, and agreed to proceed.  Docia Chuck. Geri Seminole., M.D. [facility] Division of Gastroenterology.

## 2016-09-24 NOTE — Progress Notes (Signed)
NPO

## 2016-09-24 NOTE — Progress Notes (Signed)
PROGRESS NOTE    Patrick [name]   [id number]  DOB: 03-18-1921  DOA: 09/23/2016 PCP: Jerlyn Ly, MD   Brief Narrative:  Patrick [name] is a 83 y.o. male with medical history significant of A.fib on coumadin, AAA , HTN, arthritis, colon polyps found in 2007 hemorrhoids, diverticulosis presented with 2 week history of black stools. No abdominal pian, vomiting or weight loss. No h/o PUD.  Subjective: No complaints. Last black stool was 2 days ago.   Assessment & Plan:   Principal Problem:   Melena - in setting of Coumadin - GI to evaluate- cont Protonix - no bleeding noted in 2 days- no h/o PUD  Active Problems:   Acute Blood loss anemia and Acute thrombocytopenia - transfuse 1 u PRBC today - follow platelets    Atrial fibrillation   - cont Metoprolol- hold Coumadin     AAA (abdominal aortic aneurysm)     HTN (hypertension) - cont Metoprolol - hold Lisinopril    Hyperkalemia - K 5.2 on admission- has resolved  DVT prophylaxis: SCD Code Status: Full code Family Communication:  Disposition Plan: home when stable Consultants:   GI Procedures:    Antimicrobials:  Anti-infectives    None       Objective: Vitals:   09/23/16 2220 09/24/16 0328 09/24/16 0640 09/24/16 0819  BP: 120/69 115/61 112/68 115/63  Pulse: 73 60 62 66  Resp: 20 19 20 19   Temp: 98.2 F (36.8 C) 97.9 F (36.6 C) 98.3 F (36.8 C) 98.3 F (36.8 C)  TempSrc: Oral Oral Oral Oral  SpO2: 100% 100% 100% 99%  Weight:      Height:        Intake/Output Summary (Last 24 hours) at 09/24/16 1230 Last data filed at 09/23/16 2300  Gross per 24 hour  Intake              240 ml  Output                0 ml  Net              240 ml   Filed Weights   09/23/16 1950  Weight: 77.1 kg (170 lb)    Examination: General exam: Appears comfortable  HEENT: PERRLA, oral mucosa moist, no sclera icterus or thrush Respiratory system: Clear to auscultation. Respiratory effort normal. Cardiovascular  system: S1 & S2 heard, RRR.  No murmurs  Gastrointestinal system: Abdomen soft, non-tender, nondistended. Normal bowel sound. No organomegaly Central nervous system: Alert and oriented. No focal neurological deficits. Extremities: No cyanosis, clubbing or edema Skin: No rashes or ulcers Psychiatry:  Mood & affect appropriate.     Data Reviewed: I have personally reviewed following labs and imaging studies  CBC:  Recent Labs Lab 09/23/16 1747 09/24/16 0645 09/24/16 1129  WBC 7.5 4.2 5.2  NEUTROABS 5.4  --   --   HGB 8.9* 7.6* 8.2*  HCT 27.3* 23.1* 24.5*  MCV 95.8 95.9 97.2  PLT 101* 73* 82*   Basic Metabolic Panel:  Recent Labs Lab 09/23/16 1747 09/24/16 0645  NA 140 143  K 5.2* 4.0  CL 107 110  CO2 24 26  GLUCOSE 126* 107*  BUN 78* 72*  CREATININE 1.43* 1.14  CALCIUM 8.8* 8.4*  MG  --  2.2  PHOS  --  3.9   GFR: Estimated Creatinine Clearance: 40.5 mL/min (by C-G formula based on SCr of 1.14 mg/dL). Liver Function Tests:  Recent Labs Lab 09/24/16 0645  AST  27  ALT 18  ALKPHOS 121  BILITOT 1.6*  PROT 5.7*  ALBUMIN 3.2*   No results for input(s): LIPASE, AMYLASE in the last 168 hours. No results for input(s): AMMONIA in the last 168 hours. Coagulation Profile:  Recent Labs Lab 09/23/16 1747 09/24/16 0645  INR 2.01 1.78   Cardiac Enzymes: No results for input(s): CKTOTAL, CKMB, CKMBINDEX, TROPONINI in the last 168 hours. BNP (last 3 results) No results for input(s): PROBNP in the last 8760 hours. HbA1C: No results for input(s): HGBA1C in the last 72 hours. CBG: No results for input(s): GLUCAP in the last 168 hours. Lipid Profile: No results for input(s): CHOL, HDL, LDLCALC, TRIG, CHOLHDL, LDLDIRECT in the last 72 hours. Thyroid Function Tests:  Recent Labs  09/24/16 0645  TSH 3.595   Anemia Panel: No results for input(s): VITAMINB12, FOLATE, FERRITIN, TIBC, IRON, RETICCTPCT in the last 72 hours. Urine analysis: No results found for:  COLORURINE, APPEARANCEUR, LABSPEC, PHURINE, GLUCOSEU, HGBUR, BILIRUBINUR, KETONESUR, PROTEINUR, UROBILINOGEN, NITRITE, LEUKOCYTESUR Sepsis Labs: @LABRCNTIP (procalcitonin:4,lacticidven:4) )No results found for this or any previous visit (from the past 240 hour(s)).       Radiology Studies: No results found.    Scheduled Meds: . atorvastatin  80 mg Oral Daily  . metoprolol tartrate  25 mg Oral Daily  . [START ON 09/27/2016] pantoprazole  40 mg Intravenous Q12H  . sodium chloride flush  3 mL Intravenous Q12H   Continuous Infusions: . pantoprozole (PROTONIX) infusion 8 mg/hr (09/24/16 0928)     LOS: 1 day    Time spent in minutes: 67    [place], MD [facility] Pager: [email or web address] Password [email or web address] 09/24/2016, 12:30 PM

## 2016-09-25 ENCOUNTER — Encounter (HOSPITAL_COMMUNITY): Payer: Self-pay

## 2016-09-25 ENCOUNTER — Encounter (HOSPITAL_COMMUNITY)
Admission: EM | Disposition: A | Discharge status: Home / Self Care | Payer: Self-pay | Source: Home / Self Care | Attending: Internal Medicine

## 2016-09-25 DIAGNOSIS — I714 Abdominal aortic aneurysm, without rupture: Secondary | ICD-10-CM

## 2016-09-25 DIAGNOSIS — K922 Gastrointestinal hemorrhage, unspecified: Secondary | ICD-10-CM

## 2016-09-25 DIAGNOSIS — E875 Hyperkalemia: Secondary | ICD-10-CM

## 2016-09-25 DIAGNOSIS — K31819 Angiodysplasia of stomach and duodenum without bleeding: Secondary | ICD-10-CM

## 2016-09-25 HISTORY — PX: ESOPHAGOGASTRODUODENOSCOPY: SHX5428

## 2016-09-25 LAB — BASIC METABOLIC PANEL
ANION GAP: 8 (ref 5–15)
BUN: 60 mg/dL — ABNORMAL HIGH (ref 6–20)
CHLORIDE: 110 mmol/L (ref 101–111)
CO2: 25 mmol/L (ref 22–32)
CREATININE: 1.28 mg/dL — AB (ref 0.61–1.24)
Calcium: 8.5 mg/dL — ABNORMAL LOW (ref 8.9–10.3)
GFR calc non Af Amer: 46 mL/min — ABNORMAL LOW (ref >60)
GFR, EST AFRICAN AMERICAN: 54 mL/min — AB (ref >60)
Glucose, Bld: 115 mg/dL — ABNORMAL HIGH (ref 65–99)
Potassium: 4.1 mmol/L (ref 3.5–5.1)
SODIUM: 143 mmol/L (ref 135–145)

## 2016-09-25 LAB — CBC
HCT: 24.7 % — ABNORMAL LOW (ref 39.0–52.0)
HEMOGLOBIN: 8.1 g/dL — AB (ref 13.0–17.0)
MCH: 32 pg (ref 26.0–34.0)
MCHC: 32.8 g/dL (ref 30.0–36.0)
MCV: 97.6 fL (ref 78.0–100.0)
Platelets: 80 10*3/uL — ABNORMAL LOW (ref 150–400)
RBC: 2.53 MIL/uL — ABNORMAL LOW (ref 4.22–5.81)
RDW: 16.5 % — ABNORMAL HIGH (ref 11.5–15.5)
WBC: 4.9 10*3/uL (ref 4.0–10.5)

## 2016-09-25 LAB — VITAMIN B12: VITAMIN B 12: 584 pg/mL (ref 180–914)

## 2016-09-25 SURGERY — EGD (ESOPHAGOGASTRODUODENOSCOPY)
Anesthesia: Moderate Sedation

## 2016-09-25 MED ORDER — MIDAZOLAM HCL 5 MG/ML IJ SOLN
INTRAMUSCULAR | Status: AC
  Filled 2016-09-25: qty 2

## 2016-09-25 MED ORDER — SODIUM CHLORIDE 0.9 % IV SOLN
INTRAVENOUS | Status: DC
  Administered 2016-09-25: 11:00:00 via INTRAVENOUS

## 2016-09-25 MED ORDER — MIDAZOLAM HCL 10 MG/2ML IJ SOLN
INTRAMUSCULAR | Status: DC | PRN
  Administered 2016-09-25 (×2): 1 mg via INTRAVENOUS

## 2016-09-25 MED ORDER — FENTANYL CITRATE (PF) 100 MCG/2ML IJ SOLN
INTRAMUSCULAR | Status: DC | PRN
  Administered 2016-09-25 (×2): 12.5 ug via INTRAVENOUS

## 2016-09-25 MED ORDER — DIPHENHYDRAMINE HCL 50 MG/ML IJ SOLN
INTRAMUSCULAR | Status: AC
  Filled 2016-09-25: qty 1

## 2016-09-25 MED ORDER — BUTAMBEN-TETRACAINE-BENZOCAINE 2-2-14 % EX AERO
INHALATION_SPRAY | CUTANEOUS | Status: DC | PRN
  Administered 2016-09-25: 1 via TOPICAL

## 2016-09-25 MED ORDER — FENTANYL CITRATE (PF) 100 MCG/2ML IJ SOLN
INTRAMUSCULAR | Status: AC
Start: 2016-09-25 — End: 2016-09-25
  Filled 2016-09-25: qty 2

## 2016-09-25 NOTE — Op Note (Signed)
[facility] Patient Name: Patrick [name] Procedure Date: 09/25/2016 MRN: [id number] Attending MD: Docia Chuck. Henrene Pastor , MD Date of Birth: Aug 26, 1921 CSN: [id number] Age: 83 Admit Type: Inpatient Procedure:                Upper GI endoscopy with control of bleeding Indications:              Melena while on coumadin Providers:                Docia Chuck. Henrene Pastor, MD, Laverta Baltimore RN, RN, Cherylynn Ridges, Technician Referring MD:             [facility]:                Fentanyl 25 micrograms IV, Midazolam 2 mg IV Complications:            No immediate complications. Estimated Blood Loss:     Estimated blood loss: none. Procedure:                Pre-Anesthesia Assessment:                           - Prior to the procedure, a History and Physical                            was performed, and patient medications and                            allergies were reviewed. The patient's tolerance of                            previous anesthesia was also reviewed. The risks                            and benefits of the procedure and the sedation                            options and risks were discussed with the patient.                            All questions were answered, and informed consent                            was obtained. Prior Anticoagulants: The patient has                            taken Coumadin (warfarin), last dose was 3 days                            prior to procedure. ASA Grade Assessment: III - A                            patient with severe systemic disease. After  reviewing the risks and benefits, the patient was                            deemed in satisfactory condition to undergo the                            procedure.                           After obtaining informed consent, the endoscope was                            passed under direct vision. Throughout the                            procedure,  the patient's blood pressure, pulse, and                            oxygen saturations were monitored continuously. The                            [id number] scope was introduced through the                            mouth, and advanced to the second part of duodenum.                            The upper GI endoscopy was accomplished without                            difficulty. The patient tolerated the procedure                            well. Scope In: Scope Out: Findings:      The examined esophagus was normal. Incidental proximal venous blebs       noted.      The entire examined stomach was normal.      A single 3 mm angiodysplastic lesion without bleeding was found in the       duodenal bulb. Coagulation for bleeding prevention using argon beam was       successful. Estimated blood loss: none.      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 Impression:               - Normal esophagus.                           - Normal stomach.                           - A single non-bleeding angiodysplastic lesion in                            the duodenum. Treated with argon beam coagulation.                           -  No specimens collected. Moderate Sedation:      Moderate (conscious) sedation was administered by the endoscopy nurse       and supervised by the endoscopist. The following parameters were       monitored: oxygen saturation, heart rate, blood pressure, and response       to care. Total physician intraservice time was 15 minutes. Recommendation:           1. Regular diet                           2. Resume coumadin.                           3. Take oral iron twice daily                           4. Resume other medications                           5. Have Dr. Joylene Draft monitor you blood counts                           6. For recurrent bleeding consider colonoscopy and                            or  capsule endoscopy with Dr. Fuller Plan                           7. Home tomorrow if doing well after seeing GI                           Discussed with family and provide this report Procedure Code(s):        --- Professional ---                           [phone], Esophagogastroduodenoscopy, flexible,                            transoral; with control of bleeding, any method Diagnosis Code(s):        --- Professional ---                           K31.819, Angiodysplasia of stomach and duodenum                            without bleeding                           K92.1, Melena (includes Hematochezia) CPT copyright 2016 [employer] Association. All rights reserved. The codes documented in this report are preliminary and upon coder review may  be revised to meet current compliance requirements. Docia Chuck. Henrene Pastor, MD 09/25/2016 3:51:16 PM This report has been signed electronically. Number of Addenda: 0

## 2016-09-25 NOTE — Progress Notes (Signed)
PROGRESS NOTE    Patrick [name]   [id number]  DOB: 1920-12-31  DOA: 09/23/2016 PCP: Jerlyn Ly, MD   Brief Narrative:  Patrick [name] is a 83 y.o. male with medical history significant of A.fib on coumadin, AAA , HTN, arthritis, colon polyps found in 2007 hemorrhoids, diverticulosis presented with 2 week history of black stools. No abdominal pian, vomiting or weight loss. No h/o PUD.  Subjective: No complaints. Last black stool was 2 days ago.   Assessment & Plan:   Principal Problem:   Melena - in setting of Coumadin- EGD shows angiodysplastic lesion in duodenum which was treated with argon beam coagulation - no bleeding noted in 2 days-ok to resume Coumadin per GI- cont BID Iron   Active Problems:   Acute Blood loss anemia and Acute thrombocytopenia - transfused 1 u PRBC for Hb of 7.6- now Hb 8.1 - follow platelets    Atrial fibrillation   - cont Metoprolol- hold Coumadin     AAA (abdominal aortic aneurysm)     HTN (hypertension) - cont Metoprolol - hold Lisinopril    Hyperkalemia - K 5.2 on admission- has resolved  DVT prophylaxis: SCD Code Status: Full code Family Communication:  Disposition Plan: home when stable Consultants:   GI Procedures:    Antimicrobials:  Anti-infectives    None       Objective: Vitals:   09/25/16 1520 09/25/16 1525 09/25/16 1530 09/25/16 1555  BP: (!) 73/33 90/72 (!) 95/45   Pulse:    60  Resp:      Temp:   97.8 F (36.6 C)   TempSrc:   Oral   SpO2:    91%  Weight:      Height:        Intake/Output Summary (Last 24 hours) at 09/25/16 1559 Last data filed at 09/25/16 1500  Gross per 24 hour  Intake          1341.75 ml  Output                0 ml  Net          1341.75 ml   Filed Weights   09/23/16 1950  Weight: 77.1 kg (170 lb)    Examination: General exam: Appears comfortable  HEENT: PERRLA, oral mucosa moist, no sclera icterus or thrush Respiratory system: Clear to auscultation. Respiratory effort  normal. Cardiovascular system: S1 & S2 heard, RRR.  No murmurs  Gastrointestinal system: Abdomen soft, non-tender, nondistended. Normal bowel sound. No organomegaly Central nervous system: Alert and oriented. No focal neurological deficits. Extremities: No cyanosis, clubbing or edema Skin: No rashes or ulcers Psychiatry:  Mood & affect appropriate.     Data Reviewed: I have personally reviewed following labs and imaging studies  CBC:  Recent Labs Lab 09/23/16 1747 09/24/16 0645 09/24/16 1129 09/24/16 1827 09/25/16 0535  WBC 7.5 4.2 5.2 5.5 4.9  NEUTROABS 5.4  --   --   --   --   HGB 8.9* 7.6* 8.2* 8.2* 8.1*  HCT 27.3* 23.1* 24.5* 24.7* 24.7*  MCV 95.8 95.9 97.2 98.0 97.6  PLT 101* 73* 82* 87* 80*   Basic Metabolic Panel:  Recent Labs Lab 09/23/16 1747 09/24/16 0645 09/25/16 0535  NA 140 143 143  K 5.2* 4.0 4.1  CL 107 110 110  CO2 24 26 25   GLUCOSE 126* 107* 115*  BUN 78* 72* 60*  CREATININE 1.43* 1.14 1.28*  CALCIUM 8.8* 8.4* 8.5*  MG  --  2.2  --  PHOS  --  3.9  --    GFR: Estimated Creatinine Clearance: 36.1 mL/min (by C-G formula based on SCr of 1.28 mg/dL (H)). Liver Function Tests:  Recent Labs Lab 09/24/16 0645  AST 27  ALT 18  ALKPHOS 121  BILITOT 1.6*  PROT 5.7*  ALBUMIN 3.2*   No results for input(s): LIPASE, AMYLASE in the last 168 hours. No results for input(s): AMMONIA in the last 168 hours. Coagulation Profile:  Recent Labs Lab 09/23/16 1747 09/24/16 0645  INR 2.01 1.78   Cardiac Enzymes: No results for input(s): CKTOTAL, CKMB, CKMBINDEX, TROPONINI in the last 168 hours. BNP (last 3 results) No results for input(s): PROBNP in the last 8760 hours. HbA1C: No results for input(s): HGBA1C in the last 72 hours. CBG: No results for input(s): GLUCAP in the last 168 hours. Lipid Profile: No results for input(s): CHOL, HDL, LDLCALC, TRIG, CHOLHDL, LDLDIRECT in the last 72 hours. Thyroid Function Tests:  Recent Labs   09/24/16 0645  TSH 3.595   Anemia Panel:  Recent Labs  09/24/16 1305 09/25/16 0535  VITAMINB12  --  584  FOLATE 20.2  --   FERRITIN 43  --   TIBC 389  --   IRON 34*  --   RETICCTPCT 3.8*  --    Urine analysis: No results found for: COLORURINE, APPEARANCEUR, LABSPEC, PHURINE, GLUCOSEU, HGBUR, BILIRUBINUR, KETONESUR, PROTEINUR, UROBILINOGEN, NITRITE, LEUKOCYTESUR Sepsis Labs: @LABRCNTIP (procalcitonin:4,lacticidven:4) )No results found for this or any previous visit (from the past 240 hour(s)).       Radiology Studies: No results found.    Scheduled Meds: . [MAR Hold] atorvastatin  80 mg Oral Daily  . [MAR Hold] metoprolol tartrate  25 mg Oral Daily  . [MAR Hold] pantoprazole  40 mg Intravenous Q12H  . [MAR Hold] sodium chloride flush  3 mL Intravenous Q12H   Continuous Infusions: . sodium chloride 20 mL/hr at 09/25/16 1051  . pantoprozole (PROTONIX) infusion 8 mg/hr (09/25/16 0652)     LOS: 2 days    Time spent in minutes: 86    [place], MD [facility] Pager: [email or web address] Password [email or web address] 09/25/2016, 3:59 PM

## 2016-09-25 NOTE — Interval H&P Note (Signed)
History and Physical Interval Note:  09/25/2016 2:35 PM  Patrick [name]  has presented today for surgery, with the diagnosis of Black stools. GI bleed. Anemia.  The various methods of treatment have been discussed with the patient and family. After consideration of risks, benefits and other options for treatment, the patient has consented to  Procedure(s): ESOPHAGOGASTRODUODENOSCOPY (EGD) (N/A) as a surgical intervention .  The patient's history has been reviewed, patient examined, no change in status, stable for surgery.  I have reviewed the patient's chart and labs.  Questions were answered to the patient's satisfaction.     Scarlette Shorts

## 2016-09-25 NOTE — H&P (View-Only) (Signed)
[facility] Consult: 1:46 PM 09/24/2016  LOS: 1 day    Referring Provider: Dr Wynelle Cleveland  Primary Care Physician:  Jerlyn Ly, MD Primary Gastroenterologist:  Dr. Fuller Plan    Reason for Consultation:  Black stools and acute anemia.   HPI: Patrick [name] is a 83 y.o. male.  PMH atrial fibrillation, on Coumadin. 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 Hypertension. Arthritis. Colon polyps.  Thrombocytopenia dating back to 2008. History of adenomatous colon polyps dating back to 2004. Had recurrent polyps in 2007. Most recent colonoscopy 2010: Dr. Lucio Edward. 2 follow-up 2007 history of tubulovillous adenoma. Several sessile polyps encountered. Size 2-5 mm, locations in cecum and transverse, hepatic flexure, rectum. No records of previous upper endoscopy. 05/16/2016 ultrasound showed minimal ascites but no other abnormalities.  On PCP office visit yesterday, 09/23/16 he gave a history of black stools for 2 weeks and felt a "tad dizzy".  No syncope or falls.  Appetite and weight have been stable. No nausea or vomiting. No indigestion. No dysphagia. He doesn't recall having undergone previous upper endoscopy. Not aware of any previous diagnosis of ulcer disease.  Meds include the Coumadin but no aspirin.  A few days ago he took an Advil, which she doesn't normally do very often. But the black stools well predated his use of that Advil. He doesn't take a PPI or H2 blocker as he has no symptoms which would necessitate these.  This based hgb 8.8 and he was sent to the emergency room. He has been admitted, Protonix drip initiated. Received 5mg  PO vitamin K.  No transfusions to date.   Hgb 7.6 >> 8.2 this morning.  Platelets 73 >> 82. Coags 23.1/2.0 >> 20/1.7.     Past Medical History:  Diagnosis Date  . AAA (abdominal  aortic aneurysm) ([place])   . Adenomatous colon polyp 2004   TVA 04/2006  . Arthritis   . Atrial fibrillation ([place])   . Diverticulosis   . Hemorrhoids   . Hypercholesterolemia   . Hypertension     Past Surgical History:  Procedure Laterality Date  . HERNIA REPAIR  Age 32  . TONSILLECTOMY     Very Young    Prior to Admission medications   Medication Sig Start Date End Date Taking? Authorizing Provider  atorvastatin (LIPITOR) 80 MG tablet Take 80 mg by mouth daily.     Yes Historical Provider, MD  furosemide (LASIX) 20 MG tablet Take 2 tablets (40 mg total) by mouth 3 (three) times a week. Take on Mon, Wed, Fri Patient taking differently: Take 40 mg by mouth every Monday, Wednesday, and Friday. Take on Mon, Wed, Fri 10/06/15  Yes Thayer Headings, MD  lisinopril (PRINIVIL,ZESTRIL) 5 MG tablet Take 5 mg by mouth daily.     Yes Historical Provider, MD  metoprolol tartrate (LOPRESSOR) 25 MG tablet Take 25 mg by mouth daily. 03/04/16  Yes Historical Provider, MD  neomycin-polymyxin b-dexamethasone (MAXITROL) 3.5-10000-0.1 SUSP Place 1 drop into both eyes 4 (four) times daily. Started 1/22 x 5 days 09/19/16  Yes Historical  Provider, MD  Omega-3 Fatty Acids (FISH OIL) 1200 MG CAPS Take 1 capsule by mouth daily.    Yes Historical Provider, MD  Polyethyl Glycol-Propyl Glycol (SYSTANE OP) Apply 1 drop to eye 2 (two) times daily as needed (dry eyes).    Yes Historical Provider, MD  Polyethylene Glycol 3350 (MIRALAX PO) Take by mouth 2 (two) times a week.     Yes Historical Provider, MD  potassium chloride (K-DUR) 10 MEQ tablet Take 2 tablets (20 mEq total) by mouth 3 (three) times a week. Take on Mon, Wed, Fri 10/06/15  Yes Thayer Headings, MD  warfarin (COUMADIN) 2 MG tablet Take 2 mg by mouth daily. Take 2mg  along with the 5mg  tablet every day of the week, except for Tuesdays. Only take 5mg  on Tuesdays.   Yes Historical Provider, MD  warfarin (COUMADIN) 5 MG tablet Take 5 mg by mouth daily. Take 5mg  by  mouth along with the 2mg  tablet every day of the week, except for Tuesdays. Take only 5mg  by mouth on tuesdays.   Yes Historical Provider, MD    Scheduled Meds: . atorvastatin  80 mg Oral Daily  . metoprolol tartrate  25 mg Oral Daily  . [START ON 09/27/2016] pantoprazole  40 mg Intravenous Q12H  . sodium chloride flush  3 mL Intravenous Q12H   Infusions: . pantoprozole (PROTONIX) infusion 8 mg/hr (09/24/16 0928)   PRN Meds: acetaminophen **OR** acetaminophen, HYDROcodone-acetaminophen, ondansetron **OR** ondansetron (ZOFRAN) IV   Allergies as of 09/23/2016  . (No Known Allergies)    Family History  Problem Relation Age of Onset  . Emphysema Father   . Diabetes Neg Hx   . Cancer Neg Hx     Social History   Social History  . Marital status: Divorced    Spouse name: N/A  . Number of children: 3  . Years of education: N/A   Occupational History  . retired Retired   Social History Main Topics  . Smoking status: Former Smoker    Types: Cigarettes    Quit date: 08/29/1969  . Smokeless tobacco: Never Used  . Alcohol use Yes     Comment: glass of wine 1-2 times per week  . Drug use: No  . Sexual activity: Not on file   Other Topics Concern  . Not on file   Social History Narrative  . No narrative on file    REVIEW OF SYSTEMS: Constitutional:  Generally patient is strong. He reports no weakness. ENT:  No nose bleeds Pulm:  Stable dyspnea on exertion. More than a few steps and walking more than about 20 feet cause shortness of breath. No productive cough. CV:  No studies having palpitations.  Pedal edema controlled so long as he takes his diuretics. No chest pain  GU:  No hematuria, no frequency GI:  Per HPI Heme:  No unusual bleeding or bruising.   Transfusions:  Never received blood transfusions. Neuro:  No headaches, no peripheral tingling or numbness Derm:  No itching, no rash or sores.  Endocrine:  No sweats or chills.  No polyuria or dysuria Immunization:   Didn't get a flu shot this year. Travel:  None beyond local counties in last few months.    PHYSICAL EXAM: Vital signs in last 24 hours: Vitals:   09/24/16 0640 09/24/16 0819  BP: 112/68 115/63  Pulse: 62 66  Resp: 20 19  Temp: 98.3 F (36.8 C) 98.3 F (36.8 C)   Wt Readings from Last 3 Encounters:  09/23/16  77.1 kg (170 lb)  09/02/16 75.8 kg (167 lb)  08/10/16 75.8 kg (167 lb)    General: Pleasant, looks well. Looks 23 years younger than stated age. Head:  No signs of head trauma. No facial asymmetry or swelling.  Eyes:  No scleral icterus, no conjunctival pallor. Ears:  Slight hearing deficit.  Nose:  No congestion or discharge Mouth:  Moist, clear oral mucosa. No blood. Tongue is midline. Neck:  No JVD, masses or thyromegaly. Lungs:  There are bilaterally. No cough or labored breathing Heart: Irregularly irregular but rate is controlled. No MRG. S1, S2 present. Abdomen:  Soft. No organomegaly, tenderness, bruits, hernias. Nondistended. Active bowel sounds..   Rectal: Deferred rectal exam.   Musc/Skeltl: No joint swelling, erythema or gross deformities. Some arthritic changes in the fingers/hands. Extremities:  No CCE.  Neurologic:  Alert. Oriented times 3. No tremor. I'm weakness.  It historian Skin:  No telangiectasia no suspicious lesions. No sores or rashes. Tattoos:  None observed. Nodes:  Cervical adenopathy.   Psych:  Present. Fully alert and engaged. Calm.  Intake/Output from previous day: 01/26 0701 - 01/27 0700 In: 240 [P.O.:240] Out: -  Intake/Output this shift: No intake/output data recorded.  LAB RESULTS:  Recent Labs  09/23/16 1747 09/24/16 0645 09/24/16 1129  WBC 7.5 4.2 5.2  HGB 8.9* 7.6* 8.2*  HCT 27.3* 23.1* 24.5*  PLT 101* 73* 82*   BMET Lab Results  Component Value Date   NA 143 09/24/2016   NA 140 09/23/2016   NA 141 11/03/2015   K 4.0 09/24/2016   K 5.2 (H) 09/23/2016   K 4.4 11/03/2015   CL 110 09/24/2016   CL 107  09/23/2016   CL 105 11/03/2015   CO2 26 09/24/2016   CO2 24 09/23/2016   CO2 24 11/03/2015   GLUCOSE 107 (H) 09/24/2016   GLUCOSE 126 (H) 09/23/2016   GLUCOSE 119 (H) 11/03/2015   BUN 72 (H) 09/24/2016   BUN 78 (H) 09/23/2016   BUN 29 (H) 11/03/2015   CREATININE 1.14 09/24/2016   CREATININE 1.43 (H) 09/23/2016   CREATININE 1.27 (H) 11/03/2015   CALCIUM 8.4 (L) 09/24/2016   CALCIUM 8.8 (L) 09/23/2016   CALCIUM 8.4 (L) 11/03/2015   LFT  Recent Labs  09/24/16 0645  PROT 5.7*  ALBUMIN 3.2*  AST 27  ALT 18  ALKPHOS 121  BILITOT 1.6*   PT/INR Lab Results  Component Value Date   INR 1.78 09/24/2016   INR 2.01 09/23/2016   INR 2.30 (H) 07/01/2015   Hepatitis Panel No results for input(s): HEPBSAG, HCVAB, HEPAIGM, HEPBIGM in the last 72 hours. C-Diff No components found for: CDIFF Lipase  No results found for: LIPASE  Drugs of Abuse  No results found for: LABOPIA, COCAINSCRNUR, LABBENZ, AMPHETMU, THCU, LABBARB   RADIOLOGY STUDIES: No results found.   IMPRESSION:   *  Upper GI bleed. Rule out ulcer disease, rule out AVMs, rule out neoplasia.  *  Blood loss anemia, acute  *  History of atrial fibrillation. His chronic Coumadin is now on hold. INR is subtherapeutic following oral vitamin K.  *  History adenomatous colon polyps 2004, 2007, 2010  *  On standing thrombocytopenia.  Splenomegaly not documented on ultrasound in 9/201 7.    PLAN:     *  Try to aim for upper endoscopy tomorrow if schedule will allow.  He can continue his diet today. We may have to defer the endoscopy until 1/29 due to scheduling conflicts.  Continue the IV Protonix  Azucena Freed  09/24/2016, 1:46 PM Pager: [phone]  GI ATTENDING  History, laboratories, x-rays, prior endoscopy reports reviewed. Patient personally seen and examined. Wife in room. Patient presents with acute upper GI bleed. On chronic Coumadin. INR is correcting. Stable. Continue PPI. EGD likely tomorrow. The  patient is high-risk given his age and comorbidities.The nature of the procedure, as well as the risks, benefits, and alternatives were carefully and thoroughly reviewed with the patient. Ample time for discussion and questions allowed. The patient understood, was satisfied, and agreed to proceed.  Docia Chuck. Geri Seminole., M.D. [facility] Division of Gastroenterology.

## 2016-09-26 ENCOUNTER — Encounter (HOSPITAL_COMMUNITY): Payer: Self-pay | Admitting: Internal Medicine

## 2016-09-26 LAB — CBC
HCT: 25.4 % — ABNORMAL LOW (ref 39.0–52.0)
Hemoglobin: 8.1 g/dL — ABNORMAL LOW (ref 13.0–17.0)
MCH: 30.8 pg (ref 26.0–34.0)
MCHC: 31.9 g/dL (ref 30.0–36.0)
MCV: 96.6 fL (ref 78.0–100.0)
PLATELETS: 82 10*3/uL — AB (ref 150–400)
RBC: 2.63 MIL/uL — AB (ref 4.22–5.81)
RDW: 16.2 % — ABNORMAL HIGH (ref 11.5–15.5)
WBC: 5.1 10*3/uL (ref 4.0–10.5)

## 2016-09-26 LAB — BASIC METABOLIC PANEL
Anion gap: 9 (ref 5–15)
BUN: 47 mg/dL — ABNORMAL HIGH (ref 6–20)
CALCIUM: 8.6 mg/dL — AB (ref 8.9–10.3)
CHLORIDE: 111 mmol/L (ref 101–111)
CO2: 25 mmol/L (ref 22–32)
CREATININE: 1.35 mg/dL — AB (ref 0.61–1.24)
GFR calc non Af Amer: 44 mL/min — ABNORMAL LOW (ref >60)
GFR, EST AFRICAN AMERICAN: 50 mL/min — AB (ref >60)
Glucose, Bld: 119 mg/dL — ABNORMAL HIGH (ref 65–99)
Potassium: 4 mmol/L (ref 3.5–5.1)
SODIUM: 145 mmol/L (ref 135–145)

## 2016-09-26 MED ORDER — FERROUS SULFATE 325 (65 FE) MG PO TABS: 325.0000 mg | ORAL_TABLET | Freq: Three times a day (TID) | ORAL | 0 refills | Status: AC

## 2016-09-26 MED ORDER — POLYETHYLENE GLYCOL 3350 17 G PO PACK: 17.0000 g | PACK | Freq: Every day | ORAL | 0 refills | Status: DC | PRN

## 2016-09-26 NOTE — Discharge Instructions (Signed)
Due to elevated kidney function on your blood work, do not take Potassium, Lasix or Lisinopril- ask your PCP to check a Bmet in 2-3 days and resume medications if stable.   Please take all your medications with you for your next visit with your Primary MD. Please request your Primary MD to go over all hospital test results at the follow up. Please ask your Primary MD to get all Hospital records sent to his/her office.  If you experience worsening of your admission symptoms, develop shortness of breath, chest pain, suicidal or homicidal thoughts or a life threatening emergency, you must seek medical attention immediately by calling 911 or calling your MD.  Dennis Bast must read the complete instructions/literature along with all the possible adverse reactions/side effects for all the medicines you take including new medications that have been prescribed to you. Take new medicines after you have completely understood and accpet all the possible adverse reactions/side effects.   Do not drive when taking pain medications or sedatives.    Do not take more than prescribed Pain, Sleep and Anxiety Medications  If you have smoked or chewed Tobacco in the last 2 yrs please stop. Stop any regular alcohol and or recreational drug use.  Wear Seat belts while driving.

## 2016-09-26 NOTE — Discharge Summary (Signed)
Physician Discharge Summary  HOGAN FUNARO [id number] DOB: 11/05/1920 DOA: 09/23/2016  PCP: Jerlyn Ly, MD  Admit date: 09/23/2016 Discharge date: 09/26/2016  Admitted From: home Disposition:  home   Recommendations for Outpatient Follow-up:  1. Cbc in 2-3 days- f/u on Hb and platelets- resume Coumadin if he is stable- have discussed with patient in detail 2. Bmet in 2-3 days- f/u K+ and Cr prior to resuming Lisinopril, Lasix and K+  Home Health:  none  Equipment/Devices:  none    Discharge Condition:  stable   CODE STATUS:  Full code   Diet recommendation:  Heart heatlhy Consultations:  GI    Discharge Diagnoses:  Principal Problem:   Melena/ Angiodysplasia of duodenum Active Problems:   Atrial fibrillation (HCC)   AAA (abdominal aortic aneurysm) (H[facility])   HTN (hypertension)   Blood loss anemia   Hyperkalemia    Subjective: No complaints today.   Brief Summary: Seth Bake a 83 y.o.malewith medical history significant of A.fib on coumadin, AAA , HTN, arthritis, colon polyps found in 2007 hemorrhoids, diverticulosis presented with 2week history of black stools. No abdominal pian, vomiting or weight loss. No h/o PUD.  Hospital Course:  Principal Problem:   Melena - in setting of Coumadin  use - EGD shows angiodysplastic lesion in duodenum which was treated with argon beam coagulation - no bleeding noted since admission -ok to resume Coumadin per GI- due to continued thrombocytopenia, I am hesitant to resume Coumadin today and would like him to visit his PCP later this week and have a repeat CBC - start Ferrous Sulfate-   Active Problems:   Acute Blood loss anemia and Acute thrombocytopenia - transfused 1 u PRBC for Hb of 7.6- now Hb 8.1 - follow platelets as outpt- I do not know his baseline    Atrial fibrillation   - cont Metoprolol- hold Coumadin   AKI vs CKD 2 - cont to hold Lisinopril, Lasix and K+-    - has good urine output- - need Bmet in  2-3 days to help decide on resumption of above meds     HTN (hypertension) - cont Metoprolol - holding Lisinopril due to hyperkalemia and mild elevation in Cr- will need Bmet later this week prior to resuming    Hyperkalemia - K 5.2 on admission- holding K supplements and ACE I- has resolved    AAA (abdominal aortic aneurysm)   Discharge Instructions  Discharge Instructions    Diet - low sodium heart healthy    Complete by:  As directed    Increase activity slowly    Complete by:  As directed      Allergies as of 09/26/2016   No Known Allergies     Medication List    STOP taking these medications   furosemide 20 MG tablet Commonly known as:  LASIX   lisinopril 5 MG tablet Commonly known as:  PRINIVIL,ZESTRIL   potassium chloride 10 MEQ tablet Commonly known as:  K-DUR   warfarin 2 MG tablet Commonly known as:  COUMADIN   warfarin 5 MG tablet Commonly known as:  COUMADIN     TAKE these medications   atorvastatin 80 MG tablet Commonly known as:  LIPITOR Take 80 mg by mouth daily.   Fish Oil 1200 MG Caps Take 1 capsule by mouth daily.   metoprolol tartrate 25 MG tablet Commonly known as:  LOPRESSOR Take 25 mg by mouth daily.   MIRALAX PO Take by mouth 2 (two) times a week.  neomycin-polymyxin b-dexamethasone 3.5-10000-0.1 Susp Commonly known as:  MAXITROL Place 1 drop into both eyes 4 (four) times daily. Started 1/22 x 5 days   SYSTANE OP Apply 1 drop to eye 2 (two) times daily as needed (dry eyes).      Follow-up Information    PERINI,MARK A, MD Follow up.   Specialty:  Internal Medicine Why:  needs CBC in 2 days Contact information: [address] [place] [address] [phone]          No Known Allergies   Procedures/Studies: EGD 1/28  Impression:               - Normal esophagus.                           - Normal stomach.                           - A single non-bleeding angiodysplastic lesion in                             the duodenum. Treated with argon beam coagulation.                           - No specimens collected.  No results found.     Discharge Exam: Vitals:   09/26/16 0603 09/26/16 0818  BP: 110/61 113/64  Pulse: (!) 59 60  Resp: 18 18  Temp: 98.7 F (37.1 C) 97.7 F (36.5 C)   Vitals:   09/25/16 1949 09/26/16 0012 09/26/16 0603 09/26/16 0818  BP: 125/66 128/63 110/61 113/64  Pulse: 88 60 (!) 59 60  Resp: 18 18 18 18   Temp: 98.3 F (36.8 C) 98.9 F (37.2 C) 98.7 F (37.1 C) 97.7 F (36.5 C)  TempSrc: Oral Oral Oral Oral  SpO2: 100% 100% 95% 97%  Weight:      Height:        General: Pt is alert, awake, not in acute distress Cardiovascular: RRR, S1/S2 +, no rubs, no gallops Respiratory: CTA bilaterally, no wheezing, no rhonchi Abdominal: Soft, NT, ND, bowel sounds + Extremities: no edema, no cyanosis    The results of significant diagnostics from this hospitalization (including imaging, microbiology, ancillary and laboratory) are listed below for reference.     Microbiology: No results found for this or any previous visit (from the past 240 hour(s)).   Labs: BNP (last 3 results) No results for input(s): BNP in the last 8760 hours. Basic Metabolic Panel:  Recent Labs Lab 09/23/16 1747 09/24/16 0645 09/25/16 0535 09/26/16 0524  NA 140 143 143 145  K 5.2* 4.0 4.1 4.0  CL 107 110 110 111  CO2 24 26 25 25   GLUCOSE 126* 107* 115* 119*  BUN 78* 72* 60* 47*  CREATININE 1.43* 1.14 1.28* 1.35*  CALCIUM 8.8* 8.4* 8.5* 8.6*  MG  --  2.2  --   --   PHOS  --  3.9  --   --    Liver Function Tests:  Recent Labs Lab 09/24/16 0645  AST 27  ALT 18  ALKPHOS 121  BILITOT 1.6*  PROT 5.7*  ALBUMIN 3.2*   No results for input(s): LIPASE, AMYLASE in the last 168 hours. No results for input(s): AMMONIA in the last 168 hours. CBC:  Recent Labs Lab 09/23/16 1747 09/24/16 0645  09/24/16 1129 09/24/16 1827 09/25/16 0535 09/26/16 0524  WBC 7.5 4.2 5.2 5.5 4.9  5.1  NEUTROABS 5.4  --   --   --   --   --   HGB 8.9* 7.6* 8.2* 8.2* 8.1* 8.1*  HCT 27.3* 23.1* 24.5* 24.7* 24.7* 25.4*  MCV 95.8 95.9 97.2 98.0 97.6 96.6  PLT 101* 73* 82* 87* 80* 82*   Cardiac Enzymes: No results for input(s): CKTOTAL, CKMB, CKMBINDEX, TROPONINI in the last 168 hours. BNP: Invalid input(s): POCBNP CBG: No results for input(s): GLUCAP in the last 168 hours. D-Dimer No results for input(s): DDIMER in the last 72 hours. Hgb A1c No results for input(s): HGBA1C in the last 72 hours. Lipid Profile No results for input(s): CHOL, HDL, LDLCALC, TRIG, CHOLHDL, LDLDIRECT in the last 72 hours. Thyroid function studies  Recent Labs  09/24/16 0645  TSH 3.595   Anemia work up  Recent Labs  09/24/16 1305 09/25/16 0535  VITAMINB12  --  584  FOLATE 20.2  --   FERRITIN 43  --   TIBC 389  --   IRON 34*  --   RETICCTPCT 3.8*  --    Urinalysis No results found for: COLORURINE, APPEARANCEUR, LABSPEC, PHURINE, GLUCOSEU, HGBUR, BILIRUBINUR, KETONESUR, PROTEINUR, UROBILINOGEN, NITRITE, LEUKOCYTESUR Sepsis Labs Invalid input(s): PROCALCITONIN,  WBC,  LACTICIDVEN Microbiology No results found for this or any previous visit (from the past 240 hour(s)).   Time coordinating discharge: Over 30 minutes  SIGNED:   Debbe Odea, MD  [facility] 09/26/2016, 10:52 AM Pager   If 7PM-7AM, please contact night-coverage [email or web address] Password [email or web address]

## 2016-09-26 NOTE — Progress Notes (Signed)
Progress Note   Subjective  Chief Complaint:  Melena  Patient was standing up and dressed, ready to go home when I walked in the room today. He tells me he has not had a bowel movement for 5 days. He did eat quite a large dinner and breakfast this morning. He denies any abdominal pain and is feeling well.    Objective   Vital signs in last 24 hours: Temp:  [97.7 F (36.5 C)-98.9 F (37.2 C)] 97.7 F (36.5 C) (01/29 0818) Pulse Rate:  [54-88] 60 (01/29 0818) Resp:  [17-20] 18 (01/29 0818) BP: (73-129)/(31-72) 113/64 (01/29 0818) SpO2:  [91 %-100 %] 97 % (01/29 0818) Last BM Date: 09/22/16 General:Caucasian male in NAD Heart:  Regular rate and rhythm; no murmurs Lungs: Respirations even and unlabored, lungs CTA bilaterally Abdomen:  Soft, nontender and nondistended. Normal bowel sounds. Extremities:  Without edema. Neurologic:  Alert and oriented,  grossly normal neurologically. Psych:  Cooperative. Normal mood and affect.  Intake/Output from previous day: 01/28 0701 - 01/29 0700 In: 973 [P.O.:100; I.V.:873] Out: -  Intake/Output this shift: Total I/O In: 360 [P.O.:360] Out: -   Lab Results:  Recent Labs  09/24/16 1827 09/25/16 0535 09/26/16 0524  WBC 5.5 4.9 5.1  HGB 8.2* 8.1* 8.1*  HCT 24.7* 24.7* 25.4*  PLT 87* 80* 82*   BMET  Recent Labs  09/24/16 0645 09/25/16 0535 09/26/16 0524  NA 143 143 145  K 4.0 4.1 4.0  CL 110 110 111  CO2 26 25 25   GLUCOSE 107* 115* 119*  BUN 72* 60* 47*  CREATININE 1.14 1.28* 1.35*  CALCIUM 8.4* 8.5* 8.6*   LFT  Recent Labs  09/24/16 0645  PROT 5.7*  ALBUMIN 3.2*  AST 27  ALT 18  ALKPHOS 121  BILITOT 1.6*   PT/INR  Recent Labs  09/23/16 1747 09/24/16 0645  LABPROT 23.1* 20.9*  INR 2.01 1.78    Studies/Results: EGD 09/25/16: Findings:      The examined esophagus was normal. Incidental proximal venous blebs       noted.      The entire examined stomach was normal.      A single 3 mm  angiodysplastic lesion without bleeding was found in the       duodenal bulb. Coagulation for bleeding prevention using argon beam was       successful. Estimated blood loss: none.      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 Impression:       - Normal esophagus.                           - Normal stomach.                           - A single non-bleeding angiodysplastic lesion in                            the duodenum. Treated with argon beam coagulation.                           - No specimens collected.  Recommendation:                1. Regular diet  2. Resume coumadin.                           3. Take oral iron twice daily                           4. Resume other medications                           5. Have Dr. Joylene Draft monitor you blood counts                           6. For recurrent bleeding consider colonoscopy and                            or capsule endoscopy with Dr. Fuller Plan                           7. Home tomorrow if doing well after seeing GI                           Discussed with family and provide this report   Assessment / Plan:   Assessment: 1. Upper GI bleed: Thought due to angiodysplastic lesion seen in the duodenum at time of EGD as above 2. Acute blood loss anemia: Hemoglobin stable at 8.1 3. History of A. Fib: See above, Dr. Henrene Pastor instructed patient to restart his Coumadin  Plan: 1. Okay with patient being discharged home 2.  Will send note for our clinic to call and check up on patient next week, no need for follow up in clinic necessarily, as long as he is doing well 3. Discussed above with Dr. Fuller Plan  Thank you for your kind consultation, we will follow along as long as patient is in hospital.    LOS: 3 days   Levin Erp  09/26/2016, 11:36 AM  Pager # [phone]

## 2016-10-03 ENCOUNTER — Telehealth: Payer: Self-pay

## 2016-10-03 ENCOUNTER — Other Ambulatory Visit: Payer: Self-pay

## 2016-10-03 DIAGNOSIS — K921 Melena: Secondary | ICD-10-CM

## 2016-10-03 NOTE — Telephone Encounter (Signed)
Can you please have him get a repeat CBC at his earliest convenience. Thank you-JLL

## 2016-10-03 NOTE — Telephone Encounter (Signed)
Left message on machine to call back lab order in EPIC 

## 2016-10-03 NOTE — Telephone Encounter (Signed)
-----   Message from Barron Alvine, RN sent at 09/26/2016 12:39 PM EST ----- Can you make a note to call this pt next week and see how he is doing after recent EGD for melena. He is being discharged today. He doesn't necessarily need a follow up appt as long as he is feeling well. Thanks-JLL

## 2016-10-03 NOTE — Telephone Encounter (Signed)
FYI:  The pt states he is feeling fine, no complaints at this time.  He will call if he develops any symptoms or complaints.

## 2016-10-04 NOTE — Telephone Encounter (Signed)
The pt has been notified and will have labs.  He also states he will see PCP Dr Joylene Draft and may have labs at his office and will have those faxed to our office.

## 2016-11-10 ENCOUNTER — Encounter: Payer: Self-pay | Admitting: Cardiovascular Disease

## 2016-11-18 ENCOUNTER — Encounter: Payer: Self-pay | Admitting: Podiatry

## 2016-11-18 ENCOUNTER — Ambulatory Visit (INDEPENDENT_AMBULATORY_CARE_PROVIDER_SITE_OTHER): Payer: Medicare Other | Admitting: Podiatry

## 2016-11-18 DIAGNOSIS — M79676 Pain in unspecified toe(s): Secondary | ICD-10-CM | POA: Diagnosis not present

## 2016-11-18 DIAGNOSIS — B351 Tinea unguium: Secondary | ICD-10-CM

## 2016-11-18 NOTE — Progress Notes (Signed)
Patient ID: Patrick [name], male   DOB: 11/26/1920, 83 y.o.   MRN: [id number] Complaint:  Visit Type: Patient returns to my office for continued preventative foot care services. Complaint: Patient states" my nails have grown long and thick and become painful to walk and wear shoes" . The patient presents for preventative foot care services. No changes to ROS  Podiatric Exam: Vascular: dorsalis pedis and posterior tibial pulses are not  Palpable due to his unna boots  bilateral. Capillary return is immediate. Temperature gradient is WNL. Skin turgor WNL .  Purplish discoloration both feet. Sensorium: Normal Semmes Weinstein monofilament test. Normal tactile sensation bilaterally. Nail Exam: Pt has thick disfigured discolored nails with subungual debris noted bilateral entire nail hallux through fifth toenails Ulcer Exam: There is no evidence of ulcer or pre-ulcerative changes or infection. Orthopedic Exam: Muscle tone and strength are WNL. No limitations in general ROM. No crepitus or effusions noted. Foot type and digits show no abnormalities. Bony prominences are unremarkable. Skin: No Porokeratosis. No infection or ulcers  Diagnosis:  Onychomycosis, , Pain in right toe, pain in left toes  Treatment & Plan Procedures and Treatment: Consent by patient was obtained for treatment procedures. The patient understood the discussion of treatment and procedures well. All questions were answered thoroughly reviewed. Debridement of mycotic and hypertrophic toenails, 1 through 5 bilateral and clearing of subungual debris. No ulceration, no infection noted.  Return Visit-Office Procedure: Patient instructed to return to the office for a follow up visit prn for continued evaluation and treatment.   Gardiner Barefoot DPM

## 2016-11-22 ENCOUNTER — Ambulatory Visit (INDEPENDENT_AMBULATORY_CARE_PROVIDER_SITE_OTHER): Payer: Medicare Other | Admitting: Cardiovascular Disease

## 2016-11-22 ENCOUNTER — Encounter: Payer: Self-pay | Admitting: Cardiovascular Disease

## 2016-11-22 VITALS — BP 100/70 | HR 58 | Ht 69.0 in | Wt 166.4 lb

## 2016-11-22 DIAGNOSIS — I482 Chronic atrial fibrillation, unspecified: Secondary | ICD-10-CM

## 2016-11-22 DIAGNOSIS — I10 Essential (primary) hypertension: Secondary | ICD-10-CM | POA: Diagnosis not present

## 2016-11-22 NOTE — Progress Notes (Signed)
Cardiology Office Note   Date:  11/22/2016   ID:  Patrick [name], DOB 07/01/1921, MRN [id number]  PCP:  Jerlyn Ly, MD  Cardiologist:   Mertie Moores, MD   No chief complaint on file.  1. Atrial Fibrillation 2. Hyperlipidemia 3. Hypertension  Previous Notes:  83 yo gentleman with a hx of atrial fibrillation. It's his birthday today. He is still fairly active. 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 He worked at [employer] garden this past summer.   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  Patrick [name] presents today for his one-year visit. He's not had any problems. He's on chronic Coumadin for his atrial fibrillation. His INR levels are checked by the Scientist, product/process development.   Dec. 10, 2014:  Patrick [name] is doing well. It is his birthday today. He is able to below his normal activities without any significant problems. He has chronic atrial fibrillation. He's on Coumadin and his INR levels are checked at the medical Oceanographer.  Dec. 11, 2015:  Patrick [name] is a 83 yo with hx of A-fib and HTN.  His Birthday was yesterday. Still active. Mows the lawn. Works in his garden in the spring time    Jan 21, 2015 Patrick [name] is a 83 y.o. male who presents for evaluation of atrial fib and fatigue .  He has had more fatigue renctly  His normal activities seem to tire him fairly quickly. No CP , no dyspnea .  Still wears his compression hose.   April 06, 2015:  Patrick [name] is doing well. BP has been running normal. - a bit low   Feb. 7, 2017:  Feeling well. Had some bronchitis over Christmas.   Had some leg swelling .   Was started on Lasix dr. Joylene Draft .   Swelling has resolved.  BP has been ok.   No dizziness. Metoprolol was also decresed.  HR is still low but no dizziness or orthostatic symptoms   Sept. 22, 2017:  Staying fairly active.  Had a good garden this past year.  Raised tomatoes - had pretty good luck with those.    Fatigues easliy.   Legs give out easily. No claudication pain   Patrick [name] / MRM manages coumadin levels   November 22, 2016:  Still doing quite a bit. Slowing down now  Dr. Joylene Draft DC'd Eliquis yesterday due to leg bleeding in his right leg     Past Medical History:  Diagnosis Date  . AAA (abdominal aortic aneurysm) ([place])   . Adenomatous colon polyp 2004   TVA 04/2006  . Arthritis   . Atrial fibrillation ([place])   . Diverticulosis   . Hemorrhoids   . Hypercholesterolemia   . Hypertension     Past Surgical History:  Procedure Laterality Date  . ESOPHAGOGASTRODUODENOSCOPY N/A 09/25/2016   Procedure: ESOPHAGOGASTRODUODENOSCOPY (EGD);  Surgeon: Irene Shipper, MD;  Location: [facility];  Service: Endoscopy;  Laterality: N/A;  . HERNIA REPAIR  Age 7  . TONSILLECTOMY     Very Young     Current Outpatient Prescriptions  Medication Sig Dispense Refill  . atorvastatin (LIPITOR) 80 MG tablet Take 80 mg by mouth daily.      . ferrous sulfate 325 (65 FE) MG tablet Take 1 tablet (325 mg total) by mouth 3 (three) times daily with meals. 90 tablet 0  . furosemide (LASIX) 20 MG tablet Take 20 mg by mouth daily.  6  .  metoprolol tartrate (LOPRESSOR) 25 MG tablet Take 25 mg by mouth daily.  0  . neomycin-polymyxin b-dexamethasone (MAXITROL) 3.5-10000-0.1 SUSP Place 1 drop into both eyes 4 (four) times daily. Started 1/22 x 5 days  0  . Polyethyl Glycol-Propyl Glycol (SYSTANE OP) Apply 1 drop to eye 2 (two) times daily as needed (dry eyes).     . polyethylene glycol (MIRALAX) packet Take 17 g by mouth daily as needed. 14 each 0   No current facility-administered medications for this visit.     Allergies:   Patient has no known allergies.    Social History:  The patient  reports that he quit smoking about 47 years ago. His smoking use included Cigarettes. He has never used smokeless tobacco. He reports that he drinks alcohol. He reports that he does not use drugs.   Family History:  The  patient's family history includes Emphysema in his father.    ROS:  Please see the history of present illness.    Review of Systems: Constitutional:  denies fever, chills, diaphoresis, appetite change and fatigue.  HEENT: denies photophobia, eye pain, redness, hearing loss, ear pain, congestion, sore throat, rhinorrhea, sneezing, neck pain, neck stiffness and tinnitus.  Respiratory: denies SOB, DOE, cough, chest tightness, and wheezing.  Cardiovascular: denies chest pain, palpitations and leg swelling.  Gastrointestinal: denies nausea, vomiting, abdominal pain, diarrhea, constipation, blood in stool.  Genitourinary: denies dysuria, urgency, frequency, hematuria, flank pain and difficulty urinating.  Musculoskeletal: denies  myalgias, back pain, joint swelling, arthralgias and gait problem.   Skin: denies pallor, rash and wound.  Neurological: denies dizziness, seizures, syncope, weakness, light-headedness, numbness and headaches.   Hematological: denies adenopathy, easy bruising, personal or family bleeding history.  Psychiatric/ Behavioral: denies suicidal ideation, mood changes, confusion, nervousness, sleep disturbance and agitation.      All other systems are reviewed and negative.   PHYSICAL EXAM: VS:  BP 100/70 (BP Location: Right Arm, Patient Position: Sitting, Cuff Size: Normal)   Pulse (!) 58   Ht 5\' 9"  (1.753 m)   Wt 166 lb 6.4 oz (75.5 kg)   SpO2 93%   BMI 24.57 kg/m  , BMI Body mass index is 24.57 kg/m. GEN: Well nourished, well developed, in no acute distress  HEENT: normal  Neck: no JVD, carotid bruits, or masses Cardiac: Irreg. Irreg. ; soft systolic murmur, rubs, or gallops,no edema , Reduced pulses in feet.  Respiratory:  clear to auscultation bilaterally, normal work of breathing GI: soft, nontender, nondistended, + BS MS: no deformity or atrophy ,  Decreased skin turgur  Skin: warm and dry, no rash Neuro:  Strength and sensation are intact Psych:  normal   EKG:  EKG is not ordered today.    Recent Labs: 09/24/2016: ALT 18; Magnesium 2.2; TSH 3.595 09/26/2016: BUN 47; Creatinine, Ser 1.35; Hemoglobin 8.1; Platelets 82; Potassium 4.0; Sodium 145    Lipid Panel No results found for: CHOL, TRIG, HDL, CHOLHDL, VLDL, LDLCALC, LDLDIRECT    Wt Readings from Last 3 Encounters:  11/22/16 166 lb 6.4 oz (75.5 kg)  09/23/16 170 lb (77.1 kg)  09/02/16 167 lb (75.8 kg)     Other studies Reviewed: Additional studies/ records that were reviewed today include: . Review of the above records demonstrates:   ASSESSMENT AND PLAN:  1. Atrial Fibrillation - his rate is well-controlled.   Chananya is doing very well. Dr. Joylene Draft stopped his Eliquis yesterday due to right lower leg bleeding .  We discussed this would increase his  risk of CVA but I agree with the decision    2. Hyperlipidemia - followed by Dr. Joylene Draft. 3. Hypertension- blood pressure is well-controlled. 4. Leg fatigue.    His pulses are reduced in his feet Will refer for ABI / lower extremity duplex scan .   Current medicines are reviewed at length with the patient today.  The patient does not have concerns regarding medicines.  The following changes have been made:  no change  Labs/ tests ordered today include:  No orders of the defined types were placed in this encounter.  Disposition:   FU with me in 6 months    Mertie Moores, MD  11/22/2016 11:37 AM    [address] [place], [place],   [address] Phone: [phone]; Fax: [phone]

## 2016-11-22 NOTE — Patient Instructions (Signed)

## 2017-01-24 ENCOUNTER — Encounter: Payer: Self-pay | Admitting: Hematology and Oncology

## 2017-02-15 ENCOUNTER — Encounter (INDEPENDENT_AMBULATORY_CARE_PROVIDER_SITE_OTHER): Payer: Self-pay | Admitting: Orthopaedic Surgery

## 2017-02-15 ENCOUNTER — Ambulatory Visit (INDEPENDENT_AMBULATORY_CARE_PROVIDER_SITE_OTHER): Payer: Medicare Other

## 2017-02-15 ENCOUNTER — Ambulatory Visit (INDEPENDENT_AMBULATORY_CARE_PROVIDER_SITE_OTHER): Payer: Medicare Other | Admitting: Orthopaedic Surgery

## 2017-02-15 DIAGNOSIS — M25561 Pain in right knee: Secondary | ICD-10-CM

## 2017-02-15 DIAGNOSIS — M25562 Pain in left knee: Secondary | ICD-10-CM

## 2017-02-15 DIAGNOSIS — G8929 Other chronic pain: Secondary | ICD-10-CM | POA: Insufficient documentation

## 2017-02-15 DIAGNOSIS — M25461 Effusion, right knee: Secondary | ICD-10-CM | POA: Diagnosis not present

## 2017-02-15 DIAGNOSIS — M25462 Effusion, left knee: Secondary | ICD-10-CM

## 2017-02-15 MED ORDER — METHYLPREDNISOLONE ACETATE 40 MG/ML IJ SUSP
40.0000 mg | INTRAMUSCULAR | Status: AC | PRN
  Administered 2017-02-15: 40 mg via INTRA_ARTICULAR

## 2017-02-15 MED ORDER — LIDOCAINE HCL 1 % IJ SOLN
3.0000 mL | INTRAMUSCULAR | Status: AC | PRN
  Administered 2017-02-15: 3 mL

## 2017-02-15 NOTE — Progress Notes (Signed)
Office Visit Note   Patient: Patrick [name]           Date of Birth: 10/27/1920           MRN: [id number] Visit Date: 02/15/2017              Requested by: Crist Infante, MD [address] [place], [place] [address] PCP: Crist Infante, MD   Assessment & Plan: Visit Diagnoses:  1. Chronic pain of both knees   2. Effusion of both knee joints     Plan: I was able to easily aspirate 20 mL of fluid from his right knee about 50 mL of fluid from his left knee both indicative arthritic changes. I'll place an injection of 5 mL lidocaine and 1 mL Depo-Medrol in each knee which he tolerated well. I do feel he is a perfect candidate for hyaluronic acid. We'll order that and put this in both his knees in about 4 weeks. All questions were answered. Also sent him quadrant strengthening exercises and try.  Follow-Up Instructions: Return in about 4 weeks (around 03/15/2017).   Orders:  Orders Placed This Encounter  Procedures  . Large Joint Injection/Arthrocentesis  . Large Joint Injection/Arthrocentesis  . XR Knee 1-2 Views Left  . XR Knee 1-2 Views Right   No orders of the defined types were placed in this encounter.     Procedures: Large Joint Inj Date/Time: 02/15/2017 2:21 PM Performed by: Mcarthur Rossetti Authorized by: Jean Rosenthal Y   Location:  Knee Site:  R knee Ultrasound Guidance: No   Fluoroscopic Guidance: No   Arthrogram: No   Medications:  3 mL lidocaine 1 %; 40 mg methylPREDNISolone acetate 40 MG/ML Large Joint Inj Date/Time: 02/15/2017 2:21 PM Performed by: Mcarthur Rossetti Authorized by: Mcarthur Rossetti   Location:  Knee Site:  L knee Ultrasound Guidance: No   Fluoroscopic Guidance: No   Arthrogram: No   Medications:  3 mL lidocaine 1 %; 40 mg methylPREDNISolone acetate 40 MG/ML     Clinical Data: No additional findings.   Subjective: Chief Complaint  Patient presents with  . Left Knee - Pain  . Right Knee - Pain    Patient is very pleasant 83 year old sent from Dr. Crist Infante of the [facility] to evaluate bilateral knee pain. He tries to be as active as possible when he is not active and he gets up and moving the knees her quite a bit. Both knees swell as well and he denies any injuries is a lot of these knees over the years. He is not any type of injections recently. He denies any locking catching but just stiffness and pain. HPI  Review of Systems He denies any headache, chest pain, shortness of breath, fever, chills, nausea, vomiting.  Objective: Vital Signs: There were no vitals taken for this visit.  Physical Exam He is alert and 3 and walks with a slight limp he is in no acute distress. He does not use an assistive device candidate. Ortho Exam Both knee show moderate knee joint effusions with good range of motion but they're deathly stiff. He lacks full extension by about 3 on both knees. Both knees feel ligamentously stable. Specialty Comments:  No specialty comments available.  Imaging: No results found.   PMFS History: Patient Active Problem List   Diagnosis Date Noted  . Chronic pain of both knees 02/15/2017  . Effusion of both knee joints 02/15/2017  . Angiodysplasia of duodenum   .  Melena 09/23/2016  . Blood loss anemia 09/23/2016  . Hyperkalemia 09/23/2016  . HTN (hypertension) 08/08/2014  . AAA (abdominal aortic aneurysm) ([place]) 05/03/2013  . 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 Abdominal aneurysm without mention of rupture 08/06/2012  . Atrial fibrillation ([place]) 06/15/2009  . PERSONAL HISTORY OF COLONIC POLYPS 06/15/2009   Past Medical History:  Diagnosis Date  . AAA (abdominal aortic aneurysm) ([place])   . Adenomatous colon polyp 2004   TVA 04/2006  . Arthritis   . Atrial fibrillation ([place])   . Diverticulosis   . Hemorrhoids   . Hypercholesterolemia   . Hypertension     Family History  Problem Relation Age of Onset  . Emphysema  Father   . Diabetes Neg Hx   . Cancer Neg Hx     Past Surgical History:  Procedure Laterality Date  . ESOPHAGOGASTRODUODENOSCOPY N/A 09/25/2016   Procedure: ESOPHAGOGASTRODUODENOSCOPY (EGD);  Surgeon: Irene Shipper, MD;  Location: [facility];  Service: Endoscopy;  Laterality: N/A;  . HERNIA REPAIR  Age 22  . TONSILLECTOMY     Very Young   Social History   Occupational History  . retired Retired   Social History Main Topics  . Smoking status: Former Smoker    Types: Cigarettes    Quit date: 08/29/1969  . Smokeless tobacco: Never Used  . Alcohol use Yes     Comment: glass of wine 1-2 times per week  . Drug use: No  . Sexual activity: Not on file

## 2017-02-17 ENCOUNTER — Ambulatory Visit (INDEPENDENT_AMBULATORY_CARE_PROVIDER_SITE_OTHER): Payer: Medicare Other | Admitting: Podiatry

## 2017-02-17 ENCOUNTER — Encounter: Payer: Self-pay | Admitting: Podiatry

## 2017-02-17 DIAGNOSIS — M79676 Pain in unspecified toe(s): Secondary | ICD-10-CM | POA: Diagnosis not present

## 2017-02-17 DIAGNOSIS — B351 Tinea unguium: Secondary | ICD-10-CM

## 2017-02-17 NOTE — Progress Notes (Signed)
Patient ID: Patrick [name], male   DOB: [birth year], 83 y.o.   MRN: [id number] Complaint:  Visit Type: Patient returns to my office for continued preventative foot care services. Complaint: Patient states" my nails have grown long and thick and become painful to walk and wear shoes" . The patient presents for preventative foot care services. No changes to ROS  Podiatric Exam: Vascular: dorsalis pedis and posterior tibial pulses are not  Palpable due to his unna boots  bilateral. Capillary return is immediate. Temperature gradient is WNL. Skin turgor WNL .  Purplish discoloration both feet. Sensorium: Normal Semmes Weinstein monofilament test. Normal tactile sensation bilaterally. Nail Exam: Pt has thick disfigured discolored nails with subungual debris noted bilateral entire nail hallux through fifth toenails Ulcer Exam: There is no evidence of ulcer or pre-ulcerative changes or infection. Orthopedic Exam: Muscle tone and strength are WNL. No limitations in general ROM. No crepitus or effusions noted. Foot type and digits show no abnormalities. Bony prominences are unremarkable. Skin: No Porokeratosis. No infection or ulcers  Diagnosis:  Onychomycosis, , Pain in right toe, pain in left toes  Treatment & Plan Procedures and Treatment: Consent by patient was obtained for treatment procedures. The patient understood the discussion of treatment and procedures well. All questions were answered thoroughly reviewed. Debridement of mycotic and hypertrophic toenails, 1 through 5 bilateral and clearing of subungual debris. No ulceration, no infection noted.  Return Visit-Office Procedure: Patient instructed to return to the office for a follow up visit prn for continued evaluation and treatment.   Gardiner Barefoot DPM

## 2017-02-20 ENCOUNTER — Ambulatory Visit (HOSPITAL_BASED_OUTPATIENT_CLINIC_OR_DEPARTMENT_OTHER): Payer: Medicare Other

## 2017-02-20 ENCOUNTER — Ambulatory Visit (HOSPITAL_BASED_OUTPATIENT_CLINIC_OR_DEPARTMENT_OTHER): Payer: Medicare Other | Admitting: Hematology and Oncology

## 2017-02-20 DIAGNOSIS — D696 Thrombocytopenia, unspecified: Secondary | ICD-10-CM

## 2017-02-20 LAB — CBC WITH DIFFERENTIAL/PLATELET
BASO%: 1.3 % (ref 0.0–2.0)
Basophils Absolute: 0.1 10*3/uL (ref 0.0–0.1)
EOS%: 3.4 % (ref 0.0–7.0)
Eosinophils Absolute: 0.2 10*3/uL (ref 0.0–0.5)
HEMATOCRIT: 39.4 % (ref 38.4–49.9)
HGB: 12.8 g/dL — ABNORMAL LOW (ref 13.0–17.1)
LYMPH#: 0.7 10*3/uL — AB (ref 0.9–3.3)
LYMPH%: 11.6 % — ABNORMAL LOW (ref 14.0–49.0)
MCH: 32.2 pg (ref 27.2–33.4)
MCHC: 32.6 g/dL (ref 32.0–36.0)
MCV: 98.9 fL — ABNORMAL HIGH (ref 79.3–98.0)
MONO#: 0.8 10*3/uL (ref 0.1–0.9)
MONO%: 13.2 % (ref 0.0–14.0)
NEUT%: 70.5 % (ref 39.0–75.0)
NEUTROS ABS: 4.5 10*3/uL (ref 1.5–6.5)
PLATELETS: 68 10*3/uL — AB (ref 140–400)
RBC: 3.99 10*6/uL — AB (ref 4.20–5.82)
RDW: 16.2 % — AB (ref 11.0–14.6)
WBC: 6.4 10*3/uL (ref 4.0–10.3)

## 2017-02-20 NOTE — Progress Notes (Signed)
[facility] NOTE  Patient Care Team: Crist Infante, MD as PCP - General (Internal Medicine) Nahser, Wonda Cheng, MD as Consulting Physician (Cardiology) Bronson Ing, DPM as Consulting Physician (Podiatry) Ladene Artist, MD as Consulting Physician (Gastroenterology)  CHIEF COMPLAINTS/PURPOSE OF CONSULTATION:  Thrombocytopenia  HISTORY OF PRESENTING ILLNESS:  Patrick [name] 83 y.o. male is here because of recent diagnosis of thrombocytopenia along with anemia. Patient was hospitalized in January 2018 with angiodysplasia of the duodenum that caused acute GI bleeding. At that time his platelet count had come down to 70s and 80s. He has also history of easy bruisability. In March 2018 he had repeat blood testing which revealed a platelet count of 77 on 11/21/2016 and 53 on 11/22/2016. This prompted the consultation request for hematology. Patient although is 83 years old is in excellent physical shape. He is able to drive himself everywhere.  I reviewed her records extensively and collaborated the history with the patient.  MEDICAL HISTORY:  Past Medical History:  Diagnosis Date  . AAA (abdominal aortic aneurysm) ([place])   . Adenomatous colon polyp 2004   TVA 04/2006  . Arthritis   . Atrial fibrillation ([place])   . Diverticulosis   . Hemorrhoids   . Hypercholesterolemia   . Hypertension     SURGICAL HISTORY: Past Surgical History:  Procedure Laterality Date  . ESOPHAGOGASTRODUODENOSCOPY N/A 09/25/2016   Procedure: ESOPHAGOGASTRODUODENOSCOPY (EGD);  Surgeon: Irene Shipper, MD;  Location: [facility];  Service: Endoscopy;  Laterality: N/A;  . HERNIA REPAIR  Age 75  . TONSILLECTOMY     Very Young    SOCIAL HISTORY: Social History   Social History  . Marital status: Divorced    Spouse name: N/A  . Number of children: 3  . Years of education: N/A   Occupational History  . retired Retired   Social History Main Topics  . Smoking status: Former Smoker   Types: Cigarettes    Quit date: 08/29/1969  . Smokeless tobacco: Never Used  . Alcohol use Yes     Comment: glass of wine 1-2 times per week  . Drug use: No  . Sexual activity: Not on file   Other Topics Concern  . Not on file   Social History Narrative  . No narrative on file    FAMILY HISTORY: Family History  Problem Relation Age of Onset  . Emphysema Father   . Diabetes Neg Hx   . Cancer Neg Hx     ALLERGIES:  has No Known Allergies.  MEDICATIONS:  Current Outpatient Prescriptions  Medication Sig Dispense Refill  . atorvastatin (LIPITOR) 80 MG tablet Take 80 mg by mouth daily.      . ferrous sulfate 325 (65 FE) MG tablet Take 1 tablet (325 mg total) by mouth 3 (three) times daily with meals. 90 tablet 0  . furosemide (LASIX) 20 MG tablet Take 20 mg by mouth daily.  6  . metoprolol tartrate (LOPRESSOR) 25 MG tablet Take 25 mg by mouth daily.  0  . neomycin-polymyxin b-dexamethasone (MAXITROL) 3.5-10000-0.1 SUSP Place 1 drop into both eyes 4 (four) times daily. Started 1/22 x 5 days  0  . Polyethyl Glycol-Propyl Glycol (SYSTANE OP) Apply 1 drop to eye 2 (two) times daily as needed (dry eyes).     . polyethylene glycol (MIRALAX) packet Take 17 g by mouth daily as needed. 14 each 0   No current facility-administered medications for this visit.     REVIEW OF SYSTEMS:  Constitutional: Denies fevers, chills or abnormal night sweats Eyes: Denies blurriness of vision, double vision or watery eyes Ears, nose, mouth, throat, and face: Denies mucositis or sore throat Respiratory: Denies cough, dyspnea or wheezes Cardiovascular: Denies palpitation, chest discomfort or lower extremity swelling Gastrointestinal:  Denies nausea, heartburn or change in bowel habits Skin: Denies abnormal skin rashes Lymphatics: Denies new lymphadenopathy or easy bruising Neurological:Denies numbness, tingling or new weaknesses Behavioral/Psych: Mood is stable, no new changes   All other systems  were reviewed with the patient and are negative.  PHYSICAL EXAMINATION: ECOG PERFORMANCE STATUS: 1 - Symptomatic but completely ambulatory  Vitals:   02/20/17 1312  BP: 129/63  Pulse: (!) 51  Resp: 18  Temp: 97.5 F (36.4 C)   Filed Weights   02/20/17 1312  Weight: 168 lb 14.4 oz (76.6 kg)    GENERAL:alert, no distress and comfortable SKIN: skin color, texture, turgor are normal, no rashes or significant lesions EYES: normal, conjunctiva are pink and non-injected, sclera clear OROPHARYNX:no exudate, no erythema and lips, buccal mucosa, and tongue normal  NECK: supple, thyroid normal size, non-tender, without nodularity LYMPH:  no palpable lymphadenopathy in the cervical, axillary or inguinal LUNGS: clear to auscultation and percussion with normal breathing effort HEART: regular rate & rhythm and no murmurs and no lower extremity edema ABDOMEN:abdomen soft, non-tender and normal bowel sounds, no hepatosplenomegaly Musculoskeletal:no cyanosis of digits and no clubbing  PSYCH: alert & oriented x 3 with fluent speech NEURO: no focal motor/sensory deficits   LABORATORY DATA:  I have reviewed the data as listed Lab Results  Component Value Date   WBC 5.1 09/26/2016   HGB 8.1 (L) 09/26/2016   HCT 25.4 (L) 09/26/2016   MCV 96.6 09/26/2016   PLT 82 (L) 09/26/2016   Lab Results  Component Value Date   NA 145 09/26/2016   K 4.0 09/26/2016   CL 111 09/26/2016   CO2 25 09/26/2016    RADIOGRAPHIC STUDIES: I have personally reviewed the radiological reports and agreed with the findings in the report.  ASSESSMENT AND PLAN:  Thrombocytopenia ([place]) First noticed January 2018 with platelet count of 101 During that hospitalization it had decreased to 70s and 80s.  Most recent blood work  11/21/2016: Platelets 77 11/22/2016: Platelets 53 Hemoglobin 11.3, MCV 101, WBC 6.63 with normal differential  Differential diagnosis: 1. Increased consumption due to recent GI blood loss  from angiodysplasia of the duodenum versus decreased production 2. since the remainder of the CBC is relatively normal, isolated thrombocytopenia raises the differential diagnosis of ITP as well. 3. We will obtain a CBC in a blue top tube to make sure there is no clumping.  I do not intend to do any bone marrow evaluations because of his elderly age. The goal of platelet transfusions only for symptomatic bleeding. I reviewed his medication list to make sure there are no medications that can make his thrombocytopenia worse. Since our goal is conservative management, I recommended every 3 month monitoring of his CBC.  Iron deficiency anemia: On oral iron supplementation. His hemoglobin has normalized. Patient is no longer on blood thinners. I will call him with the results of CBC. I will see the patient back in 3 months. If his platelets have recovered and we don't have to see him again.  All questions were answered. The patient knows to call the clinic with any problems, questions or concerns.    Rulon Eisenmenger, MD 02/20/17

## 2017-02-20 NOTE — Assessment & Plan Note (Addendum)
First noticed January 2018 with platelet count of 101 During that hospitalization it had decreased to 70s and 80s.  Most recent blood work  11/21/2016: Platelets 77 11/22/2016: Platelets 53 Hemoglobin 11.3, MCV 101, WBC 6.63 with normal differential  Differential diagnosis: 1. Increased consumption due to recent GI blood loss from angiodysplasia of the duodenum versus decreased production 2. since the remainder of the CBC is relatively normal, isolated thrombocytopenia raises the differential diagnosis of ITP as well. 3. We will obtain a CBC in a blue top tube to make sure there is no clumping.  I do not intend to do any bone marrow evaluations because of his elderly age. The goal of platelet transfusions only for symptomatic bleeding. I reviewed his medication list to make sure there are no medications that can make his thrombocytopenia worse. Since our goal is conservative management, I recommended monthly monitoring of his CBC.  Iron deficiency anemia: On oral iron supplementation.

## 2017-02-27 ENCOUNTER — Encounter: Payer: Self-pay | Admitting: Family

## 2017-03-10 ENCOUNTER — Encounter: Payer: Self-pay | Admitting: Family

## 2017-03-10 ENCOUNTER — Ambulatory Visit (INDEPENDENT_AMBULATORY_CARE_PROVIDER_SITE_OTHER): Payer: Medicare Other | Admitting: Family

## 2017-03-10 ENCOUNTER — Ambulatory Visit (HOSPITAL_COMMUNITY)
Admission: RE | Admit: 2017-03-10 | Discharge: 2017-03-10 | Disposition: A | Discharge status: Home / Self Care | Payer: Medicare Other | Source: Ambulatory Visit | Attending: Vascular Surgery | Admitting: Vascular Surgery

## 2017-03-10 VITALS — BP 136/69 | HR 53 | Temp 97.3°F | Resp 20 | Ht 69.0 in | Wt 168.0 lb

## 2017-03-10 DIAGNOSIS — Z87891 Personal history of nicotine dependence: Secondary | ICD-10-CM

## 2017-03-10 DIAGNOSIS — I723 Aneurysm of iliac artery: Secondary | ICD-10-CM | POA: Insufficient documentation

## 2017-03-10 DIAGNOSIS — I714 Abdominal aortic aneurysm, without rupture, unspecified: Secondary | ICD-10-CM

## 2017-03-10 NOTE — Progress Notes (Signed)
[facility] [place]   CC: Follow up Right Common Iliac Artery Aneurysm and Abdominal Aortic Aneurysm  History of Present Illness  Patrick [name] is a 83 y.o. (1921/07/05) male patient of Dr. Bridgett Larsson who presents with chief complaint: follow up for AAA and right CIA aneurysm. Previous studies demonstrate an AAA, measuring 3.1 cm. The patient does not have back or abdominal pain. The patient is not a smoker. He has not had embolic sx to either foot.  He takes coumadin for atrial fib  He denies any known back problems. His legs "give out" after walking about 15-20 minutes. The patient denies pain in legs with walking, denies non healing wounds. The patient denies history of stroke or TIA symptoms.  Pt Diabetic: borderline Pt smoker: former smoker, quit in the 1970's, smoked for about 20 years    Past Medical History:  Diagnosis Date  . AAA (abdominal aortic aneurysm) ([place])   . Adenomatous colon polyp 2004   TVA 04/2006  . Arthritis   . Atrial fibrillation ([place])   . Diverticulosis   . Hemorrhoids   . Hypercholesterolemia   . Hypertension    Past Surgical History:  Procedure Laterality Date  . ESOPHAGOGASTRODUODENOSCOPY N/A 09/25/2016   Procedure: ESOPHAGOGASTRODUODENOSCOPY (EGD);  Surgeon: Irene Shipper, MD;  Location: [facility];  Service: Endoscopy;  Laterality: N/A;  . HERNIA REPAIR  Age 40  . TONSILLECTOMY     Very Young   Social History Social History   Social History  . Marital status: Divorced    Spouse name: N/A  . Number of children: 3  . Years of education: N/A   Occupational History  . retired Retired   Social History Main Topics  . Smoking status: Former Smoker    Types: Cigarettes    Quit date: 08/29/1969  . Smokeless tobacco: Never Used  . Alcohol use Yes     Comment: glass of wine 1-2 times per week  . Drug use: No  . Sexual activity: Not on file   Other Topics Concern  . Not on file   Social History Narrative  . No  narrative on file   Family History Family History  Problem Relation Age of Onset  . Emphysema Father   . Diabetes Neg Hx   . Cancer Neg Hx     Current Outpatient Prescriptions on File Prior to Visit  Medication Sig Dispense Refill  . atorvastatin (LIPITOR) 80 MG tablet Take 80 mg by mouth daily.      . ferrous sulfate 325 (65 FE) MG tablet Take 1 tablet (325 mg total) by mouth 3 (three) times daily with meals. 90 tablet 0  . furosemide (LASIX) 20 MG tablet Take 80 mg by mouth daily.   6  . metoprolol tartrate (LOPRESSOR) 25 MG tablet Take 25 mg by mouth daily.  0  . Polyethyl Glycol-Propyl Glycol (SYSTANE OP) Apply 1 drop to eye 2 (two) times daily as needed (dry eyes).      No current facility-administered medications on file prior to visit.    No Known Allergies  ROS: See HPI for pertinent positives and negatives.  Physical Examination  Vitals:   03/10/17 0950  BP: 136/69  Pulse: (!) 53  Resp: 20  Temp: (!) 97.3 F (36.3 C)  TempSrc: Oral  SpO2: 96%  Weight: 168 lb (76.2 kg)  Height: 5\' 9"  (1.753 m)   Body mass index is 24.81 kg/m.  General: A&O x 3, WDWN  Pulmonary: Sym  exp, respirations are non labored, good air movt, CTAB, no rales, rhonchi, & wheezing  Cardiac: Irregular rhythm, bradycardic rate (on a beta blocker), no detected murmurs.  Vascular: Vessel Right Left  Radial 2+Palpable 2+Palpable  Carotid Palpable, without bruit Palpable, without bruit  Aorta Not palpable N/A  Femoral Palpable Palpable  Popliteal Not palpable Not palpable  PT Not Palpable,  Not  Palpable  DP palpable Not  palpable   Gastrointestinal: soft, NTND, -G/R, - HSM, - palpable masses, - CVAT B  Musculoskeletal: M/S 5/5 throughout, Extremities without ischemic changes. No lower extremity edema, + hemosiderin deposits both anterior tibial areas, pt was wearing knee high compression hose. Feet and hands are pink  and warm.   Neurologic: Pain and light touch intact in extremities , Motor exam as listed above. CN 2-12 intact except is hard of hearing.    Non-Invasive Vascular Imaging  AAA Duplex (03/10/2017)  Previous size: 3.7 cm (Date: 09-02-16) Right CIA: 2.7 cm; Left CIA: 1.01 cm   Current size:  3.3 cm (Date: 03-10-17), Right CIA: 2.6 cm; Left CIA: 1.2 cm  Medical Decision Making  The patient is a 83 y.o. male who presents with asymptomatic AAA with no increase in size, 3.3 cm today; right CIA aneurysm stable at 2.6 cm.    Based on this patient's exam and diagnostic studies, the patient will follow up in 1 year  with the following studies: AAA duplex.  Consideration for repair of AAA would be made when the size is 5.5 cm, growth > 1 cm/yr, and symptomatic status.       Consideration for repair of common iliac artery aneurysm would be made when the size is 3.0 cm.        The patient was given information about AAA including signs, symptoms, treatment, and how to minimize the risk of enlargement and rupture of aneurysms.    I emphasized the importance of maximal medical management including strict control of blood pressure, blood glucose, and lipid levels, antiplatelet agents, obtaining regular exercise, and continued cessation of smoking.   The patient was advised to call 911 should the patient experience sudden onset abdominal or back pain.   Thank you for allowing [place] to participate in this patient's care.  Clemon Chambers, RN, MSN, FNP-C [facility] [place] Office: [phone]  Clinic Physician: Donzetta Matters  03/10/2017, 2:22 PM

## 2017-03-10 NOTE — Patient Instructions (Signed)
Abdominal Aortic Aneurysm Blood pumps away from the heart through tubes (blood vessels) called arteries. Aneurysms are weak or damaged places in the wall of an artery. It bulges out like a balloon. An abdominal aortic aneurysm happens in the main artery of the body (aorta). It can burst or tear, causing bleeding inside the body. This is an emergency. It needs treatment right away. What are the causes? The exact cause is unknown. Things that could cause this problem include:  Fat and other substances building up in the lining of a tube.  Swelling of the walls of a blood vessel.  Certain tissue diseases.  Belly (abdominal) trauma.  An infection in the main artery of the body.  What increases the risk? There are things that make it more likely for you to have an aneurysm. These include:  Being over the age of 81 years old.  Having high blood pressure (hypertension).  Being a male.  Being white.  Being very overweight (obese).  Having a family history of aneurysm.  Using tobacco products.  What are the signs or symptoms? Symptoms depend on the size of the aneurysm and how fast it grows. There may not be symptoms. If symptoms occur, they can include:  Pain (belly, side, lower back, or groin).  Feeling full after eating a small amount of food.  Feeling sick to your stomach (nauseous), throwing up (vomiting), or both.  Feeling a lump in your belly that feels like it is beating (pulsating).  Feeling like you will pass out (faint).  How is this treated?  Medicine to control blood pressure and pain.  Imaging tests to see if the aneurysm gets bigger.  Surgery. How is this prevented? To lessen your chance of getting this condition:  Stop smoking. Stop chewing tobacco.  Limit or avoid alcohol.  Keep your blood pressure, blood sugar, and cholesterol within normal limits.  Eat less salt.  Eat foods low in saturated fats and cholesterol. These are found in animal and  whole dairy products.  Eat more fiber. Fiber is found in whole grains, vegetables, and fruits.  Keep a healthy weight.  Stay active and exercise often.  This information is not intended to replace advice given to you by your health care provider. Make sure you discuss any questions you have with your health care provider. Document Released: 12/10/2012 Document Revised: 01/21/2016 Document Reviewed: 09/14/2012 Elsevier Interactive Patient Education  2017 [employer]  

## 2017-03-13 NOTE — Addendum Note (Signed)
Addended by: Lianne Cure A on: 03/13/2017 02:55 PM   Modules accepted: Orders

## 2017-03-16 ENCOUNTER — Ambulatory Visit (INDEPENDENT_AMBULATORY_CARE_PROVIDER_SITE_OTHER): Payer: Medicare Other | Admitting: Orthopaedic Surgery

## 2017-03-16 DIAGNOSIS — M25461 Effusion, right knee: Secondary | ICD-10-CM

## 2017-03-16 DIAGNOSIS — M25462 Effusion, left knee: Principal | ICD-10-CM

## 2017-03-16 DIAGNOSIS — G8929 Other chronic pain: Secondary | ICD-10-CM

## 2017-03-16 DIAGNOSIS — M1712 Unilateral primary osteoarthritis, left knee: Secondary | ICD-10-CM | POA: Diagnosis not present

## 2017-03-16 DIAGNOSIS — M1711 Unilateral primary osteoarthritis, right knee: Secondary | ICD-10-CM

## 2017-03-16 DIAGNOSIS — M25562 Pain in left knee: Secondary | ICD-10-CM

## 2017-03-16 DIAGNOSIS — M25561 Pain in right knee: Secondary | ICD-10-CM

## 2017-03-16 MED ORDER — HYLAN G-F 20 48 MG/6ML IX SOSY
48.0000 mg | PREFILLED_SYRINGE | INTRA_ARTICULAR | Status: AC | PRN
  Administered 2017-03-16: 48 mg via INTRA_ARTICULAR

## 2017-03-16 NOTE — Progress Notes (Signed)
He is here status post bilateral knee steroid injections. He is 83 years old and we're setting up today to come in for bilateral hyaluronic acid injections with Synvisc 1 in both knees. He said steroid injection is doing great is looking forward to the hyaluronic acid injections.  He said his pain is minimal.  On examination of his knees is got good range of motion of both knees and no effusion today. We did drain a significant effusion of both knees last visit.  He tolerated hyaluronic acid in both knees well. He'll follow-up as needed. He knows that if he gets to be to 3 months from now he like to have steroid injections in his knees again not to hesitate to give [place] a call. All questions were encouraged and answered.

## 2017-03-16 NOTE — Progress Notes (Signed)
   Procedure Note  Patient: Patrick [name]             Date of Birth: 04-Jun-1921           MRN: [id number]             Visit Date: 03/16/2017  Procedures: Visit Diagnoses: Effusion of both knee joints  Chronic pain of both knees  Unilateral primary osteoarthritis, left knee  Unilateral primary osteoarthritis, right knee  Large Joint Inj Date/Time: 03/16/2017 3:07 PM Performed by: Mcarthur Rossetti Authorized by: Mcarthur Rossetti   Location:  Knee Ultrasound Guidance: No   Fluoroscopic Guidance: No   Arthrogram: No   Medications:  48 mg Hylan 48 MG/6ML Large Joint Inj Date/Time: 03/16/2017 3:08 PM Performed by: Mcarthur Rossetti Authorized by: Mcarthur Rossetti   Location:  Knee Site:  L knee Ultrasound Guidance: No   Fluoroscopic Guidance: No   Arthrogram: No   Medications:  48 mg Hylan 48 MG/6ML

## 2017-04-03 ENCOUNTER — Other Ambulatory Visit: Payer: Self-pay | Admitting: Internal Medicine

## 2017-04-03 DIAGNOSIS — M5416 Radiculopathy, lumbar region: Secondary | ICD-10-CM

## 2017-04-03 DIAGNOSIS — R29898 Other symptoms and signs involving the musculoskeletal system: Secondary | ICD-10-CM

## 2017-04-06 ENCOUNTER — Ambulatory Visit
Admission: RE | Admit: 2017-04-06 | Discharge: 2017-04-06 | Disposition: A | Discharge status: Home / Self Care | Payer: Medicare Other | Source: Ambulatory Visit | Attending: Internal Medicine | Admitting: Internal Medicine

## 2017-04-06 DIAGNOSIS — R29898 Other symptoms and signs involving the musculoskeletal system: Secondary | ICD-10-CM

## 2017-04-06 DIAGNOSIS — M5416 Radiculopathy, lumbar region: Secondary | ICD-10-CM

## 2017-05-19 ENCOUNTER — Encounter: Payer: Self-pay | Admitting: Podiatry

## 2017-05-19 ENCOUNTER — Ambulatory Visit (INDEPENDENT_AMBULATORY_CARE_PROVIDER_SITE_OTHER): Payer: Medicare Other | Admitting: Podiatry

## 2017-05-19 DIAGNOSIS — M79676 Pain in unspecified toe(s): Secondary | ICD-10-CM

## 2017-05-19 DIAGNOSIS — B351 Tinea unguium: Secondary | ICD-10-CM | POA: Diagnosis not present

## 2017-05-19 NOTE — Progress Notes (Signed)
Patient ID: Patrick [name], male   DOB: 1921/08/12, 83 y.o.   MRN: [id number] Complaint:  Visit Type: Patient returns to my office for continued preventative foot care services. Complaint: Patient states" my nails have grown long and thick and become painful to walk and wear shoes" . The patient presents for preventative foot care services. No changes to ROS  Podiatric Exam: Vascular: dorsalis pedis and posterior tibial pulses are not  Palpable due to his unna boots  bilateral. Capillary return is immediate. Temperature gradient is WNL. Skin turgor WNL .  Purplish discoloration both feet. Sensorium: Normal Semmes Weinstein monofilament test. Normal tactile sensation bilaterally. Nail Exam: Pt has thick disfigured discolored nails with subungual debris noted bilateral entire nail hallux through fifth toenails Ulcer Exam: There is no evidence of ulcer or pre-ulcerative changes or infection. Orthopedic Exam: Muscle tone and strength are WNL. No limitations in general ROM. No crepitus or effusions noted. Foot type and digits show no abnormalities. Bony prominences are unremarkable. Skin: No Porokeratosis. No infection or ulcers  Diagnosis:  Onychomycosis, , Pain in right toe, pain in left toes  Treatment & Plan Procedures and Treatment: Consent by patient was obtained for treatment procedures. The patient understood the discussion of treatment and procedures well. All questions were answered thoroughly reviewed. Debridement of mycotic and hypertrophic toenails, 1 through 5 bilateral and clearing of subungual debris. No ulceration, no infection noted.  Return Visit-Office Procedure: Patient instructed to return to the office for a follow up visit 3 months  for continued evaluation and treatment.   Gardiner Barefoot DPM

## 2017-05-23 ENCOUNTER — Ambulatory Visit (HOSPITAL_BASED_OUTPATIENT_CLINIC_OR_DEPARTMENT_OTHER): Payer: Medicare Other | Admitting: Hematology and Oncology

## 2017-05-23 ENCOUNTER — Other Ambulatory Visit (HOSPITAL_BASED_OUTPATIENT_CLINIC_OR_DEPARTMENT_OTHER): Payer: Medicare Other

## 2017-05-23 ENCOUNTER — Telehealth (INDEPENDENT_AMBULATORY_CARE_PROVIDER_SITE_OTHER): Payer: Self-pay

## 2017-05-23 ENCOUNTER — Other Ambulatory Visit: Payer: Self-pay

## 2017-05-23 DIAGNOSIS — D696 Thrombocytopenia, unspecified: Secondary | ICD-10-CM

## 2017-05-23 LAB — CBC WITH DIFFERENTIAL/PLATELET
BASO%: 2 % (ref 0.0–2.0)
Basophils Absolute: 0.1 10*3/uL (ref 0.0–0.1)
EOS%: 4.3 % (ref 0.0–7.0)
Eosinophils Absolute: 0.2 10*3/uL (ref 0.0–0.5)
HCT: 35.7 % — ABNORMAL LOW (ref 38.4–49.9)
HGB: 11.8 g/dL — ABNORMAL LOW (ref 13.0–17.1)
LYMPH%: 16.2 % (ref 14.0–49.0)
MCH: 33.9 pg — ABNORMAL HIGH (ref 27.2–33.4)
MCHC: 33 g/dL (ref 32.0–36.0)
MCV: 102.7 fL — ABNORMAL HIGH (ref 79.3–98.0)
MONO#: 0.8 10*3/uL (ref 0.1–0.9)
MONO%: 16.4 % — ABNORMAL HIGH (ref 0.0–14.0)
NEUT#: 2.9 10*3/uL (ref 1.5–6.5)
NEUT%: 61.1 % (ref 39.0–75.0)
Platelets: 57 10*3/uL — ABNORMAL LOW (ref 140–400)
RBC: 3.47 10*6/uL — ABNORMAL LOW (ref 4.20–5.82)
RDW: 16.2 % — ABNORMAL HIGH (ref 11.0–14.6)
WBC: 4.8 10*3/uL (ref 4.0–10.3)
lymph#: 0.8 10*3/uL — ABNORMAL LOW (ref 0.9–3.3)

## 2017-05-23 NOTE — Assessment & Plan Note (Signed)
First noticed January 2018 with platelet count of 101 During that hospitalization it had decreased to 70s and 80s.  Most recent blood work  11/21/2016: Platelets 77 11/22/2016: Platelets 53 02/20/17: platelets 65; Hb 12.8  Differential diagnosis: 1. Increased consumption due to recent GI blood loss from angiodysplasia of the duodenum versus decreased production 2. since the remainder of the CBC is relatively normal, isolated thrombocytopenia raises the differential diagnosis of ITP as well.  RTC 6 months

## 2017-05-23 NOTE — Telephone Encounter (Signed)
LMOM for patient that I was returning his call and to call back for more detailed message please

## 2017-05-23 NOTE — Progress Notes (Signed)
Patient Care Team: Crist Infante, MD as PCP - General (Internal Medicine) Nahser, Wonda Cheng, MD as Consulting Physician (Cardiology) Ladene Artist, MD as Consulting Physician (Gastroenterology) Gardiner Barefoot, DPM as Consulting Physician (Podiatry)  DIAGNOSIS:  Encounter Diagnosis  Name Primary?  . Thrombocytopenia ([place])     CHIEF COMPLIANT: Follow-up of thrombocytopenia  INTERVAL HISTORY: Patrick [name] is a 83 year old male with above-mentioned history of thrombocytopenia who is currently under surveillance and observation. His platelet counts today have decreased to 57 from 68. He does not have any increased bruising or bleeding. He is still independent and is able to come to the appointments all by himself. He denies any bruising or bleeding symptoms.  REVIEW OF SYSTEMS:   Constitutional: Denies fevers, chills or abnormal weight loss Eyes: Denies blurriness of vision Ears, nose, mouth, throat, and face: Denies mucositis or sore throat Respiratory: Denies cough, dyspnea or wheezes Cardiovascular: Denies palpitation, chest discomfort Gastrointestinal:  Denies nausea, heartburn or change in bowel habits Skin: Denies abnormal skin rashes Lymphatics: Denies new lymphadenopathy or easy bruising Neurological:Denies numbness, tingling or new weaknesses Behavioral/Psych: Mood is stable, no new changes  Extremities: No lower extremity edema  All other systems were reviewed with the patient and are negative.  I have reviewed the past medical history, past surgical history, social history and family history with the patient and they are unchanged from previous note.  ALLERGIES:  has No Known Allergies.  MEDICATIONS:  Current Outpatient Prescriptions  Medication Sig Dispense Refill  . atorvastatin (LIPITOR) 80 MG tablet Take 80 mg by mouth daily.      . ferrous sulfate 325 (65 FE) MG tablet Take 1 tablet (325 mg total) by mouth 3 (three) times daily with meals. 90 tablet 0  .  furosemide (LASIX) 20 MG tablet Take 80 mg by mouth daily.   6  . lisinopril (PRINIVIL,ZESTRIL) 10 MG tablet Take 5 mg by mouth daily.     . metoprolol tartrate (LOPRESSOR) 25 MG tablet Take 25 mg by mouth daily.  0  . Omega-3 Fatty Acids (FISH OIL) 1000 MG CAPS Take by mouth.    Patrick [name] Glycol-Propyl Glycol (SYSTANE OP) Apply 1 drop to eye 2 (two) times daily as needed (dry eyes).      No current facility-administered medications for this visit.     PHYSICAL EXAMINATION: ECOG PERFORMANCE STATUS: 1 - Symptomatic but completely ambulatory  Vitals:   05/23/17 1127  BP: (!) 111/54  Pulse: (!) 49  Resp: 18  Temp: 97.9 F (36.6 C)  SpO2: 100%   Filed Weights   05/23/17 1127  Weight: 169 lb 6.4 oz (76.8 kg)    GENERAL:alert, no distress and comfortable SKIN: skin color, texture, turgor are normal, no rashes or significant lesions EYES: normal, Conjunctiva are pink and non-injected, sclera clear OROPHARYNX:no exudate, no erythema and lips, buccal mucosa, and tongue normal  NECK: supple, thyroid normal size, non-tender, without nodularity LYMPH:  no palpable lymphadenopathy in the cervical, axillary or inguinal LUNGS: clear to auscultation and percussion with normal breathing effort HEART: regular rate & rhythm and no murmurs and no lower extremity edema ABDOMEN:abdomen soft, non-tender and normal bowel sounds MUSCULOSKELETAL:no cyanosis of digits and no clubbing  NEURO: alert & oriented x 3 with fluent speech, no focal motor/sensory deficits EXTREMITIES: No lower extremity edema  LABORATORY DATA:  I have reviewed the data as listed   Chemistry      Component Value Date/Time   NA 145 09/26/2016 0524  K 4.0 09/26/2016 0524   CL 111 09/26/2016 0524   CO2 25 09/26/2016 0524   BUN 47 (H) 09/26/2016 0524   CREATININE 1.35 (H) 09/26/2016 0524   CREATININE 1.27 (H) 11/03/2015 1057      Component Value Date/Time   CALCIUM 8.6 (L) 09/26/2016 0524   ALKPHOS 121 09/24/2016  0645   AST 27 09/24/2016 0645   ALT 18 09/24/2016 0645   BILITOT 1.6 (H) 09/24/2016 0645       Lab Results  Component Value Date   WBC 4.8 05/23/2017   HGB 11.8 (L) 05/23/2017   HCT 35.7 (L) 05/23/2017   MCV 102.7 (H) 05/23/2017   PLT 57 (L) 05/23/2017   NEUTROABS 2.9 05/23/2017    ASSESSMENT & PLAN:  Thrombocytopenia ([place]) First noticed January 2018 with platelet count of 101 During that hospitalization it had decreased to 70s and 80s.  Most recent blood work  11/21/2016: Platelets 77 11/22/2016: Platelets 53 02/20/17: platelets 65; Hb 12.8 05/23/2017: Platelets 57  Differential diagnosis: 1. Increased consumption due to recent GI blood loss from angiodysplasia of the duodenum versus decreased production 2. since the remainder of the CBC is relatively normal, isolated thrombocytopenia raises the differential diagnosis of ITP as well. 3. With macrocytosis and mild anemia, myelodysplastic syndrome cannot be ruled out.  Given his advanced age and not planning on doing a bone marrow biopsy unless the platelets drop below 50.  RTC 3 months with labs   I spent 25 minutes talking to the patient of which more than half was spent in counseling and coordination of care.  No orders of the defined types were placed in this encounter.  The patient has a good understanding of the overall plan. he agrees with it. he will call with any problems that may develop before the next visit here.   Rulon Eisenmenger, MD 05/23/17

## 2017-05-23 NOTE — Telephone Encounter (Signed)
Patient called concerning a knee brace. Would like a call back.  CB# is [phone].  Please advise.  Thank You.

## 2017-06-09 NOTE — Progress Notes (Signed)
Cardiology Office Note    Date:  06/12/2017   ID:  Patrick [name], DOB [birth year], MRN [id number]  PCP:  Crist Infante, MD  Cardiologist:  Dr. Acie Fredrickson  Chief Complaint: 6 months follow up for afib  History of Present Illness:   Patrick [name] is a 83 y.o. male with hx of HTN, HLD, Thombocytopenia (follow by Dr. Lindi Adie), persistent atrial fibrillation, asymptomatic AAA and right CIA aneurysm followed by Dr. Bridgett Larsson and GI blood loss from angiodysplasia of the duodenum versus decreased production  presents for follow up.  ABI 05/27/2016 R 1.05 L 0.98  Last seen by Dr. Acie Fredrickson 11/23/2015. His Eliquis was help by Dr. Joylene Draft due to right leg bleeding a day prior to visit. Plan was to get ABI and lower extremity doppler for leg fatigue and reduced pulse however never done.   Here today for follow up.  Patient states that he is doing well overall.  Denies any leg pain.  He can walk 100 yards at a time.  No chest pain. He has some shortness of breath after walking and attributes this to age.  He denies orthopnea, PND, syncope, dizziness, or lower extremty edema, melena or blood in his stool or urine.   Past Medical History:  Diagnosis Date  . AAA (abdominal aortic aneurysm) ([place])   . Adenomatous colon polyp 2004   TVA 04/2006  . Arthritis   . Atrial fibrillation ([place])   . Diverticulosis   . Hemorrhoids   . Hypercholesterolemia   . Hypertension     Past Surgical History:  Procedure Laterality Date  . ESOPHAGOGASTRODUODENOSCOPY N/A 09/25/2016   Procedure: ESOPHAGOGASTRODUODENOSCOPY (EGD);  Surgeon: Irene Shipper, MD;  Location: [facility];  Service: Endoscopy;  Laterality: N/A;  . HERNIA REPAIR  Age 33  . TONSILLECTOMY     Very Young    Current Medications:  Prior to Admission medications   Medication Sig Start Date End Date Taking? Authorizing Provider  atorvastatin (LIPITOR) 80 MG tablet Take 80 mg by mouth daily.     Yes [provider]  ferrous sulfate 325 (65 FE) MG tablet  Take 1 tablet (325 mg total) by mouth 3 (three) times daily with meals. 09/26/16  Yes Debbe Odea, MD  furosemide (LASIX) 80 MG tablet Take 80 mg by mouth daily.   Yes [provider]  lisinopril (PRINIVIL,ZESTRIL) 10 MG tablet Take 5 mg by mouth daily.    Yes [provider]  metoprolol tartrate (LOPRESSOR) 25 MG tablet Take 25 mg by mouth 2 (two) times daily.  03/04/16  Yes [provider]  Omega-3 Fatty Acids (FISH OIL) 1000 MG CAPS Take 1,000 mg by mouth daily.    Yes [provider]  Polyethyl Glycol-Propyl Glycol (SYSTANE OP) Apply 1 drop to eye 2 (two) times daily as needed (dry eyes).    Yes [provider]    Allergies:   Patient has no known allergies.   Social History   Social History  . Marital status: Divorced    Spouse name: N/A  . Number of children: 3  . Years of education: N/A   Occupational History  . retired Retired   Social History Main Topics  . Smoking status: Former Smoker    Types: Cigarettes    Quit date: 08/29/1969  . Smokeless tobacco: Never Used  . Alcohol use Yes     Comment: glass of wine 1-2 times per week  . Drug use: No  . Sexual activity: Not Asked  Other Topics Concern  . None   Social History Narrative  . None     Family History:  The patient's family history includes Emphysema in his father.   ROS:   Please see the history of present illness.    ROS All other systems reviewed and are negative.   PHYSICAL EXAM:   VS:  BP (!) 118/50   Pulse (!) 50   Ht 5\' 9"  (1.753 m)   Wt 171 lb 1.9 oz (77.6 kg)   BMI 25.27 kg/m    GEN: Well nourished, well developed, in no acute distress  HEENT: normal  Neck: no JVD, carotid bruits, or masses Cardiac: Ir IR; soft systolic murmurs, rubs, or gallops,no edema. Weak lower extremity pulse bilaterally Respiratory:  clear to auscultation bilaterally, normal work of breathing GI: soft, nontender, nondistended, + BS MS: no deformity or atrophy  Skin: warm  and dry, no rash Neuro:  Alert and Oriented x 3, Strength and sensation are intact Psych: euthymic mood, full affect  Wt Readings from Last 3 Encounters:  06/12/17 171 lb 1.9 oz (77.6 kg)  05/23/17 169 lb 6.4 oz (76.8 kg)  03/10/17 168 lb (76.2 kg)      Studies/Labs Reviewed:   EKG:  EKG is not ordered today.    Recent Labs: 09/24/2016: ALT 18; Magnesium 2.2; TSH 3.595 09/26/2016: BUN 47; Creatinine, Ser 1.35; Potassium 4.0; Sodium 145 05/23/2017: HGB 11.8; Platelets 57   Lipid Panel No results found for: CHOL, TRIG, HDL, CHOLHDL, VLDL, LDLCALC, LDLDIRECT  Additional studies/ records that were reviewed today include:    Abdominal US of aorta 02/2017 AAA with no increase in size, 3.3 cm; right CIA aneurysm stable at 2.6 cm.  ABI 05/27/2016 R 1.05 L 0.98  ASSESSMENT & PLAN:    1. Persistent atrial fibrillation - rate control. Continue beta blocker. No dizziness or syncope. DOE that likely due to age. No LE edema. Not on anticoagulation due to prior GI bleed and thrombocytopenia.   2. HTN - BP well controlled on current therapy.  3. Thombocytopenia  - follow by Dr. Lindi Adie. Platelets 57 on 05/23/17.   4. Asymptomatic AAA and right CIA aneurysm  - Recent stable showed stable disease -Followed by Dr. Bridgett Larsson    Medication Adjustments/Labs and Tests Ordered: Current medicines are reviewed at length with the patient today.  Concerns regarding medicines are outlined above.  Medication changes, Labs and Tests ordered today are listed in the Patient Instructions below. Patient Instructions  Medication Instructions: Your physician recommends that you continue on your current medications as directed. Please refer to the Current Medication list given to you today.  Labwork: None Ordered  Procedures/Testing: None Ordered  Follow-Up: Your physician wants you to follow-up in: 6 MONTHS with Dr. Acie Fredrickson. You will receive a reminder letter in the mail two months in advance. If you  don't receive a letter, please call our office to schedule the follow-up appointment.   If you need a refill on your cardiac medications before your next appointment, please call your pharmacy.      Jarrett Soho, PA  06/12/2017 11:00 AM    [facility] [address], [place], [place]  [address] Phone: [phone]; Fax: [phone]

## 2017-06-12 ENCOUNTER — Ambulatory Visit (INDEPENDENT_AMBULATORY_CARE_PROVIDER_SITE_OTHER): Payer: Medicare Other | Admitting: Physician Assistant

## 2017-06-12 ENCOUNTER — Encounter: Payer: Self-pay | Admitting: Physician Assistant

## 2017-06-12 VITALS — BP 118/50 | HR 50 | Ht 69.0 in | Wt 171.1 lb

## 2017-06-12 DIAGNOSIS — I714 Abdominal aortic aneurysm, without rupture, unspecified: Secondary | ICD-10-CM

## 2017-06-12 DIAGNOSIS — I7409 Other arterial embolism and thrombosis of abdominal aorta: Secondary | ICD-10-CM | POA: Diagnosis not present

## 2017-06-12 DIAGNOSIS — I10 Essential (primary) hypertension: Secondary | ICD-10-CM

## 2017-06-12 DIAGNOSIS — I4819 Other persistent atrial fibrillation: Secondary | ICD-10-CM

## 2017-06-12 DIAGNOSIS — I481 Persistent atrial fibrillation: Secondary | ICD-10-CM

## 2017-06-12 NOTE — Patient Instructions (Signed)
Medication Instructions: Your physician recommends that you continue on your current medications as directed. Please refer to the Current Medication list given to you today.  Labwork: None Ordered  Procedures/Testing: None Ordered  Follow-Up: Your physician wants you to follow-up in: 6 MONTHS with Dr. Acie Fredrickson. You will receive a reminder letter in the mail two months in advance. If you don't receive a letter, please call our office to schedule the follow-up appointment.   If you need a refill on your cardiac medications before your next appointment, please call your pharmacy.

## 2017-08-18 ENCOUNTER — Ambulatory Visit (INDEPENDENT_AMBULATORY_CARE_PROVIDER_SITE_OTHER): Payer: Medicare Other | Admitting: Podiatry

## 2017-08-18 ENCOUNTER — Encounter: Payer: Self-pay | Admitting: Podiatry

## 2017-08-18 DIAGNOSIS — B351 Tinea unguium: Secondary | ICD-10-CM

## 2017-08-18 DIAGNOSIS — M79676 Pain in unspecified toe(s): Secondary | ICD-10-CM

## 2017-08-18 NOTE — Progress Notes (Signed)
Patient ID: Patrick [name], male   DOB: December 09, 1920, 83 y.o.   MRN: [id number] Complaint:  Visit Type: Patient returns to my office for continued preventative foot care services. Complaint: Patient states" my nails have grown long and thick and become painful to walk and wear shoes" . The patient presents for preventative foot care services. No changes to ROS  Podiatric Exam: Vascular: dorsalis pedis and posterior tibial pulses are not  Palpable due to his unna boots  bilateral. Capillary return is immediate. Temperature gradient is WNL. Skin turgor WNL .  Purplish discoloration both feet. Sensorium: Normal Semmes Weinstein monofilament test. Normal tactile sensation bilaterally. Nail Exam: Pt has thick disfigured discolored nails with subungual debris noted bilateral entire nail hallux through fifth toenails Ulcer Exam: There is no evidence of ulcer or pre-ulcerative changes or infection. Orthopedic Exam: Muscle tone and strength are WNL. No limitations in general ROM. No crepitus or effusions noted. Foot type and digits show no abnormalities. Bony prominences are unremarkable. Skin: No Porokeratosis. No infection or ulcers  Diagnosis:  Onychomycosis, , Pain in right toe, pain in left toes  Treatment & Plan Procedures and Treatment: Consent by patient was obtained for treatment procedures. The patient understood the discussion of treatment and procedures well. All questions were answered thoroughly reviewed. Debridement of mycotic and hypertrophic toenails, 1 through 5 bilateral and clearing of subungual debris. No ulceration, no infection noted.  Return Visit-Office Procedure: Patient instructed to return to the office for a follow up visit 3 months  for continued evaluation and treatment.   Gardiner Barefoot DPM

## 2017-08-22 NOTE — Assessment & Plan Note (Signed)
First noticed January 2018 with platelet count of 101 During that hospitalization it had decreased to 70s and 80s.  Most recent blood work  11/21/2016: Platelets 77 11/22/2016: Platelets 53 02/20/17: platelets 65; Hb 12.8 05/23/2017: Platelets 57  Differential diagnosis: 1. Increased consumption due to recent GI blood loss from angiodysplasia of the duodenum versus decreased production 2. since the remainder of the CBC is relatively normal, isolated thrombocytopenia raises the differential diagnosis of ITP as well. 3. With macrocytosis and mild anemia, myelodysplastic syndrome cannot be ruled out.  Given his advanced age and not planning on doing a bone marrow biopsy unless the platelets drop below 50.  RTC 3 months with labs

## 2017-08-23 ENCOUNTER — Other Ambulatory Visit (HOSPITAL_BASED_OUTPATIENT_CLINIC_OR_DEPARTMENT_OTHER): Payer: Medicare Other

## 2017-08-23 ENCOUNTER — Ambulatory Visit (HOSPITAL_BASED_OUTPATIENT_CLINIC_OR_DEPARTMENT_OTHER): Payer: Medicare Other | Admitting: Hematology and Oncology

## 2017-08-23 DIAGNOSIS — D696 Thrombocytopenia, unspecified: Secondary | ICD-10-CM

## 2017-08-23 LAB — CBC WITH DIFFERENTIAL/PLATELET
BASO%: 1.3 % (ref 0.0–2.0)
Basophils Absolute: 0.1 10*3/uL (ref 0.0–0.1)
EOS ABS: 0.3 10*3/uL (ref 0.0–0.5)
EOS%: 4 % (ref 0.0–7.0)
HCT: 40.5 % (ref 38.4–49.9)
HGB: 13.1 g/dL (ref 13.0–17.1)
LYMPH%: 12.7 % — AB (ref 14.0–49.0)
MCH: 32.7 pg (ref 27.2–33.4)
MCHC: 32.4 g/dL (ref 32.0–36.0)
MCV: 101 fL — AB (ref 79.3–98.0)
MONO#: 0.9 10*3/uL (ref 0.1–0.9)
MONO%: 12.8 % (ref 0.0–14.0)
NEUT#: 4.6 10*3/uL (ref 1.5–6.5)
NEUT%: 69.2 % (ref 39.0–75.0)
Platelets: 63 10*3/uL — ABNORMAL LOW (ref 140–400)
RBC: 4.01 10*6/uL — AB (ref 4.20–5.82)
RDW: 16 % — ABNORMAL HIGH (ref 11.0–14.6)
WBC: 6.7 10*3/uL (ref 4.0–10.3)
lymph#: 0.8 10*3/uL — ABNORMAL LOW (ref 0.9–3.3)

## 2017-08-23 NOTE — Progress Notes (Signed)
Patient Care Team: Crist Infante, MD as PCP - General (Internal Medicine) Nahser, Wonda Cheng, MD as Consulting Physician (Cardiology) Ladene Artist, MD as Consulting Physician (Gastroenterology) Gardiner Barefoot, DPM as Consulting Physician (Podiatry)  DIAGNOSIS:  Encounter Diagnosis  Name Primary?  . Thrombocytopenia ([place])     CHIEF COMPLIANT: Follow-up of thrombocytopenia  INTERVAL HISTORY: Patrick [name] is a 83 year old with above-mentioned history of thrombocytopenia who is here for 79-monthfollow-up.  He has not had any excessive bruising or bleeding. He denies any nosebleeds or gum bleeds.  REVIEW OF SYSTEMS:   Constitutional: Denies fevers, chills or abnormal weight loss Eyes: Denies blurriness of vision Ears, nose, mouth, throat, and face: Denies mucositis or sore throat Respiratory: Denies cough, dyspnea or wheezes Cardiovascular: Denies palpitation, chest discomfort Gastrointestinal:  Denies nausea, heartburn or change in bowel habits Skin: Denies abnormal skin rashes Lymphatics: Denies new lymphadenopathy or easy bruising Neurological:Denies numbness, tingling or new weaknesses Behavioral/Psych: Mood is stable, no new changes  Extremities: No lower extremity edema  All other systems were reviewed with the patient and are negative.  I have reviewed the past medical history, past surgical history, social history and family history with the patient and they are unchanged from previous note.  ALLERGIES:  has No Known Allergies.  MEDICATIONS:  Current Outpatient Medications  Medication Sig Dispense Refill  . atorvastatin (LIPITOR) 80 MG tablet Take 80 mg by mouth daily.      . ferrous sulfate 325 (65 FE) MG tablet Take 1 tablet (325 mg total) by mouth 3 (three) times daily with meals. 90 tablet 0  . furosemide (LASIX) 80 MG tablet Take 80 mg by mouth daily.    .Marland Kitchenlisinopril (PRINIVIL,ZESTRIL) 10 MG tablet Take 5 mg by mouth daily.     . metoprolol tartrate (LOPRESSOR)  25 MG tablet Take 25 mg by mouth 2 (two) times daily.   0  . Omega-3 Fatty Acids (FISH OIL) 1000 MG CAPS Take 1,000 mg by mouth daily.     .Vladimir FasterGlycol-Propyl Glycol (SYSTANE OP) Apply 1 drop to eye 2 (two) times daily as needed (dry eyes).      No current facility-administered medications for this visit.     PHYSICAL EXAMINATION: ECOG PERFORMANCE STATUS: 1 - Symptomatic but completely ambulatory  Vitals:   08/23/17 0844  BP: (!) 116/57  Pulse: 65  Resp: 18  Temp: 97.6 F (36.4 C)  SpO2: 100%   Filed Weights   08/23/17 0844  Weight: 159 lb 4.8 oz (72.3 kg)    GENERAL:alert, no distress and comfortable SKIN: skin color, texture, turgor are normal, no rashes or significant lesions EYES: normal, Conjunctiva are pink and non-injected, sclera clear OROPHARYNX:no exudate, no erythema and lips, buccal mucosa, and tongue normal  NECK: supple, thyroid normal size, non-tender, without nodularity LYMPH:  no palpable lymphadenopathy in the cervical, axillary or inguinal LUNGS: clear to auscultation and percussion with normal breathing effort HEART: regular rate & rhythm and no murmurs and no lower extremity edema ABDOMEN:abdomen soft, non-tender and normal bowel sounds MUSCULOSKELETAL:no cyanosis of digits and no clubbing  NEURO: alert & oriented x 3 with fluent speech, no focal motor/sensory deficits EXTREMITIES: No lower extremity edema  LABORATORY DATA:  I have reviewed the data as listed   Chemistry      Component Value Date/Time   NA 145 09/26/2016 0524   K 4.0 09/26/2016 0524   CL 111 09/26/2016 0524   CO2 25 09/26/2016 0524   BUN 47 (  H) 09/26/2016 0524   CREATININE 1.35 (H) 09/26/2016 0524   CREATININE 1.27 (H) 11/03/2015 1057      Component Value Date/Time   CALCIUM 8.6 (L) 09/26/2016 0524   ALKPHOS 121 09/24/2016 0645   AST 27 09/24/2016 0645   ALT 18 09/24/2016 0645   BILITOT 1.6 (H) 09/24/2016 0645       Lab Results  Component Value Date   WBC 6.7  08/23/2017   HGB 13.1 08/23/2017   HCT 40.5 08/23/2017   MCV 101.0 (H) 08/23/2017   PLT 63 (L) 08/23/2017   NEUTROABS 4.6 08/23/2017    ASSESSMENT & PLAN:  Thrombocytopenia ([place]) First noticed January 2018 with platelet count of 101 During that hospitalization it had decreased to 70s and 80s.  Most recent blood work  11/21/2016: Platelets 77 11/22/2016: Platelets 53 02/20/17: platelets 65; Hb 12.8 05/23/2017: Platelets 57 08/23/17: Platelets 63, hemoglobin 13.1, patient takes oral iron daily given prior history of GI bleeding.  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 Given his advanced age and not planning on doing a bone marrow biopsy unless the platelets drop below 50.  RTC 6 months with labs or sooner if he has any excessive bruising or bleeding symptoms  I spent 25 minutes talking to the patient of which more than half was spent in counseling and coordination of care.  Orders Placed This Encounter  Procedures  . CBC with Differential    Standing Status:   Future    Standing Expiration Date:   08/23/2018   The patient has a good understanding of the overall plan. he agrees with it. he will call with any problems that may develop before the next visit here.   Harriette Ohara, MD 08/23/17

## 2017-09-25 ENCOUNTER — Other Ambulatory Visit: Payer: Self-pay | Admitting: Internal Medicine

## 2017-09-25 DIAGNOSIS — R5381 Other malaise: Secondary | ICD-10-CM

## 2017-09-25 DIAGNOSIS — S32009A Unspecified fracture of unspecified lumbar vertebra, initial encounter for closed fracture: Secondary | ICD-10-CM

## 2017-09-27 ENCOUNTER — Ambulatory Visit
Admission: RE | Admit: 2017-09-27 | Discharge: 2017-09-27 | Disposition: A | Discharge status: Home / Self Care | Payer: Medicare Other | Source: Ambulatory Visit | Attending: Internal Medicine | Admitting: Internal Medicine

## 2017-09-27 DIAGNOSIS — S32009A Unspecified fracture of unspecified lumbar vertebra, initial encounter for closed fracture: Secondary | ICD-10-CM

## 2017-11-17 ENCOUNTER — Encounter: Payer: Self-pay | Admitting: Podiatry

## 2017-11-17 ENCOUNTER — Ambulatory Visit (INDEPENDENT_AMBULATORY_CARE_PROVIDER_SITE_OTHER): Payer: Medicare Other | Admitting: Podiatry

## 2017-11-17 DIAGNOSIS — M79676 Pain in unspecified toe(s): Secondary | ICD-10-CM

## 2017-11-17 DIAGNOSIS — B351 Tinea unguium: Secondary | ICD-10-CM | POA: Diagnosis not present

## 2017-11-17 NOTE — Progress Notes (Signed)
Patient ID: MALIKAI GUT, male   DOB: [birth year], 84 y.o.   MRN: [id number] Complaint:  Visit Type: Patient returns to my office for continued preventative foot care services. Complaint: Patient states" my nails have grown long and thick and become painful to walk and wear shoes" . The patient presents for preventative foot care services. No changes to ROS.  Patient presents with an unna boot on both legs/feet.  Podiatric Exam: Vascular: deferred due to unna boots. Sensorium: deferred due to unna boots. Nail Exam: Pt has thick disfigured discolored nails with subungual debris noted bilateral entire nail hallux through fifth toenails Ulcer Exam: There is no evidence of ulcer or pre-ulcerative changes or infection. Orthopedic Exam: Muscle tone and strength are WNL. No limitations in general ROM. No crepitus or effusions noted. Foot type and digits show no abnormalities. Bony prominences are unremarkable. Skin: No Porokeratosis. No infection or ulcers  Diagnosis:  Onychomycosis, , Pain in right toe, pain in left toes  Treatment & Plan Procedures and Treatment: Consent by patient was obtained for treatment procedures. The patient understood the discussion of treatment and procedures well. All questions were answered thoroughly reviewed. Debridement of mycotic and hypertrophic toenails, 1 through 5 bilateral and clearing of subungual debris. No ulceration, no infection noted.  Return Visit-Office Procedure: Patient instructed to return to the office for a follow up visit 3 months  for continued evaluation and treatment.   Gardiner Barefoot DPM

## 2017-12-26 ENCOUNTER — Emergency Department (HOSPITAL_BASED_OUTPATIENT_CLINIC_OR_DEPARTMENT_OTHER)
Admit: 2017-12-26 | Discharge: 2017-12-26 | Disposition: A | Discharge status: Home / Self Care | Payer: Medicare Other | Attending: Emergency Medicine | Admitting: Emergency Medicine

## 2017-12-26 ENCOUNTER — Encounter (HOSPITAL_COMMUNITY): Payer: Self-pay

## 2017-12-26 ENCOUNTER — Other Ambulatory Visit: Payer: Self-pay

## 2017-12-26 ENCOUNTER — Emergency Department (HOSPITAL_COMMUNITY): Payer: Medicare Other

## 2017-12-26 ENCOUNTER — Inpatient Hospital Stay (HOSPITAL_COMMUNITY)
Admission: EM | Admit: 2017-12-26 | Discharge: 2017-12-30 | DRG: 291 | Disposition: A | Discharge status: Skilled Nursing Facility | Payer: Medicare Other | Attending: Internal Medicine | Admitting: Internal Medicine

## 2017-12-26 DIAGNOSIS — I50811 Acute right heart failure: Secondary | ICD-10-CM | POA: Diagnosis present

## 2017-12-26 DIAGNOSIS — Z9981 Dependence on supplemental oxygen: Secondary | ICD-10-CM

## 2017-12-26 DIAGNOSIS — Z87891 Personal history of nicotine dependence: Secondary | ICD-10-CM | POA: Diagnosis not present

## 2017-12-26 DIAGNOSIS — L8990 Pressure ulcer of unspecified site, unspecified stage: Secondary | ICD-10-CM

## 2017-12-26 DIAGNOSIS — R600 Localized edema: Secondary | ICD-10-CM | POA: Diagnosis not present

## 2017-12-26 DIAGNOSIS — N182 Chronic kidney disease, stage 2 (mild): Secondary | ICD-10-CM | POA: Diagnosis present

## 2017-12-26 DIAGNOSIS — L89152 Pressure ulcer of sacral region, stage 2: Secondary | ICD-10-CM | POA: Diagnosis present

## 2017-12-26 DIAGNOSIS — Z66 Do not resuscitate: Secondary | ICD-10-CM | POA: Diagnosis present

## 2017-12-26 DIAGNOSIS — I130 Hypertensive heart and chronic kidney disease with heart failure and stage 1 through stage 4 chronic kidney disease, or unspecified chronic kidney disease: Principal | ICD-10-CM | POA: Diagnosis present

## 2017-12-26 DIAGNOSIS — J90 Pleural effusion, not elsewhere classified: Secondary | ICD-10-CM | POA: Diagnosis not present

## 2017-12-26 DIAGNOSIS — I481 Persistent atrial fibrillation: Secondary | ICD-10-CM | POA: Diagnosis present

## 2017-12-26 DIAGNOSIS — I723 Aneurysm of iliac artery: Secondary | ICD-10-CM | POA: Diagnosis present

## 2017-12-26 DIAGNOSIS — D509 Iron deficiency anemia, unspecified: Secondary | ICD-10-CM | POA: Diagnosis present

## 2017-12-26 DIAGNOSIS — I4891 Unspecified atrial fibrillation: Secondary | ICD-10-CM | POA: Diagnosis present

## 2017-12-26 DIAGNOSIS — N179 Acute kidney failure, unspecified: Secondary | ICD-10-CM | POA: Diagnosis not present

## 2017-12-26 DIAGNOSIS — I509 Heart failure, unspecified: Secondary | ICD-10-CM | POA: Diagnosis not present

## 2017-12-26 DIAGNOSIS — R0603 Acute respiratory distress: Secondary | ICD-10-CM | POA: Diagnosis not present

## 2017-12-26 DIAGNOSIS — I714 Abdominal aortic aneurysm, without rupture: Secondary | ICD-10-CM | POA: Diagnosis present

## 2017-12-26 DIAGNOSIS — I5031 Acute diastolic (congestive) heart failure: Secondary | ICD-10-CM | POA: Diagnosis not present

## 2017-12-26 DIAGNOSIS — J9610 Chronic respiratory failure, unspecified whether with hypoxia or hypercapnia: Secondary | ICD-10-CM | POA: Diagnosis not present

## 2017-12-26 DIAGNOSIS — D696 Thrombocytopenia, unspecified: Secondary | ICD-10-CM | POA: Diagnosis present

## 2017-12-26 DIAGNOSIS — I10 Essential (primary) hypertension: Secondary | ICD-10-CM | POA: Diagnosis present

## 2017-12-26 DIAGNOSIS — I872 Venous insufficiency (chronic) (peripheral): Secondary | ICD-10-CM | POA: Diagnosis present

## 2017-12-26 DIAGNOSIS — R001 Bradycardia, unspecified: Secondary | ICD-10-CM | POA: Diagnosis not present

## 2017-12-26 DIAGNOSIS — N183 Chronic kidney disease, stage 3 (moderate): Secondary | ICD-10-CM | POA: Diagnosis present

## 2017-12-26 DIAGNOSIS — I5033 Acute on chronic diastolic (congestive) heart failure: Secondary | ICD-10-CM | POA: Diagnosis not present

## 2017-12-26 DIAGNOSIS — N170 Acute kidney failure with tubular necrosis: Secondary | ICD-10-CM | POA: Diagnosis not present

## 2017-12-26 DIAGNOSIS — R0600 Dyspnea, unspecified: Secondary | ICD-10-CM

## 2017-12-26 DIAGNOSIS — M7989 Other specified soft tissue disorders: Secondary | ICD-10-CM

## 2017-12-26 DIAGNOSIS — E876 Hypokalemia: Secondary | ICD-10-CM | POA: Diagnosis not present

## 2017-12-26 DIAGNOSIS — I361 Nonrheumatic tricuspid (valve) insufficiency: Secondary | ICD-10-CM | POA: Diagnosis not present

## 2017-12-26 DIAGNOSIS — E870 Hyperosmolality and hypernatremia: Secondary | ICD-10-CM | POA: Diagnosis not present

## 2017-12-26 DIAGNOSIS — N184 Chronic kidney disease, stage 4 (severe): Secondary | ICD-10-CM | POA: Diagnosis not present

## 2017-12-26 HISTORY — DX: Chronic kidney disease, stage 2 (mild): N18.2

## 2017-12-26 HISTORY — DX: Chronic respiratory failure, unspecified whether with hypoxia or hypercapnia: J96.10

## 2017-12-26 HISTORY — DX: Dyspnea, unspecified: R06.00

## 2017-12-26 HISTORY — DX: Localized edema: R60.0

## 2017-12-26 HISTORY — DX: Pleural effusion, not elsewhere classified: J90

## 2017-12-26 LAB — URINALYSIS, ROUTINE W REFLEX MICROSCOPIC
BACTERIA UA: NONE SEEN
Bilirubin Urine: NEGATIVE
Glucose, UA: NEGATIVE mg/dL
Hgb urine dipstick: NEGATIVE
Ketones, ur: NEGATIVE mg/dL
Leukocytes, UA: NEGATIVE
Nitrite: NEGATIVE
PROTEIN: NEGATIVE mg/dL
Specific Gravity, Urine: 1.008 (ref 1.005–1.030)
pH: 5 (ref 5.0–8.0)

## 2017-12-26 LAB — COMPREHENSIVE METABOLIC PANEL
ALBUMIN: 3 g/dL — AB (ref 3.5–5.0)
ALT: 20 U/L (ref 17–63)
AST: 63 U/L — AB (ref 15–41)
Alkaline Phosphatase: 201 U/L — ABNORMAL HIGH (ref 38–126)
Anion gap: 12 (ref 5–15)
BILIRUBIN TOTAL: 2.7 mg/dL — AB (ref 0.3–1.2)
BUN: 76 mg/dL — AB (ref 6–20)
CHLORIDE: 100 mmol/L — AB (ref 101–111)
CO2: 31 mmol/L (ref 22–32)
Calcium: 9.3 mg/dL (ref 8.9–10.3)
Creatinine, Ser: 2.26 mg/dL — ABNORMAL HIGH (ref 0.61–1.24)
GFR calc Af Amer: 27 mL/min — ABNORMAL LOW (ref >60)
GFR calc non Af Amer: 23 mL/min — ABNORMAL LOW (ref >60)
GLUCOSE: 142 mg/dL — AB (ref 65–99)
POTASSIUM: 5.2 mmol/L — AB (ref 3.5–5.1)
Sodium: 143 mmol/L (ref 135–145)
Total Protein: 5.9 g/dL — ABNORMAL LOW (ref 6.5–8.1)

## 2017-12-26 LAB — BRAIN NATRIURETIC PEPTIDE: B NATRIURETIC PEPTIDE 5: 667.9 pg/mL — AB (ref 0.0–100.0)

## 2017-12-26 LAB — CBC WITH DIFFERENTIAL/PLATELET
BASOS ABS: 0.1 10*3/uL (ref 0.0–0.1)
BASOS PCT: 1 %
Eosinophils Absolute: 0.2 10*3/uL (ref 0.0–0.7)
Eosinophils Relative: 3 %
HEMATOCRIT: 44.4 % (ref 39.0–52.0)
Hemoglobin: 14.2 g/dL (ref 13.0–17.0)
Lymphocytes Relative: 8 %
Lymphs Abs: 0.7 10*3/uL (ref 0.7–4.0)
MCH: 33.1 pg (ref 26.0–34.0)
MCHC: 32 g/dL (ref 30.0–36.0)
MCV: 103.5 fL — ABNORMAL HIGH (ref 78.0–100.0)
MONO ABS: 1.2 10*3/uL — AB (ref 0.1–1.0)
Monocytes Relative: 14 %
NEUTROS ABS: 6.3 10*3/uL (ref 1.7–7.7)
NEUTROS PCT: 74 %
PLATELETS: 80 10*3/uL — AB (ref 150–400)
RBC: 4.29 MIL/uL (ref 4.22–5.81)
RDW: 18.4 % — AB (ref 11.5–15.5)
WBC: 8.4 10*3/uL (ref 4.0–10.5)

## 2017-12-26 LAB — TROPONIN I
Troponin I: <0.03 ng/mL (ref <0.03)
Troponin I: 0.03 ng/mL (ref <0.03)

## 2017-12-26 LAB — TSH: TSH: 16.95 u[IU]/mL — AB (ref 0.350–4.500)

## 2017-12-26 LAB — I-STAT TROPONIN, ED: Troponin i, poc: 0.02 ng/mL (ref 0.00–0.08)

## 2017-12-26 MED ORDER — ABALOPARATIDE 3120 MCG/1.56ML ~~LOC~~ SOPN: 80.0000 ug | PEN_INJECTOR | Freq: Every day | SUBCUTANEOUS | Status: DC

## 2017-12-26 MED ORDER — ONDANSETRON HCL 4 MG/2ML IJ SOLN: 4.0000 mg | Freq: Four times a day (QID) | INTRAMUSCULAR | Status: DC | PRN

## 2017-12-26 MED ORDER — SODIUM CHLORIDE 0.9% FLUSH: 3.0000 mL | INTRAVENOUS | Status: DC | PRN

## 2017-12-26 MED ORDER — ACETAMINOPHEN 325 MG PO TABS
650.0000 mg | ORAL_TABLET | ORAL | Status: DC | PRN
  Administered 2017-12-29: 650 mg via ORAL
  Filled 2017-12-26: qty 2

## 2017-12-26 MED ORDER — FERROUS SULFATE 325 (65 FE) MG PO TABS
325.0000 mg | ORAL_TABLET | Freq: Every day | ORAL | Status: DC
  Administered 2017-12-27 – 2017-12-30 (×4): 325 mg via ORAL
  Filled 2017-12-26 (×4): qty 1

## 2017-12-26 MED ORDER — SODIUM CHLORIDE 0.9 % IV SOLN: 250.0000 mL | INTRAVENOUS | Status: DC | PRN

## 2017-12-26 MED ORDER — FUROSEMIDE 10 MG/ML IJ SOLN
60.0000 mg | Freq: Two times a day (BID) | INTRAMUSCULAR | Status: DC
  Administered 2017-12-26: 60 mg via INTRAVENOUS
  Filled 2017-12-26 (×2): qty 6

## 2017-12-26 MED ORDER — FUROSEMIDE 10 MG/ML IJ SOLN
60.0000 mg | Freq: Once | INTRAMUSCULAR | Status: AC
  Administered 2017-12-26: 60 mg via INTRAVENOUS
  Filled 2017-12-26: qty 6

## 2017-12-26 MED ORDER — SODIUM CHLORIDE 0.9% FLUSH
3.0000 mL | Freq: Two times a day (BID) | INTRAVENOUS | Status: DC
  Administered 2017-12-27 – 2017-12-29 (×5): 3 mL via INTRAVENOUS

## 2017-12-26 NOTE — ED Provider Notes (Addendum)
[facility] EMERGENCY DEPARTMENT Provider Note   CSN: [id number] Arrival date & time: 12/26/17  1441     History   Chief Complaint Chief Complaint  Patient presents with  . Congestive Heart Failure    HPI Patrick [name] is a 84 y.o. male.  HPI Patrick [name] is a 84 y.o. male with history of abdominal aortic aneurysm, atrial fibrillation, currently does not appear to be in the any anticoagulation, hypertension, possible ITP with thrombocytopenia, CHF, presents to emergency department with complaint of weakness, shortness of breath, left leg swelling.  Patient is coming from assisted living facility.  He is accompanied by his wife is providing most of the history.  She states he has been sleeping a lot lately, he has decreased appetite.  She states that her nursing home they were concerned about his vital signs and that is why they transported here.  She states that she feels like his abdomen is more distended than usual as well as left leg is more swollen than normal.  Patient states that he just feels "blah."  They deny any fevers.  Denies any nausea or vomiting.  No chest pain.  No cough.  No abdominal pain.  No dysuria or hematuria.  No other complaints.  Patient is a poor historian.  Past Medical History:  Diagnosis Date  . AAA (abdominal aortic aneurysm) ([place])   . Adenomatous colon polyp 2004   TVA 04/2006  . Arthritis   . Atrial fibrillation ([place])   . Diverticulosis   . Hemorrhoids   . Hypercholesterolemia   . Hypertension     Patient Active Problem List   Diagnosis Date Noted  . Unilateral primary osteoarthritis, left knee 03/16/2017  . Unilateral primary osteoarthritis, right knee 03/16/2017  . Thrombocytopenia ([place]) 02/20/2017  . Chronic pain of both knees 02/15/2017  . Effusion of both knee joints 02/15/2017  . Angiodysplasia of duodenum   . Melena 09/23/2016  . Blood loss anemia 09/23/2016  . Hyperkalemia 09/23/2016  . HTN (hypertension)  08/08/2014  . AAA (abdominal aortic aneurysm) ([place]) 05/03/2013  . 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 Abdominal aneurysm without mention of rupture 08/06/2012  . Atrial fibrillation ([place]) 06/15/2009  . PERSONAL HISTORY OF COLONIC POLYPS 06/15/2009    Past Surgical History:  Procedure Laterality Date  . ESOPHAGOGASTRODUODENOSCOPY N/A 09/25/2016   Procedure: ESOPHAGOGASTRODUODENOSCOPY (EGD);  Surgeon: Irene Shipper, MD;  Location: [facility];  Service: Endoscopy;  Laterality: N/A;  . HERNIA REPAIR  Age 19  . TONSILLECTOMY     Very Young        Home Medications    Prior to Admission medications   Medication Sig Start Date End Date Taking? Authorizing Provider  atorvastatin (LIPITOR) 80 MG tablet Take 80 mg by mouth daily.      [provider]  ferrous sulfate 325 (65 FE) MG tablet Take 1 tablet (325 mg total) by mouth 3 (three) times daily with meals. 09/26/16   Debbe Odea, MD  furosemide (LASIX) 80 MG tablet Take 80 mg by mouth daily.    [provider]  lisinopril (PRINIVIL,ZESTRIL) 10 MG tablet Take 5 mg by mouth daily.     [provider]  metoprolol tartrate (LOPRESSOR) 25 MG tablet Take 25 mg by mouth 2 (two) times daily.  03/04/16   [provider]  Omega-3 Fatty Acids (FISH OIL) 1000 MG CAPS Take 1,000 mg by mouth daily.     [provider]  Polyethyl  Glycol-Propyl Glycol (SYSTANE OP) Apply 1 drop to eye 2 (two) times daily as needed (dry eyes).     [provider]    Family History Family History  Problem Relation Age of Onset  . Emphysema Father   . Diabetes Neg Hx   . Cancer Neg Hx     Social History Social History   Tobacco Use  . Smoking status: Former Smoker    Types: Cigarettes    Last attempt to quit: 08/29/1969    Years since quitting: 48.3  . Smokeless tobacco: Never Used  Substance Use Topics  . Alcohol use: Yes    Comment: glass of wine 1-2 times per week  . Drug use: No      Allergies   Patient has no known allergies.   Review of Systems Review of Systems  Constitutional: Positive for appetite change and fatigue. Negative for chills and fever.  Respiratory: Positive for shortness of breath. Negative for cough and chest tightness.   Cardiovascular: Positive for leg swelling. Negative for chest pain and palpitations.  Gastrointestinal: Negative for abdominal distention, abdominal pain, diarrhea, nausea and vomiting.  Genitourinary: Negative for dysuria, frequency, hematuria and urgency.  Musculoskeletal: Positive for arthralgias. Negative for myalgias, neck pain and neck stiffness.  Skin: Negative for rash.  Allergic/Immunologic: Negative for immunocompromised state.  Neurological: Positive for weakness. Negative for dizziness, light-headedness, numbness and headaches.  All other systems reviewed and are negative.    Physical Exam Updated Vital Signs BP 111/60 (BP Location: Right Arm)   Pulse (!) 54   Temp (!) 95.1 F (35.1 C) (Rectal)   Resp (!) 25   SpO2 100%   Physical Exam  Constitutional: He appears well-developed and well-nourished. No distress.  HENT:  Head: Normocephalic and atraumatic.  Eyes: Conjunctivae are normal.  Neck: Neck supple.  Cardiovascular: Normal rate, regular rhythm and normal heart sounds.  Pulmonary/Chest: Effort normal. He has no wheezes. He has no rales.  Decreased air movement bilaterally  Abdominal: Soft. Bowel sounds are normal. He exhibits distension. There is no tenderness. There is no rebound.  Musculoskeletal: He exhibits no edema.  Bilateral lower extremity edema, left much worse than right.  Right leg edema and chronic skin discoloration up to the knee.  Left leg is much more erythematous, much more swollen, 3+ pitting edema up to the groin.  Cap refill less than 2 seconds distally.  Neurological: He is alert.  Skin: Skin is warm and dry.  Nursing note and vitals reviewed.    ED Treatments / Results   Labs (all labs ordered are listed, but only abnormal results are displayed) Labs Reviewed  CBC WITH DIFFERENTIAL/PLATELET - Abnormal; Notable for the following components:      Result Value   MCV 103.5 (*)    RDW 18.4 (*)    Platelets 80 (*)    Monocytes Absolute 1.2 (*)    All other components within normal limits  COMPREHENSIVE METABOLIC PANEL - Abnormal; Notable for the following components:   Potassium 5.2 (*)    Chloride 100 (*)    Glucose, Bld 142 (*)    BUN 76 (*)    Creatinine, Ser 2.26 (*)    Total Protein 5.9 (*)    Albumin 3.0 (*)    AST 63 (*)    Alkaline Phosphatase 201 (*)    Total Bilirubin 2.7 (*)    GFR calc non Af Amer 23 (*)    GFR calc Af Amer 27 (*)  All other components within normal limits  BRAIN NATRIURETIC PEPTIDE - Abnormal; Notable for the following components:   B Natriuretic Peptide 667.9 (*)    All other components within normal limits  URINALYSIS, ROUTINE W REFLEX MICROSCOPIC  BASIC METABOLIC PANEL  TSH  TROPONIN I  TROPONIN I  TROPONIN I  I-STAT TROPONIN, ED    EKG EKG Interpretation  Date/Time:  Tuesday December 26 2017 14:48:11 EDT Ventricular Rate:  60 PR Interval:    QRS Duration: 154 QT Interval:  522 QTC Calculation: 522 R Axis:   82 Text Interpretation:  Atrial fibrillation IVCD, consider atypical LBBB Confirmed by Virgel Manifold [phone]) on 12/26/2017 4:17:08 PM   Radiology Dg Chest 2 View  Result Date: 12/26/2017 CLINICAL DATA:  Left leg swelling EXAM: CHEST - 2 VIEW COMPARISON:  None. FINDINGS: Cardiopericardial enlargement and haziness of the bilateral chest from layering pleural fluid that is small to moderate. There is a dense opacity at the medial right base, probable atelectasis given setting and margins. No mass was seen at the right base on a 09/28/2017 lumbar spine radiograph. Vascular pedicle widening and cephalized blood flow. IMPRESSION: Cardiopericardial enlargement, vascular congestion, and pleural effusions  obscuring much of the lower lungs. Electronically Signed   By: Monte Fantasia M.D.   On: 12/26/2017 16:24    Procedures Procedures (including critical care time) CRITICAL CARE Performed by: Aleina Burgio Total critical care time: 30 minutes Critical care time was exclusive of separately billable procedures and treating other patients. Critical care was necessary to treat or prevent imminent or life-threatening deterioration. Critical care was time spent personally by me on the following activities: development of treatment plan with patient and/or surrogate as well as nursing, discussions with consultants, evaluation of patient's response to treatment, examination of patient, obtaining history from patient or surrogate, ordering and performing treatments and interventions, ordering and review of laboratory studies, ordering and review of radiographic studies, pulse oximetry and re-evaluation of patient's condition.  Medications Ordered in ED Medications  Abaloparatide SOPN 80 mcg (has no administration in time range)  ferrous sulfate tablet 325 mg (has no administration in time range)  sodium chloride flush (NS) 0.9 % injection 3 mL (has no administration in time range)  sodium chloride flush (NS) 0.9 % injection 3 mL (has no administration in time range)  0.9 %  sodium chloride infusion (has no administration in time range)  acetaminophen (TYLENOL) tablet 650 mg (has no administration in time range)  ondansetron (ZOFRAN) injection 4 mg (has no administration in time range)  furosemide (LASIX) injection 60 mg (has no administration in time range)  furosemide (LASIX) injection 60 mg (60 mg Intravenous Given 12/26/17 1638)     Initial Impression / Assessment and Plan / ED Course  I have reviewed the triage vital signs and the nursing notes.  Pertinent labs & imaging results that were available during my care of the patient were reviewed by me and considered in my medical decision  making (see chart for details).     Patient to the emergency room generalized weakness, apparently shortness of breath at the nursing home facility, currently no respiratory distress.  He is on oxygen at all times, and currently oxygen saturation 100% on his regular 2 L.  Patient does have pretty significant swelling to the left lower leg, I am unsure how acute this is, will get venous duplex to rule out a blood clot.  He is not currently anticoagulated and is in A. Fib, mildly bradycardic.  Blood pressure is normal.  Rectal temperature is 95.1, placed on bear hugger.  Will check labs and chest x-ray.  5:29 PM CXR with vascular congestion and pleural effusions.  BNP elevated.  Creatinine of is elevated up to 2.26 with BUN of 76, we do not have recent blood work in epic to compare to.  Although patient is hypothermic, there is no other vital sign findings, no elevation white blood cell count, no symptoms to suggest sepsis.  We will recheck his rectal temperature.  I did order 60 of Lasix for his fluid retention.  His venous duplex of the left leg is negative for DVT.  Also noted that pt's HR dropping into 30s, pt is in atrial fibrillation, on metoprolol, watching closely. Pt asymptomatic at this time, but perhaps could explain his increased generalized weakness.   Discussed with Dr. Wilson Singer who has evaluated pt as well. Agrees for admission.   Spoke with medicine, will admit.   Vitals:   12/26/17 1545 12/26/17 1615 12/26/17 1630 12/26/17 1645  BP: (!) 106/46 (!) 106/58 (!) 111/49 (!) 107/57  Pulse: (!) 34  (!) 48 (!) 37  Resp: (!) 22  18 (!) 23  Temp:      TempSrc:      SpO2: 93%  98% 99%    Final Clinical Impressions(s) / ED Diagnoses   Final diagnoses:  Dyspnea, unspecified type  Congestive heart failure, unspecified HF chronicity, unspecified heart failure type [facility])  Atrial fibrillation with slow ventricular response [facility])    ED Discharge Orders    None       Jeannett Senior, PA-C 12/26/17 1733    Virgel Manifold, MD 12/28/17 2254    Jeannett Senior, PA-C 01/21/18 3016    Virgel Manifold, MD 01/24/18 1227

## 2017-12-26 NOTE — ED Notes (Signed)
ED Provider at bedside. 

## 2017-12-26 NOTE — H&P (Signed)
History and Physical    Patrick [name] OVZ:[id number] DOB: July 28, 1921 DOA: 12/26/2017  PCP: Patrick Infante, MD [facility]  Chief Complaint: sob/LE edema  HPI: Patrick [name] is a 84 y.o. male with medical history significant  For A. Fib no longer on Coumadin likely elated to thrombocytopenia, abdominal aortic aneurysm, hypertension, arthritis, colon polyps, diverticulosis, anemia, chronic kidney disease, chronic respiratory failure, chronic heart failure presents to the emergency Department chief complaint worsening shortness of breath and worsening lower extremity edema. Initial evaluation reveals acute on chronic heart failure and bradycardia. [facility] are asked to admit  Information is obtained from the chart and the family who is at the bedside. Patient has been on chronic oxygen 24/7 for "quite a while" and family is unclear as to why. They do remember him being diagnosed with heart failure couple of years ago. Today he was sent to the emergency department for evaluation of worsening shortness of breath and worsening lower extremity edema and abdominal edema. Family reports lower extremity edema is intermittent but the last several days have been this been persistent. They also report that this abdominal swelling "popped up" just yesterday. There is no report of any complaints of chest pain palpitations fever chills cough. No reports of any headache dizziness syncope or near-syncope. Family does indicate his appetite waxes and wanes in his by mouth intake has decreased. No reports of any nausea vomiting dysuria hematuria frequency or urgency. family unsure if he is compliant with his medications. Son reports he visited 3 days ago and patient seemed "out of it".   ED Course: in the emergency department e is hypothermic bradycardic tachypnea blood pressure low end of normal he's not hypoxic on his baseline 2 L. He is provided with 60 mg of Lasix  IV.  Review of Systems: As per HPI otherwise all other systems reviewed and are negative.   Ambulatory Status ambulates with a walker no recent falls  Past Medical History:  Diagnosis Date  . AAA (abdominal aortic aneurysm) ([place])   . Adenomatous colon polyp 2004   TVA 04/2006  . Arthritis   . Atrial fibrillation ([place])   . Chronic respiratory failure ([place])   . CKD (chronic kidney disease), stage II   . Diverticulosis   . Dyspnea   . Hemorrhoids   . Hypercholesterolemia   . Hypertension   . Lower extremity edema   . Pleural effusion     Past Surgical History:  Procedure Laterality Date  . ESOPHAGOGASTRODUODENOSCOPY N/A 09/25/2016   Procedure: ESOPHAGOGASTRODUODENOSCOPY (EGD);  Surgeon: Patrick Shipper, MD;  Location: [facility];  Service: Endoscopy;  Laterality: N/A;  . HERNIA REPAIR  Age 48  . TONSILLECTOMY     Very Young    Social History   Socioeconomic History  . Marital status: Divorced    Spouse name: Not on file  . Number of children: 3  . Years of education: Not on file  . Highest education level: Not on file  Occupational History  . Occupation: retired    Fish farm manager: RETIRED  Social Needs  . Financial resource strain: Not on file  . Food insecurity:    Worry: Not on file    Inability: Not on file  . Transportation needs:    Medical: Not on file    Non-medical: Not on file  Tobacco Use  . Smoking status: Former Smoker    Types: Cigarettes    Last attempt to quit: 08/29/1969  Years since quitting: 48.3  . Smokeless tobacco: Never Used  Substance and Sexual Activity  . Alcohol use: Yes    Comment: glass of wine 1-2 times per week  . Drug use: No  . Sexual activity: Not on file  Lifestyle  . Physical activity:    Days per week: Not on file    Minutes per session: Not on file  . Stress: Not on file  Relationships  . Social connections:    Talks on phone: Not on file    Gets together: Not on file    Attends religious service: Not on file    Active  member of club or organization: Not on file    Attends meetings of clubs or organizations: Not on file    Relationship status: Not on file  . Intimate partner violence:    Fear of current or ex partner: Not on file    Emotionally abused: Not on file    Physically abused: Not on file    Forced sexual activity: Not on file  Other Topics Concern  . Not on file  Social History Narrative  . Not on file    No Known Allergies  Family History  Problem Relation Age of Onset  . Emphysema Father   . Diabetes Neg Hx   . Cancer Neg Hx     Prior to Admission medications   Medication Sig Start Date End Date Taking? Authorizing Provider  Abaloparatide (TYMLOS) 3120 MCG/1.56ML SOPN Inject 80 mcg into the skin daily.   Yes [provider]  ferrous sulfate 325 (65 FE) MG tablet Take 1 tablet (325 mg total) by mouth 3 (three) times daily with meals. Patient taking differently: Take 325 mg by mouth daily with breakfast.  09/26/16  Yes Patrick [name], Eunice Blase, MD  furosemide (LASIX) 80 MG tablet Take 80 mg by mouth daily.   Yes [provider]  metolazone (ZAROXOLYN) 2.5 MG tablet Take 2.5 mg by mouth daily as needed (for weight gain of 3 pounds overnight or 5 pounds in a week).   Yes [provider]  metoprolol tartrate (LOPRESSOR) 25 MG tablet Take 12.5 mg by mouth 2 (two) times daily.  03/04/16  Yes [provider]    Physical Exam: Vitals:   12/26/17 1630 12/26/17 1645 12/26/17 1700 12/26/17 1730  BP: (!) 111/49 (!) 107/57 111/64 (!) 101/48  Pulse: (!) 48 (!) 37 (!) 47 (!) 43  Resp: 18 (!) 23 18 (!) 21  Temp:      TempSrc:      SpO2: 98% 99% 96% 95%     General:  Appears somewhat lethargic thin and frail chronically ill slightly pale Eyes:  PERRL, EOMI, normal lids, iris ENT:  grossly normal hearing, lips & tongue, mucous membranes of his mouth are slightly pale somewhat dry Neck:  no LAD, masses or thyromegaly Cardiovascular:  Bradycardic irregular, no m/r/g.  1-2+ LE edema greater than right. Chronic venous stasis changes Respiratory:  Mild increased work of breathing using abdominal accessory muscles breath sounds distant cracklesbilateral bases to mid lobes Abdomen:  Soft,mild distention sluggish bowel sounds no guarding or rebounding Skin:  no rash or induration seen on limited exam Musculoskeletal:  grossly normal tone BUE/BLE, good ROM, no bony abnormality Psychiatric:  grossly normal mood and affect, speech fluent and appropriate, AOx3 Neurologic:  CN 2-12 grossly intact, moves all extremities in coordinated fashion, sensation intact  Labs on Admission: I have personally reviewed following labs and imaging studies  CBC: Recent  Labs  Lab 12/26/17 1517  WBC 8.4  NEUTROABS 6.3  HGB 14.2  HCT 44.4  MCV 103.5*  PLT 80*   Basic Metabolic Panel: Recent Labs  Lab 12/26/17 1517  NA 143  K 5.2*  CL 100*  CO2 31  GLUCOSE 142*  BUN 76*  CREATININE 2.26*  CALCIUM 9.3   GFR: CrCl cannot be calculated (Unknown ideal weight.). Liver Function Tests: Recent Labs  Lab 12/26/17 1517  AST 63*  ALT 20  ALKPHOS 201*  BILITOT 2.7*  PROT 5.9*  ALBUMIN 3.0*   No results for input(s): LIPASE, AMYLASE in the last 168 hours. No results for input(s): AMMONIA in the last 168 hours. Coagulation Profile: No results for input(s): INR, PROTIME in the last 168 hours. Cardiac Enzymes: No results for input(s): CKTOTAL, CKMB, CKMBINDEX, TROPONINI in the last 168 hours. BNP (last 3 results) No results for input(s): PROBNP in the last 8760 hours. HbA1C: No results for input(s): HGBA1C in the last 72 hours. CBG: No results for input(s): GLUCAP in the last 168 hours. Lipid Profile: No results for input(s): CHOL, HDL, LDLCALC, TRIG, CHOLHDL, LDLDIRECT in the last 72 hours. Thyroid Function Tests: No results for input(s): TSH, T4TOTAL, FREET4, T3FREE, THYROIDAB in the last 72 hours. Anemia Panel: No results for input(s): VITAMINB12, FOLATE,  FERRITIN, TIBC, IRON, RETICCTPCT in the last 72 hours. Urine analysis:    Component Value Date/Time   COLORURINE YELLOW 12/26/2017 1534   APPEARANCEUR CLEAR 12/26/2017 1534   LABSPEC 1.008 12/26/2017 1534   PHURINE 5.0 12/26/2017 1534   GLUCOSEU NEGATIVE 12/26/2017 1534   HGBUR NEGATIVE 12/26/2017 1534   BILIRUBINUR NEGATIVE 12/26/2017 1534   KETONESUR NEGATIVE 12/26/2017 1534   PROTEINUR NEGATIVE 12/26/2017 1534   NITRITE NEGATIVE 12/26/2017 1534   LEUKOCYTESUR NEGATIVE 12/26/2017 1534    Creatinine Clearance: CrCl cannot be calculated (Unknown ideal weight.).  Sepsis Labs: @LABRCNTIP (procalcitonin:4,lacticidven:4) )No results found for this or any previous visit (from the past 240 hour(s)).   Radiological Exams on Admission: Dg Chest 2 View  Result Date: 12/26/2017 CLINICAL DATA:  Left leg swelling EXAM: CHEST - 2 VIEW COMPARISON:  None. FINDINGS: Cardiopericardial enlargement and haziness of the bilateral chest from layering pleural fluid that is small to moderate. There is a dense opacity at the medial right base, probable atelectasis given setting and margins. No mass was seen at the right base on a 09/28/2017 lumbar spine radiograph. Vascular pedicle widening and cephalized blood flow. IMPRESSION: Cardiopericardial enlargement, vascular congestion, and pleural effusions obscuring much of the lower lungs. Electronically Signed   By: Monte Fantasia M.D.   On: 12/26/2017 16:24    EKG: Atrial fibrillation IVCD, consider atypical LBBB  Assessment/Plan Principal Problem:   Acute heart failure (HCC) Active Problems:   Atrial fibrillation (HCC)   HTN (hypertension)   Thrombocytopenia (HCC)   CKD (chronic kidney disease), stage II   Bradycardia   #1. Acute heart failure. Be related to worsening kidney function in the setting of disease progression and pleural effusion. BNP elevated. Chest x-ray with vascular congestion and pleural effusion. Chart review indicates  echocardiogram 3 years ago revealed an EF of 55%. Family reports compliance with medication. Medications include Lasix and Zaroxolyn He received 60 mg of Lasix IV in the emergency department -Admit to telemetry -continue IV Lasix with parameters -Obtain a 2-D echo -continue oxygen supplementation -Monitor oxygen saturation level -Daily weights -Intake and output -We'll hold his home beta blocker related to bradycardia  #2. Atrial fibrillation/bradycardia. heart rate in  the 44 hours. Initial troponin negative. EKG as noted above. Patient was on Coumadin the past but they discontinued due to thrombocytopenia.home medications include metoprolol -Hold metoprolol -Obtain a TSH -monitor -patient dnr and not pacer candidate  #3. Chronic kidney disease. Creatinine 2.2 on admission. Chart review indicates this is above his baseline however data is 84 years old. -Hold nephrotoxins -Monitor urine output -recheck in the morning -We'll closely given the need for Lasix/diuresing  #4. Hypertension. Blood pressures and low end of normal. I medications include metoprolol. -Holding metoprolol for now -Monitor  #5. Thrombocytopenia. History of same. Followed by hematology. Chart review indicates would not consider bone marrow unless platelets fall below 50. Platelet count 80 at time of admission. No s/sx bleeding -monitor   DVT prophylaxis: scd  Code Status: dnr  Family Communication: son at bedside  [facility] Plan[facility]  Consults called: none  Admission status: inpatient    Radene Gunning MD [facility]  If 7PM-7AM, please contact night-coverage [email or web address] Password [facility]  12/26/2017, 5:42 PM

## 2017-12-26 NOTE — ED Triage Notes (Signed)
EMS- [facility] coming [facility] c/o increased shortness of breath and edema. Hx of CHF. Swelling in legs X3 days. No pain, n/v/d. Hx of afib. Takes lasix.

## 2017-12-26 NOTE — Progress Notes (Signed)
LLE venous duplex prelim: negative for DVT. Reeshemah Nazaryan Eunice, RDMS, RVT  

## 2017-12-26 NOTE — ED Notes (Signed)
Paged admitting in regards to pt temp and Bair Hugger.

## 2017-12-26 NOTE — ED Notes (Signed)
Attempted report X1

## 2017-12-26 NOTE — ED Notes (Signed)
Patient transported to vascular. 

## 2017-12-27 ENCOUNTER — Other Ambulatory Visit: Payer: Self-pay

## 2017-12-27 ENCOUNTER — Inpatient Hospital Stay (HOSPITAL_COMMUNITY): Payer: Medicare Other

## 2017-12-27 DIAGNOSIS — R0603 Acute respiratory distress: Secondary | ICD-10-CM

## 2017-12-27 DIAGNOSIS — R001 Bradycardia, unspecified: Secondary | ICD-10-CM

## 2017-12-27 DIAGNOSIS — I361 Nonrheumatic tricuspid (valve) insufficiency: Secondary | ICD-10-CM

## 2017-12-27 DIAGNOSIS — N182 Chronic kidney disease, stage 2 (mild): Secondary | ICD-10-CM

## 2017-12-27 DIAGNOSIS — I509 Heart failure, unspecified: Secondary | ICD-10-CM

## 2017-12-27 DIAGNOSIS — J90 Pleural effusion, not elsewhere classified: Secondary | ICD-10-CM

## 2017-12-27 DIAGNOSIS — L8990 Pressure ulcer of unspecified site, unspecified stage: Secondary | ICD-10-CM

## 2017-12-27 DIAGNOSIS — D696 Thrombocytopenia, unspecified: Secondary | ICD-10-CM

## 2017-12-27 DIAGNOSIS — I10 Essential (primary) hypertension: Secondary | ICD-10-CM

## 2017-12-27 LAB — BASIC METABOLIC PANEL
ANION GAP: 14 (ref 5–15)
BUN: 76 mg/dL — AB (ref 6–20)
CALCIUM: 9.3 mg/dL (ref 8.9–10.3)
CO2: 33 mmol/L — AB (ref 22–32)
Chloride: 99 mmol/L — ABNORMAL LOW (ref 101–111)
Creatinine, Ser: 2.16 mg/dL — ABNORMAL HIGH (ref 0.61–1.24)
GFR calc Af Amer: 28 mL/min — ABNORMAL LOW (ref >60)
GFR calc non Af Amer: 25 mL/min — ABNORMAL LOW (ref >60)
Glucose, Bld: 93 mg/dL (ref 65–99)
Potassium: 3.2 mmol/L — ABNORMAL LOW (ref 3.5–5.1)
Sodium: 146 mmol/L — ABNORMAL HIGH (ref 135–145)

## 2017-12-27 LAB — ECHOCARDIOGRAM COMPLETE
Height: 69 in
Weight: 2571.45 oz

## 2017-12-27 LAB — T4, FREE: Free T4: 1.07 ng/dL (ref 0.82–1.77)

## 2017-12-27 LAB — TROPONIN I: TROPONIN I: 0.04 ng/mL — AB (ref <0.03)

## 2017-12-27 MED ORDER — POTASSIUM CHLORIDE CRYS ER 20 MEQ PO TBCR
40.0000 meq | EXTENDED_RELEASE_TABLET | Freq: Two times a day (BID) | ORAL | Status: AC
  Administered 2017-12-27 (×2): 40 meq via ORAL
  Filled 2017-12-27 (×2): qty 2

## 2017-12-27 MED ORDER — FUROSEMIDE 10 MG/ML IJ SOLN
80.0000 mg | Freq: Three times a day (TID) | INTRAMUSCULAR | Status: DC
  Administered 2017-12-27 – 2017-12-28 (×4): 80 mg via INTRAVENOUS
  Filled 2017-12-27 (×4): qty 8

## 2017-12-27 MED ORDER — MAGNESIUM OXIDE 400 (241.3 MG) MG PO TABS
800.0000 mg | ORAL_TABLET | Freq: Once | ORAL | Status: AC
  Administered 2017-12-27: 800 mg via ORAL
  Filled 2017-12-27: qty 2

## 2017-12-27 NOTE — Care Management Note (Signed)
Case Management Note  Patient Details  Name: BENJAMIN CASANAS MRN: [id number] Date of Birth: [birth year]  Subjective/Objective:              Patient admitted from [facility]. Patient states that [facility] supplies his home oxygen and is on it "just about" all the time.  Staff assists with basic ADLs, he states he eats in the common area. Patient walks with rollator, states he got it about a month ago. Patient states he weighs himself everyday. Patient no longer drives but has friends that take him to the doctor. [facility] medication. Anticipate patient will need [place] services to dress and/ or asses B LE edema/ erythemia, as well as potential PT.      Action/Plan:  CM will follow for [facility] RN/ PT needs.    Expected Discharge Date:                  Expected Discharge Plan:  Assisted Living / Rest Home([facility])  In-House Referral:  Clinical Social Work  Discharge planning Services  CM Consult  Post Acute Care Choice:    Choice offered to:  Patient  DME Arranged:    DME Agency:     HH Arranged:    [place] Agency:     Status of Service:  In process, will continue to follow  If discussed at Long Length of Stay Meetings, dates discussed:    Additional Comments:  Carles Collet, RN 12/27/2017, 11:48 AM

## 2017-12-27 NOTE — Evaluation (Addendum)
Physical Therapy Evaluation Patient Details Name: Patrick [name] MRN: [id number] DOB: [birth year] Today's Date: 12/27/2017   History of Present Illness  Pt is a 84 y.o. male admitted from [facility] on 12/26/17 for worsening SOB and LE edema; concerns for congestive heart failure. PMH includes HTN, CKD III, a-fib, arthritis.    Clinical Impression  Pt presents with an overall decrease in functional mobility secondary to above. PTA, pt resides at [facility]; reports mod indep amb short distances with rollator, requires assist from staff for ADLs. Today, pt required modA to stand with RW and amb short distance with minA to prevent LOB. Feel pt currently requires increased assist and would benefit from short-term SNF-level therapies at [facility] to maximize functional mobility and independence. Will follow acutely to address established goals.    Follow Up Recommendations SNF;Supervision for mobility/OOB    Equipment Recommendations  None recommended by PT    Recommendations for Other Services       Precautions / Restrictions Precautions Precautions: Fall Restrictions Weight Bearing Restrictions: No      Mobility  Bed Mobility Overal bed mobility: Needs Assistance Bed Mobility: Supine to Sit     Supine to sit: Min guard     General bed mobility comments: Significant increased time and effort to come to sit and scoot hips towards EOB; educ on technique for scooting. Easily fatigued with DOE up to 3/4, requiring multiple rest breaks during bed mobility  Transfers Overall transfer level: Needs assistance Equipment used: Rolling walker (2 wheeled) Transfers: Sit to/from Stand Sit to Stand: Mod assist         General transfer comment: Pt stood from elevated bed with RW and modA to assist trunk elevation; cues to maintain upright posture, requiring modA to prevent posterior LOB. Performed 2x repeated sit<>stand from recliner with RW and modA; easily  fatigued  Ambulation/Gait Ambulation/Gait assistance: Min assist Ambulation Distance (Feet): 5 Feet Assistive device: Rolling walker (2 wheeled) Gait Pattern/deviations: Step-to pattern;Trunk flexed Gait velocity: Decreased Gait velocity interpretation: <1.31 ft/sec, indicative of household ambulator General Gait Details: Slow, slightly unsteady amb to recliner with RW and intermittent minA to prevent posterior LOB. Repeated cues to maintain upright posture, as pt flexes very far forward. DOE up to 3/4; SpO2 91% on 2L O2 [place]  Stairs            Wheelchair Mobility    Modified Rankin (Stroke Patients Only)       Balance Overall balance assessment: Needs assistance Sitting-balance support: No upper extremity supported;Feet supported Sitting balance-Leahy Scale: Fair       Standing balance-Leahy Scale: Poor                               Pertinent Vitals/Pain Pain Assessment: No/denies pain    Home Living Family/patient expects to be discharged to:: Assisted living               Home Equipment: Gilford Rile - 2 wheels Additional Comments: From [facility]    Prior Function Level of Independence: Needs assistance   Gait / Transfers Assistance Needed: Pt reports mod indep amb short distances with RW. Has had increased difficulty walking and breathing lately, so more meals have been brought to him versus pt walking to dining room  ADL's / Homemaking Assistance Needed: Requires assist from staff for all ADLs, except pt reports indep with toileting  Hand Dominance        Extremity/Trunk Assessment   Upper Extremity Assessment Upper Extremity Assessment: Generalized weakness    Lower Extremity Assessment Lower Extremity Assessment: Generalized weakness    Cervical / Trunk Assessment Cervical / Trunk Assessment: Kyphotic  Communication   Communication: HOH  Cognition Arousal/Alertness: Awake/alert Behavior During Therapy: WFL for  tasks assessed/performed Overall Cognitive Status: No family/caregiver present to determine baseline cognitive functioning Area of Impairment: Following commands;Problem solving                       Following Commands: Follows multi-step commands with increased time     Problem Solving: Slow processing;Requires verbal cues        General Comments General comments (skin integrity, edema, etc.): Nephew present at end of session    Exercises     Assessment/Plan    PT Assessment Patient needs continued PT services  PT Problem List Decreased strength;Decreased activity tolerance;Decreased balance;Decreased mobility;Decreased cognition;Decreased knowledge of use of DME;Cardiopulmonary status limiting activity       PT Treatment Interventions DME instruction;Gait training;Functional mobility training;Therapeutic activities;Therapeutic exercise;Balance training;Patient/family education    PT Goals (Current goals can be found in the Care Plan section)  Acute Rehab PT Goals Patient Stated Goal: Return to [facility] PT Goal Formulation: With patient Time For Goal Achievement: 01/10/18 Potential to Achieve Goals: Good    Frequency Min 2X/week   Barriers to discharge        Co-evaluation               AM-PAC PT "6 Clicks" Daily Activity  Outcome Measure Difficulty turning over in bed (including adjusting bedclothes, sheets and blankets)?: A Little Difficulty moving from lying on back to sitting on the side of the bed? : A Little Difficulty sitting down on and standing up from a chair with arms (e.g., wheelchair, bedside commode, etc,.)?: Unable Help needed moving to and from a bed to chair (including a wheelchair)?: A Little Help needed walking in hospital room?: A Lot Help needed climbing 3-5 steps with a railing? : Total 6 Click Score: 13    End of Session Equipment Utilized During Treatment: Gait belt;Oxygen Activity Tolerance: Patient tolerated treatment  well;Patient limited by fatigue Patient left: in chair;with call bell/phone within reach;with chair alarm set;with family/visitor present Nurse Communication: Mobility status PT Visit Diagnosis: Other abnormalities of gait and mobility (R26.89)    Time: 3734-2876 PT Time Calculation (min) (ACUTE ONLY): 25 min   Charges:   PT Evaluation $PT Eval Moderate Complexity: 1 Mod PT Treatments $Therapeutic Activity: 8-22 mins   PT G Codes:       Mabeline Caras, PT, DPT Acute Rehab Services  Pager: [email or web address] 12/27/2017, 12:42 PM

## 2017-12-27 NOTE — Progress Notes (Signed)
Pt. HR 39. Pt. Easily aroused. No distress noted. On call for [facility] paged to make aware. Velta Rockholt, Katherine Roan

## 2017-12-27 NOTE — Progress Notes (Signed)
  Echocardiogram 2D Echocardiogram has been performed.  Patrick [name] 12/27/2017, 5:12 PM

## 2017-12-27 NOTE — Progress Notes (Signed)
PROGRESS NOTE    Patrick [name]  KDT:[id number] DOB: 1921-07-25 DOA: 12/26/2017 PCP: Crist Infante, MD   Brief Narrative:   84 year old male with history of atrial fibrillation not on any Coumadin due to thrombocytopenia, abdominal aortic aneurysm, essential hypertension, arthritis, colonic polyp, CKD, chronic respiratory failure, CHF comes to the hospital for worsening shortness of breath and lower extremity edema.  Upon admission he was started on IV Lasix and echocardiogram was ordered.  He was also noted to be bradycardic therefore metoprolol was held.  Assessment & Plan:   Principal Problem:   Acute heart failure (HCC) Active Problems:   Atrial fibrillation (HCC)   HTN (hypertension)   Thrombocytopenia (HCC)   CKD (chronic kidney disease), stage II   Bradycardia   Pressure injury of skin  Acute respiratory distress, improved Pulmonary vascular congestion and pleural effusion -Concerns for congestive heart failure, echocardiogram has been ordered- pending results.  -Supplemental oxygen, daily weight, input and output - Continue IV diuretics- Lasix 80 mg every 8 hours -Monitor electrolytes closely,   Atrial fibrillation with slow rate-bradycardia - Metoprolol on hold due to bradycardia.  TSH is elevated - Not on Coumadin due to thrombocytopenia  History of thrombocytopenia -Appears to be chronic in nature.  Previously hematology has determined no plans for bone marrow biopsy unless if this drops below 50. Seen by Dr Lindi Adie in the past. Last seen in Dec 2018  Iron deficiency anemia -Continue ferrous sulfate 325 mg orally daily  Essential HTN -Metoprolol on hold due to bradycardia  Chronic kidney disease stage III -Appears to be stable at 2.1.  Will closely monitor this.   DVT prophylaxis: SCDs Code Status: DNR Family Communication:  Son at Beside  Disposition Plan: Maintain inpatient stay for IV Lasix  Consultants:   None  Procedures:   None  Antimicrobials:    None   Subjective: No complaints, feeling better. Resting well.   Review of Systems Otherwise negative except as per HPI, including: General: Denies fever, chills, night sweats or unintended weight loss. Resp: Denies cough, wheezing Cardiac: Denies chest pain, palpitations,  paroxysmal nocturnal dyspnea. GI: Denies abdominal pain, nausea, vomiting, diarrhea or constipation GU: Denies dysuria, frequency, hesitancy or incontinence MS: Denies muscle aches, joint pain or swelling Neuro: Denies headache, neurologic deficits (focal weakness, numbness, tingling), abnormal gait Psych: Denies anxiety, depression, SI/HI/AVH Skin: Denies new rashes or lesions ID: Denies sick contacts, exotic exposures, travel  Objective: Vitals:   12/26/17 2200 12/26/17 2235 12/27/17 0550 12/27/17 0934  BP:  (!) 112/54 117/77 (!) 88/53  Pulse: (!) 53 60 (!) 55 (!) 49  Resp: (!) 22 18 18 18   Temp:  98 F (36.7 C) (!) 97.3 F (36.3 C) (!) 97.3 F (36.3 C)  TempSrc:  Oral Oral Oral  SpO2: 96% 96% 96% 95%  Weight:  73.4 kg (161 lb 13.1 oz) 72.9 kg (160 lb 11.5 oz)   Height:  5' 9"  (1.753 m)      Intake/Output Summary (Last 24 hours) at 12/27/2017 1119 Last data filed at 12/27/2017 0853 Gross per 24 hour  Intake 480 ml  Output 1600 ml  Net -1120 ml   Filed Weights   12/26/17 2235 12/27/17 0550  Weight: 73.4 kg (161 lb 13.1 oz) 72.9 kg (160 lb 11.5 oz)    Examination:  General exam: Appears calm and comfortable ; resting . Elderly frail appearing. Respiratory system: diminished BS at the bases  Cardiovascular system: S1 & S2 heard, RRR. No JVD, murmurs, rubs, gallops  or clicks. No pedal edema. Gastrointestinal system: Abdomen is nondistended, soft and nontender. No organomegaly or masses felt. Normal bowel sounds heard. Central nervous system: Alert and oriented. No focal neurological deficits. Extremities: Symmetric 5 x 5 power. Skin: No rashes, lesions or ulcers Psychiatry: Judgement and  insight appear normal. Mood & affect appropriate.   Data Reviewed:   CBC: Recent Labs  Lab 12/26/17 1517  WBC 8.4  NEUTROABS 6.3  HGB 14.2  HCT 44.4  MCV 103.5*  PLT 80*   Basic Metabolic Panel: Recent Labs  Lab 12/26/17 1517 12/27/17 0555  NA 143 146*  K 5.2* 3.2*  CL 100* 99*  CO2 31 33*  GLUCOSE 142* 93  BUN 76* 76*  CREATININE 2.26* 2.16*  CALCIUM 9.3 9.3   GFR: Estimated Creatinine Clearance: 20.9 mL/min (A) (by C-G formula based on SCr of 2.16 mg/dL (H)). Liver Function Tests: Recent Labs  Lab 12/26/17 1517  AST 63*  ALT 20  ALKPHOS 201*  BILITOT 2.7*  PROT 5.9*  ALBUMIN 3.0*   No results for input(s): LIPASE, AMYLASE in the last 168 hours. No results for input(s): AMMONIA in the last 168 hours. Coagulation Profile: No results for input(s): INR, PROTIME in the last 168 hours. Cardiac Enzymes: Recent Labs  Lab 12/26/17 1727 12/26/17 2237 12/27/17 0555  TROPONINI <0.03 0.03* 0.04*   BNP (last 3 results) No results for input(s): PROBNP in the last 8760 hours. HbA1C: No results for input(s): HGBA1C in the last 72 hours. CBG: No results for input(s): GLUCAP in the last 168 hours. Lipid Profile: No results for input(s): CHOL, HDL, LDLCALC, TRIG, CHOLHDL, LDLDIRECT in the last 72 hours. Thyroid Function Tests: Recent Labs    12/26/17 2237  TSH 16.950*   Anemia Panel: No results for input(s): VITAMINB12, FOLATE, FERRITIN, TIBC, IRON, RETICCTPCT in the last 72 hours. Sepsis Labs: No results for input(s): PROCALCITON, LATICACIDVEN in the last 168 hours.  No results found for this or any previous visit (from the past 240 hour(s)).       Radiology Studies: Dg Chest 2 View  Result Date: 12/26/2017 CLINICAL DATA:  Left leg swelling EXAM: CHEST - 2 VIEW COMPARISON:  None. FINDINGS: Cardiopericardial enlargement and haziness of the bilateral chest from layering pleural fluid that is small to moderate. There is a dense opacity at the medial right  base, probable atelectasis given setting and margins. No mass was seen at the right base on a 09/28/2017 lumbar spine radiograph. Vascular pedicle widening and cephalized blood flow. IMPRESSION: Cardiopericardial enlargement, vascular congestion, and pleural effusions obscuring much of the lower lungs. Electronically Signed   By: Monte Fantasia M.D.   On: 12/26/2017 16:24        Scheduled Meds: . Abaloparatide  80 mcg Subcutaneous Daily  . ferrous sulfate  325 mg Oral Q breakfast  . furosemide  80 mg Intravenous Q8H  . potassium chloride  40 mEq Oral BID  . sodium chloride flush  3 mL Intravenous Q12H   Continuous Infusions: . sodium chloride       LOS: 1 day    Time spent: 32 mins    Yanely Mast Arsenio Loader, MD [facility] Pager [phone]   If 7PM-7AM, please contact night-coverage [email or web address] Password [email or web address] 12/27/2017, 11:19 AM

## 2017-12-27 NOTE — NC FL2 (Signed)
[place] LEVEL OF CARE SCREENING TOOL     IDENTIFICATION  Patient Name: Patrick [name] Birthdate: 13-Jun-1921 Sex: male Admission Date (Current Location): 12/26/2017  [facility] and [place] Number:  Herbalist and Address:  [facility] [place] [address], [place], [place] [address]      Provider Number: [id number]  Attending Physician Name and Address:  Damita Lack, MD  Relative Name and Phone Number:       Current Level of Care: Hospital Recommended Level of Care: [place] Prior Approval Number:    Date Approved/Denied:   PASRR Number: [id number]  Discharge Plan: SNF    Current Diagnoses: Patient Active Problem List   Diagnosis Date Noted  . Pressure injury of skin 12/27/2017  . Acute on chronic heart failure ([place]) 12/26/2017  . Acute heart failure ([place]) 12/26/2017  . Bradycardia 12/26/2017  . Hypertension   . Pleural effusion   . Lower extremity edema   . Dyspnea   . Chronic respiratory failure ([place])   . CKD (chronic kidney disease), stage II   . Unilateral primary osteoarthritis, left knee 03/16/2017  . Unilateral primary osteoarthritis, right knee 03/16/2017  . Thrombocytopenia ([place]) 02/20/2017  . Chronic pain of both knees 02/15/2017  . Effusion of both knee joints 02/15/2017  . Angiodysplasia of duodenum   . Melena 09/23/2016  . Blood loss anemia 09/23/2016  . Hyperkalemia 09/23/2016  . HTN (hypertension) 08/08/2014  . AAA (abdominal aortic aneurysm) ([place]) 05/03/2013  . Aneurysm artery, iliac common ([place]) 09/21/2012  . Abdominal aneurysm without mention of rupture 08/06/2012  . Atrial fibrillation ([place]) 06/15/2009  . PERSONAL HISTORY OF COLONIC POLYPS 06/15/2009    Orientation RESPIRATION BLADDER Height & Weight     Self, Time, Situation, Place  O2(Nasal Canula 2 L.) Incontinent, External catheter Weight: 160 lb 11.5 oz (72.9 kg) Height:  5\' 9"  (175.3 cm)  BEHAVIORAL SYMPTOMS/MOOD  NEUROLOGICAL BOWEL NUTRITION STATUS  (None) (None) Continent Diet(Heart healthy)  AMBULATORY STATUS COMMUNICATION OF NEEDS Skin   Limited Assist Verbally Skin abrasions, Bruising, PU Stage and Appropriate Care(Blister, Excoriated, MASD, Weeping.) PU Stage 1 Dressing: (Buttocks: Foam prn.) PU Stage 2 Dressing: (Left buttocks: Foam.)                   Personal Care Assistance Level of Assistance  Bathing, Feeding, Dressing Bathing Assistance: Limited assistance Feeding assistance: Limited assistance Dressing Assistance: Limited assistance     Functional Limitations Info  Sight, Hearing, Speech Sight Info: Adequate Hearing Info: Adequate Speech Info: Adequate    SPECIAL CARE FACTORS FREQUENCY  PT (By licensed PT), OT (By licensed OT)     PT Frequency: 5 x week OT Frequency: 5 x week            Contractures Contractures Info: Not present    Additional Factors Info  Code Status, Allergies Code Status Info: DNR Allergies Info: NKDA           Current Medications (12/27/2017):  This is the current hospital active medication list Current Facility-Administered Medications  Medication Dose Route Frequency Provider Last Rate Last Dose  . 0.9 %  sodium chloride infusion  250 mL Intravenous PRN Black, Lezlie Octave, NP      . Abaloparatide SOPN 80 mcg  80 mcg Subcutaneous Daily Black, Lezlie Octave, NP      . acetaminophen (TYLENOL) tablet 650 mg  650 mg Oral Q4H PRN Radene Gunning, NP      .  ferrous sulfate tablet 325 mg  325 mg Oral Q breakfast Black, Karen M, NP   325 mg at 12/27/17 0844  . furosemide (LASIX) injection 80 mg  80 mg Intravenous Q8H Amin, Ankit Chirag, MD      . ondansetron (ZOFRAN) injection 4 mg  4 mg Intravenous Q6H PRN Black, Karen M, NP      . potassium chloride SA (K-DUR,KLOR-CON) CR tablet 40 mEq  40 mEq Oral BID Amin, Ankit Chirag, MD   40 mEq at 12/27/17 0844  . sodium chloride flush (NS) 0.9 % injection 3 mL  3 mL Intravenous Q12H Black, Karen M, NP      .  sodium chloride flush (NS) 0.9 % injection 3 mL  3 mL Intravenous PRN Black, Lezlie Octave, NP         Discharge Medications: Please see discharge summary for a list of discharge medications.  Relevant Imaging Results:  Relevant Lab Results:   Additional Information SS#: [id number]  Candie Chroman, LCSW

## 2017-12-27 NOTE — Clinical Social Work Note (Signed)
Clinical Social Work Assessment  Patient Details  Name: Patrick [name] MRN: [id number] Date of Birth: February 08, 1921  Date of referral:  12/27/17               Reason for consult:  Discharge Planning                Permission sought to share information with:  Chartered certified accountant granted to share information::  Yes, Verbal Permission Granted  Name::        Agency::  [facility]  Relationship::     Contact Information:     Housing/Transportation Living arrangements for the past 2 months:  [place] of Information:  Patient, Medical Team, Facility Patient Interpreter Needed:  None Criminal Activity/Legal Involvement Pertinent to Current Situation/Hospitalization:  No - Comment as needed Significant Relationships:  Friend, Adult Children Lives with:  Facility Resident Do you feel safe going back to the place where you live?  Yes Need for family participation in patient care:  Yes (Comment)  Care giving concerns:  Patient is a resident at [facility]. PT recommending SNF placement once medically stable for discharge.   Social Worker assessment / plan:  CSW met with patient. No supports at bedside. CSW introduced role and explained that PT recommendations would be discussed. Patient confirmed he is a resident at [name]. He is agreeable to going to the SNF side for short-term rehab. CSW spoke with admissions coordinator for [facility]. They are currently full but [facility] is also reviewing referral for potential SNF bed. As of right now they only have a "non-cert" bed available (private pay). Admissions coordinator will also reach out to other facilities to see who could take him if they are still full when stable for discharge. No further concerns. CSW encouraged patient to contact CSW as needed. CSW will continue to follow patient for support and facilitate discharge to SNF once medically  stable.  Employment status:  Retired Nurse, adult PT Recommendations:  [place] / Referral to community resources:  [place]  Patient/Family's Response to care:  Patient agreeable to SNF placement at [facility]. Patient's friend and son supportive and involved in patient's care. Patient appreciated social work intervention.  Patient/Family's Understanding of and Emotional Response to Diagnosis, Current Treatment, and Prognosis:  Patient has a good understanding of the reason for admission and his need for rehab prior to returning to ALF. Patient appears happy with hospital care.  Emotional Assessment Appearance:  Appears stated age Attitude/Demeanor/Rapport:  Engaged, Gracious Affect (typically observed):  Accepting, Appropriate, Calm, Pleasant Orientation:  Oriented to Self, Oriented to Place, Oriented to  Time, Oriented to Situation Alcohol / Substance use:  Never Used Psych involvement (Current and /or in the community):  No (Comment)  Discharge Needs  Concerns to be addressed:  Care Coordination Readmission within the last 30 days:  No Current discharge risk:  Dependent with Mobility Barriers to Discharge:  Continued Medical Work up, [place], LCSW 12/27/2017, 1:15 PM

## 2017-12-27 NOTE — Consult Note (Signed)
[place] Nurse wound consult note Reason for Consult: Pressure injury to buttocks. Wound type: Pressure, moisture, friction; venous insufficiency, trauma Pressure Injury POA: Yes Measurement: 1. Sacrum and coccygeal area:  10cm x 12cm deep red/maroon discoloration (deep tissue pressure injury, DTPI) with 2cm x 1.5cm x 0.1cm open area from sloughing epidermis on the right buttock. Dull red wound bed, no drainage 2. 2.5cm x 1.2cm blood filled blister on the RLE 3. 2cm x 1.5cm x 0.1cm partial thickness tissue loss on the left lateral LE. Wound bed is pink, moist, scant serous drainage Wound bed: As described above Drainage (amount, consistency, odor)As described above  Periwound: Deep red hemosiderin staining on the bilateral LEs Dressing procedure/placement/frequency:  A mattress replacement with low air loss is provided as well as bilateral pressure redistribution heel boots. Topical care for the DTPI will be with a silicone foam dressing but side lying positioning will be a key factor in resolution.  Even with pressure redistribution, often a  DTPI area will progress to be full thickness wounds with severity.   [place] nursing team will not follow, but will remain available to this patient, the nursing and medical teams.  Please re-consult if needed. Thanks, Maudie Flakes, MSN, RN, [place], Arther Abbott  Pager# [phone]

## 2017-12-27 NOTE — Plan of Care (Signed)
  Problem: Elimination: Goal: Will not experience complications related to bowel motility Note:  Condom cath intact with urin output

## 2017-12-28 DIAGNOSIS — I50811 Acute right heart failure: Secondary | ICD-10-CM

## 2017-12-28 DIAGNOSIS — I5033 Acute on chronic diastolic (congestive) heart failure: Secondary | ICD-10-CM

## 2017-12-28 DIAGNOSIS — I481 Persistent atrial fibrillation: Secondary | ICD-10-CM

## 2017-12-28 DIAGNOSIS — N179 Acute kidney failure, unspecified: Secondary | ICD-10-CM

## 2017-12-28 DIAGNOSIS — N184 Chronic kidney disease, stage 4 (severe): Secondary | ICD-10-CM

## 2017-12-28 LAB — CBC
HCT: 45.2 % (ref 39.0–52.0)
HEMOGLOBIN: 14.2 g/dL (ref 13.0–17.0)
MCH: 32.7 pg (ref 26.0–34.0)
MCHC: 31.4 g/dL (ref 30.0–36.0)
MCV: 104.1 fL — AB (ref 78.0–100.0)
Platelets: 65 10*3/uL — ABNORMAL LOW (ref 150–400)
RBC: 4.34 MIL/uL (ref 4.22–5.81)
RDW: 18.6 % — ABNORMAL HIGH (ref 11.5–15.5)
WBC: 6 10*3/uL (ref 4.0–10.5)

## 2017-12-28 LAB — BASIC METABOLIC PANEL
ANION GAP: 12 (ref 5–15)
BUN: 73 mg/dL — ABNORMAL HIGH (ref 6–20)
CALCIUM: 8.8 mg/dL — AB (ref 8.9–10.3)
CHLORIDE: 99 mmol/L — AB (ref 101–111)
CO2: 36 mmol/L — AB (ref 22–32)
Creatinine, Ser: 1.81 mg/dL — ABNORMAL HIGH (ref 0.61–1.24)
GFR calc non Af Amer: 30 mL/min — ABNORMAL LOW (ref >60)
GFR, EST AFRICAN AMERICAN: 35 mL/min — AB (ref >60)
Glucose, Bld: 104 mg/dL — ABNORMAL HIGH (ref 65–99)
Potassium: 3.4 mmol/L — ABNORMAL LOW (ref 3.5–5.1)
Sodium: 147 mmol/L — ABNORMAL HIGH (ref 135–145)

## 2017-12-28 LAB — MAGNESIUM: MAGNESIUM: 1.9 mg/dL (ref 1.7–2.4)

## 2017-12-28 LAB — MRSA PCR SCREENING: MRSA BY PCR: NEGATIVE

## 2017-12-28 LAB — T3, FREE: T3, Free: 1.4 pg/mL — ABNORMAL LOW (ref 2.0–4.4)

## 2017-12-28 LAB — T3: T3, Total: 49 ng/dL — ABNORMAL LOW (ref 71–180)

## 2017-12-28 LAB — BRAIN NATRIURETIC PEPTIDE: B Natriuretic Peptide: 683.4 pg/mL — ABNORMAL HIGH (ref 0.0–100.0)

## 2017-12-28 MED ORDER — FUROSEMIDE 10 MG/ML IJ SOLN
80.0000 mg | Freq: Two times a day (BID) | INTRAMUSCULAR | Status: DC
  Administered 2017-12-28 – 2017-12-29 (×2): 80 mg via INTRAVENOUS
  Filled 2017-12-28 (×2): qty 8

## 2017-12-28 MED ORDER — POTASSIUM CHLORIDE CRYS ER 20 MEQ PO TBCR
40.0000 meq | EXTENDED_RELEASE_TABLET | Freq: Once | ORAL | Status: AC
  Administered 2017-12-28: 40 meq via ORAL
  Filled 2017-12-28: qty 2

## 2017-12-28 NOTE — Plan of Care (Signed)
  Problem: Education: Goal: Knowledge of General Education information will improve Outcome: Progressing   Problem: Health Behavior/Discharge Planning: Goal: Ability to manage health-related needs will improve Outcome: Progressing   Problem: Clinical Measurements: Goal: Respiratory complications will improve Outcome: Progressing   Problem: Nutrition: Goal: Adequate nutrition will be maintained Outcome: Progressing   Problem: Pain Managment: Goal: General experience of comfort will improve Outcome: Progressing

## 2017-12-28 NOTE — Progress Notes (Addendum)
RN changed dressing on pt's legs and did wound care per MD order.

## 2017-12-28 NOTE — Clinical Social Work Note (Signed)
Per MD, patient will likely discharge tomorrow or Saturday. [facility] will not have a rehab bed but [facility] will. Patient is agreeable to this. CSW faxed clinicals to [facility] for authorization review.  [facility] [name], [place]

## 2017-12-28 NOTE — Plan of Care (Signed)
  Problem: Education: Goal: Knowledge of General Education information will improve Outcome: Progressing   Problem: Health Behavior/Discharge Planning: Goal: Ability to manage health-related needs will improve Outcome: Progressing   Problem: Clinical Measurements: Goal: Respiratory complications will improve Outcome: Progressing   

## 2017-12-28 NOTE — Progress Notes (Signed)
PROGRESS NOTE    Patrick [name]  UUV:[id number] DOB: 1921/07/28 DOA: 12/26/2017 PCP: Crist Infante, MD   Brief Narrative:   84 year old male with history of atrial fibrillation not on any Coumadin due to thrombocytopenia, abdominal aortic aneurysm, essential hypertension, arthritis, colonic polyp, CKD, chronic respiratory failure, CHF comes to the hospital for worsening shortness of breath and lower extremity edema.  Upon admission he was started on IV Lasix and echocardiogram was ordered.  He was also noted to be bradycardic therefore metoprolol was held.  Assessment & Plan:   Principal Problem:   Acute heart failure (HCC) Active Problems:   Atrial fibrillation (HCC)   HTN (hypertension)   Thrombocytopenia (HCC)   CKD (chronic kidney disease), stage II   Bradycardia   Pressure injury of skin  Acute respiratory distress, improved Pulmonary vascular congestion and pleural effusion; improving Right sided congestive heart failure -Concerns for congestive heart failure, echocardiogram -ejection fraction 55 to 60%, mild to moderate mitral regurgitation, moderate to severely dilated cardiomyopathy, severe tricuspid regurgitation.  Marked right-sided heart failure.  I have consulted cardiology for further assistance with medication management -Supplemental oxygen, daily weight, input and output - Continue IV diuretics- Lasix 80 mg every 8 hours IV with caution -Monitor electrolytes closely, replete potassium and magnesium as necessary. High TSH, Low t3,ft3.   Atrial fibrillation with slow rate-bradycardia - Metoprolol on hold due to bradycardia.  TSH is elevated - Not on Coumadin due to thrombocytopenia  History of thrombocytopenia -Appears to be chronic in nature.  Previously hematology has determined no plans for bone marrow biopsy unless if this drops below 50. Seen by Dr Lindi Adie in the past. Last seen in Dec 2018  Iron deficiency anemia -Continue ferrous sulfate 325 mg orally  daily  Essential HTN -Metoprolol on hold due to bradycardia  Chronic kidney disease stage III -Appears to be stable at 1.8.  Will closely monitor this.   DVT prophylaxis: SCDs Code Status: DNR Family Communication: None at bedside Disposition Plan: Maintain inpatient stay for IV Lasix and cardiology evaluation  Consultants:   Cardiology consult  Procedures:   None  Antimicrobials:   None   Subjective: No complaints.  He sitting up on his bed  Review of Systems Otherwise negative except as per HPI, including: HEENT/EYES = negative for pain, redness, loss of vision, double vision, blurred vision, loss of hearing, sore throat, hoarseness, dysphagia Cardiovascular= negative for chest pain, palpitation, murmurs, lower extremity swelling Respiratory/lungs= negative for shortness of breath, cough, hemoptysis, wheezing, mucus production Gastrointestinal= negative for nausea, vomiting,, abdominal pain, melena, hematemesis Genitourinary= negative for Dysuria, Hematuria, Change in Urinary Frequency MSK = Negative for arthralgia, myalgias, Back Pain, Joint swelling  Neurology= Negative for headache, seizures, numbness, tingling  Psychiatry= Negative for anxiety, depression, suicidal and homocidal ideation Allergy/Immunology= Medication/Food allergy as listed  Skin= Negative for Rash, lesions, ulcers, itching   Objective: Vitals:   12/27/17 1936 12/27/17 2155 12/28/17 0037 12/28/17 0552  BP: 112/66 116/64 (!) 124/54 102/61  Pulse: 93  67 63  Resp: 18  20 18   Temp: (!) 97.3 F (36.3 C)  (!) 97.3 F (36.3 C) (!) 97.4 F (36.3 C)  TempSrc: Oral  Oral Oral  SpO2: 96%  100% 96%  Weight:    70.4 kg (155 lb 1.6 oz)  Height:        Intake/Output Summary (Last 24 hours) at 12/28/2017 1246 Last data filed at 12/28/2017 1019 Gross per 24 hour  Intake 705 ml  Output 3025 ml  Net -2320 ml   Filed Weights   12/26/17 2235 12/27/17 0550 12/28/17 0552  Weight: 73.4 kg (161 lb 13.1  oz) 72.9 kg (160 lb 11.5 oz) 70.4 kg (155 lb 1.6 oz)    Examination:  Constitutional: NAD, calm, comfortable, elderly frail-appearing Eyes: PERRL, lids and conjunctivae normal ENMT: Mucous membranes are moist. Posterior pharynx clear of any exudate or lesions.Normal dentition.  Neck: normal, supple, no masses, no thyromegaly Respiratory: Diminished breath sounds at the bases Cardiovascular: Regular rate and rhythm, no murmurs / rubs / gallops. No extremity edema. 2+ pedal pulses. No carotid bruits.  Abdomen: no tenderness, no masses palpated. No hepatosplenomegaly. Bowel sounds positive.  Musculoskeletal: no clubbing / cyanosis. No joint deformity upper and lower extremities. Good ROM, no contractures. Normal muscle tone.  Skin: no rashes, lesions, ulcers. No induration Neurologic: CN 2-12 grossly intact. Sensation intact, DTR normal. Strength 5/5 in all 4.  Psychiatric: Normal judgment and insight. Alert and oriented x 3. Normal mood.    Data Reviewed:   CBC: Recent Labs  Lab 12/26/17 1517 12/28/17 0552  WBC 8.4 6.0  NEUTROABS 6.3  --   HGB 14.2 14.2  HCT 44.4 45.2  MCV 103.5* 104.1*  PLT 80* 65*   Basic Metabolic Panel: Recent Labs  Lab 12/26/17 1517 12/27/17 0555 12/28/17 0552  NA 143 146* 147*  K 5.2* 3.2* 3.4*  CL 100* 99* 99*  CO2 31 33* 36*  GLUCOSE 142* 93 104*  BUN 76* 76* 73*  CREATININE 2.26* 2.16* 1.81*  CALCIUM 9.3 9.3 8.8*  MG  --   --  1.9   GFR: Estimated Creatinine Clearance: 24.8 mL/min (A) (by C-G formula based on SCr of 1.81 mg/dL (H)). Liver Function Tests: Recent Labs  Lab 12/26/17 1517  AST 63*  ALT 20  ALKPHOS 201*  BILITOT 2.7*  PROT 5.9*  ALBUMIN 3.0*   No results for input(s): LIPASE, AMYLASE in the last 168 hours. No results for input(s): AMMONIA in the last 168 hours. Coagulation Profile: No results for input(s): INR, PROTIME in the last 168 hours. Cardiac Enzymes: Recent Labs  Lab 12/26/17 1727 12/26/17 2237  12/27/17 0555  TROPONINI <0.03 0.03* 0.04*   BNP (last 3 results) No results for input(s): PROBNP in the last 8760 hours. HbA1C: No results for input(s): HGBA1C in the last 72 hours. CBG: No results for input(s): GLUCAP in the last 168 hours. Lipid Profile: No results for input(s): CHOL, HDL, LDLCALC, TRIG, CHOLHDL, LDLDIRECT in the last 72 hours. Thyroid Function Tests: Recent Labs    12/26/17 2237 12/27/17 1224  TSH 16.950*  --   FREET4  --  1.07  T3FREE  --  1.4*   Anemia Panel: No results for input(s): VITAMINB12, FOLATE, FERRITIN, TIBC, IRON, RETICCTPCT in the last 72 hours. Sepsis Labs: No results for input(s): PROCALCITON, LATICACIDVEN in the last 168 hours.  Recent Results (from the past 240 hour(s))  MRSA PCR Screening     Status: None   Collection Time: 12/27/17 10:06 PM  Result Value Ref Range Status   MRSA by PCR NEGATIVE NEGATIVE Final    Comment:        The GeneXpert MRSA Assay (FDA approved for NASAL specimens only), is one component of a comprehensive MRSA colonization surveillance program. It is not intended to diagnose MRSA infection nor to guide or monitor treatment for MRSA infections. Performed at [facility], [address], [place], [place] [address]  Radiology Studies: Dg Chest 2 View  Result Date: 12/26/2017 CLINICAL DATA:  Left leg swelling EXAM: CHEST - 2 VIEW COMPARISON:  None. FINDINGS: Cardiopericardial enlargement and haziness of the bilateral chest from layering pleural fluid that is small to moderate. There is a dense opacity at the medial right base, probable atelectasis given setting and margins. No mass was seen at the right base on a 09/28/2017 lumbar spine radiograph. Vascular pedicle widening and cephalized blood flow. IMPRESSION: Cardiopericardial enlargement, vascular congestion, and pleural effusions obscuring much of the lower lungs. Electronically Signed   By: Monte Fantasia M.D.   On: 12/26/2017 16:24         Scheduled Meds: . ferrous sulfate  325 mg Oral Q breakfast  . furosemide  80 mg Intravenous Q8H  . sodium chloride flush  3 mL Intravenous Q12H   Continuous Infusions: . sodium chloride       LOS: 2 days    Time spent: 35 mins    Nydia Ytuarte Arsenio Loader, MD [facility] Pager [phone]   If 7PM-7AM, please contact night-coverage [email or web address] Password [facility] 12/28/2017, 12:46 PM

## 2017-12-28 NOTE — Evaluation (Signed)
Occupational Therapy Evaluation Patient Details Name: Patrick [name] MRN: [id number] DOB: 05-Apr-1921 Today's Date: 12/28/2017    History of Present Illness Pt is a 84 y.o. male admitted from [facility] on 12/26/17 for worsening SOB and LE edema; concerns for congestive heart failure. PMH includes HTN, CKD III, a-fib, arthritis.   Clinical Impression   Pt with decline in function and safety with decreased strength, balance and endurance. Pt fatigues easily with DOE with minimal exertion. Pt lives at [facility] where skilled rehab can be provided upon arrival back to facility. PTA pt's baseline leevl of function was assist with ADLs, except toileting and pt used a 4W RW for mobility. All education completed and no further acute OT is indicated at this time. Defer further OT intervention to SNF at ALF    Follow Up Recommendations  SNF;Supervision/Assistance - 24 hour    Equipment Recommendations  None recommended by OT    Recommendations for Other Services       Precautions / Restrictions Precautions Precautions: Fall Restrictions Weight Bearing Restrictions: No      Mobility Bed Mobility Overal bed mobility: Needs Assistance Bed Mobility: Supine to Sit;Sit to Supine     Supine to sit: Min guard Sit to supine: Mod assist   General bed mobility comments: Significant increased time and effort to come to sit and scoot hips towards EOB; educ on technique for scooting. Easily fatigued with DOE up to 3/4, requiring multiple rest breaks during bed mobility  Transfers Overall transfer level: Needs assistance Equipment used: Rolling walker (2 wheeled) Transfers: Sit to/from Stand Sit to Stand: Mod assist         General transfer comment: Pt stood from elevated bed with RW and modA to assist trunk elevation; cues to maintain upright posture    Balance Overall balance assessment: Needs assistance Sitting-balance support: No upper extremity supported;Feet  supported Sitting balance-Leahy Scale: Fair     Standing balance support: During functional activity;Bilateral upper extremity supported;Single extremity supported Standing balance-Leahy Scale: Poor                             ADL either performed or assessed with clinical judgement   ADL                                         General ADL Comments: pt requires extensive assist with all ADLs, min A with grooming at EOB. Even at baseline, pt receives asssit with ADLs, except toileting at his ALF. may require increased assist upon initial return     Vision Baseline Vision/History: Wears glasses Wears Glasses: At all times Patient Visual Report: No change from baseline       Perception     Praxis      Pertinent Vitals/Pain Pain Assessment: No/denies pain     Hand Dominance Right   Extremity/Trunk Assessment Upper Extremity Assessment Upper Extremity Assessment: Generalized weakness   Lower Extremity Assessment Lower Extremity Assessment: Defer to PT evaluation   Cervical / Trunk Assessment Cervical / Trunk Assessment: Kyphotic   Communication Communication Communication: HOH   Cognition Arousal/Alertness: Awake/alert Behavior During Therapy: WFL for tasks assessed/performed Overall Cognitive Status: No family/caregiver present to determine baseline cognitive functioning Area of Impairment: Following commands;Problem solving  Following Commands: Follows multi-step commands with increased time     Problem Solving: Slow processing;Requires verbal cues     General Comments       Exercises     Shoulder Instructions      Home Living Family/patient expects to be discharged to:: Assisted living                             Home Equipment: Walker - 2 wheels   Additional Comments: From [facility]      Prior Functioning/Environment Level of Independence: Needs assistance  Gait /  Transfers Assistance Needed: Pt reports mod indep amb short distances with RW. Has had increased difficulty walking and breathing lately, so more meals have been brought to him versus pt walking to dining room ADL's / Homemaking Assistance Needed: Requires assist from staff for all ADLs, except pt reports indep with toileting            OT Problem List: Decreased strength;Decreased activity tolerance;Decreased knowledge of use of DME or AE;Decreased safety awareness;Impaired balance (sitting and/or standing)      OT Treatment/Interventions:      OT Goals(Current goals can be found in the care plan section) Acute Rehab OT Goals Patient Stated Goal: Return to [facility] OT Goal Formulation: With patient  OT Frequency:     Barriers to D/C: Decreased caregiver support  pt lives at an ALF that can provided skilled rehab upon return       Co-evaluation              AM-PAC PT "6 Clicks" Daily Activity     Outcome Measure Help from another person eating meals?: None Help from another person taking care of personal grooming?: A Little Help from another person toileting, which includes using toliet, bedpan, or urinal?: Total Help from another person bathing (including washing, rinsing, drying)?: Total Help from another person to put on and taking off regular upper body clothing?: Total Help from another person to put on and taking off regular lower body clothing?: Total 6 Click Score: 11   End of Session Equipment Utilized During Treatment: Gait belt;Rolling walker  Activity Tolerance: Patient limited by fatigue Patient left: in bed;with call bell/phone within reach;with bed alarm set;with family/visitor present  OT Visit Diagnosis: Unsteadiness on feet (R26.81);Other abnormalities of gait and mobility (R26.89);Muscle weakness (generalized) (M62.81)                Time: 2951-8841 OT Time Calculation (min): 27 min Charges:  OT General Charges $OT Visit: 1 Visit OT  Evaluation $OT Eval Moderate Complexity: 1 Mod OT Treatments $Therapeutic Activity: 8-22 mins G-Codes:       Britt Bottom 12/28/2017, 12:20 PM

## 2017-12-28 NOTE — Consult Note (Addendum)
Cardiology Consultation:   Patient ID: AQUARIUS LATOUCHE; [id number]; 11/25/1920   Admit date: 12/26/2017 Date of Consult: 12/28/2017  Primary Care Provider: Crist Infante, MD Primary Cardiologist: Mertie Moores, MD Primary Electrophysiologist:  None   Patient Profile:   Patrick [name] is a 84 y.o. male with a PMH of HTN, persistent atrial fibrillation not on AC due to thrombocytopenia (follows with hematology outpatient), asymptomatic AAA, R iliac artery aneurysm, CKD stage III, who is being seen today for the evaluation of CHF at the request of Dr. Reesa Chew.  History of Present Illness:   Mr. Krager was in his usual state of health until a few days ago when he noted worsening LE edema limiting his ability to ambulate with a rolling walker. He feels his breathing is at baseline and has not changed in correlation with his LE edema. He reports being evaluated by his PCP a couple weeks ago and that his metolazone was discontinued at that time, however he does not recall the reason why it was stopped.He reports compliance with his daily po lasix.   Patient was last seen outpatient by cardiology 05/2017 and was felt to be doing well overall. He noted some SOB with walking but attributed this to his age. He denied orthopnea, PND, LE edema, dizziness, or syncope at that time. No medication changes occurred at this visit.   At the time of this interview, he reports stable breathing and improvement in his LE edema. He reports a poor appetite and generalized weakness. He reports orthopnea but denies PND, chest pain, dizziness, lightheadedness, or syncope.   Hospital course:  Labs today notable for hypernatremia, K 3.4, Cr 1.8, Hgb 14.2, PLT 65, BNP 683. Also with TSH 16.95, FT3 1.4, T3 49, FT4 1.07. CXR with pulmonary vascular congestion and pleural effusions. EKG with atrial fibrillation and IVCD. LE doppler negative for DVT. Echo with EF 55-60%, mild-mod mitral regurg, severe TR, marked right-sided HF. Patient  was started on IV lasix TID with good UOP, net -3.2L this admission and -1.4L in the past 24 hours. Weight down from 161 to 155lbs. Cardiology consulted for further recommendations.    Past Medical History:  Diagnosis Date  . AAA (abdominal aortic aneurysm) ([place])   . Adenomatous colon polyp 2004   TVA 04/2006  . Arthritis   . Atrial fibrillation ([place])   . Chronic respiratory failure ([place])   . CKD (chronic kidney disease), stage II   . Diverticulosis   . Dyspnea   . Hemorrhoids   . Hypercholesterolemia   . Hypertension   . Lower extremity edema   . Pleural effusion     Past Surgical History:  Procedure Laterality Date  . ESOPHAGOGASTRODUODENOSCOPY N/A 09/25/2016   Procedure: ESOPHAGOGASTRODUODENOSCOPY (EGD);  Surgeon: Irene Shipper, MD;  Location: [facility];  Service: Endoscopy;  Laterality: N/A;  . HERNIA REPAIR  Age 45  . TONSILLECTOMY     Very Young     Home Medications:  Prior to Admission medications   Medication Sig Start Date End Date Taking? Authorizing Provider  Abaloparatide (TYMLOS) 3120 MCG/1.56ML SOPN Inject 80 mcg into the skin daily.   Yes [provider]  ferrous sulfate 325 (65 FE) MG tablet Take 1 tablet (325 mg total) by mouth 3 (three) times daily with meals. Patient taking differently: Take 325 mg by mouth daily with breakfast.  09/26/16  Yes Rizwan, Eunice Blase, MD  furosemide (LASIX) 80 MG tablet Take 80 mg by mouth daily.   Yes [provider]  metolazone (ZAROXOLYN) 2.5 MG tablet Take 2.5 mg by mouth daily as needed (for weight gain of 3 pounds overnight or 5 pounds in a week).   Yes [provider]  metoprolol tartrate (LOPRESSOR) 25 MG tablet Take 12.5 mg by mouth 2 (two) times daily.  03/04/16  Yes [provider]    Inpatient Medications: Scheduled Meds: . ferrous sulfate  325 mg Oral Q breakfast  . furosemide  80 mg Intravenous Q8H  . sodium chloride flush  3 mL Intravenous Q12H   Continuous Infusions: . sodium  chloride     PRN Meds: sodium chloride, acetaminophen, ondansetron (ZOFRAN) IV, sodium chloride flush  Allergies:   No Known Allergies  Social History:   Social History   Socioeconomic History  . Marital status: Divorced    Spouse name: Not on file  . Number of children: 3  . Years of education: Not on file  . Highest education level: Not on file  Occupational History  . Occupation: retired    Fish farm manager: RETIRED  Social Needs  . Financial resource strain: Not on file  . Food insecurity:    Worry: Not on file    Inability: Not on file  . Transportation needs:    Medical: Not on file    Non-medical: Not on file  Tobacco Use  . Smoking status: Former Smoker    Types: Cigarettes    Last attempt to quit: 08/29/1969    Years since quitting: 48.3  . Smokeless tobacco: Never Used  Substance and Sexual Activity  . Alcohol use: Yes    Comment: glass of wine 1-2 times per week  . Drug use: No  . Sexual activity: Not on file  Lifestyle  . Physical activity:    Days per week: Not on file    Minutes per session: Not on file  . Stress: Not on file  Relationships  . Social connections:    Talks on phone: Not on file    Gets together: Not on file    Attends religious service: Not on file    Active member of club or organization: Not on file    Attends meetings of clubs or organizations: Not on file    Relationship status: Not on file  . Intimate partner violence:    Fear of current or ex partner: Not on file    Emotionally abused: Not on file    Physically abused: Not on file    Forced sexual activity: Not on file  Other Topics Concern  . Not on file  Social History Narrative  . Not on file    Family History:    Family History  Problem Relation Age of Onset  . Emphysema Father   . Diabetes Neg Hx   . Cancer Neg Hx      ROS:  Please see the history of present illness.   All other ROS reviewed and negative.     Physical Exam/Data:   Vitals:   12/27/17 1936  12/27/17 2155 12/28/17 0037 12/28/17 0552  BP: 112/66 116/64 (!) 124/54 102/61  Pulse: 93  67 63  Resp: 18  20 18   Temp: (!) 97.3 F (36.3 C)  (!) 97.3 F (36.3 C) (!) 97.4 F (36.3 C)  TempSrc: Oral  Oral Oral  SpO2: 96%  100% 96%  Weight:    155 lb 1.6 oz (70.4 kg)  Height:        Intake/Output Summary (Last 24 hours) at 12/28/2017 1321 Last  data filed at 12/28/2017 1019 Gross per 24 hour  Intake 465 ml  Output 3025 ml  Net -2560 ml   Filed Weights   12/26/17 2235 12/27/17 0550 12/28/17 0552  Weight: 161 lb 13.1 oz (73.4 kg) 160 lb 11.5 oz (72.9 kg) 155 lb 1.6 oz (70.4 kg)   Body mass index is 22.9 kg/m.  General:  Thin elderly gentleman laying in bed in no acute distress HEENT: sclera anicteric  Neck: +JVD Vascular: No carotid bruits; distal pulses 2+ bilaterally Cardiac:  normal S1, S2; RRR; + murmur, no gallops, or rubs Lungs: decreased breath sounds at bases b/l Abd: NABS, soft, nontender, no hepatomegaly Ext: 3+ b/l LE edema up to hips Musculoskeletal:  No deformities, BUE and BLE strength normal and equal Skin: warm and dry  Neuro:  CNs 2-12 intact, no focal abnormalities noted Psych:  Normal affect   EKG:  The EKG was personally reviewed and demonstrates:  Atrial fibrillation with IVCD Telemetry:  Telemetry was personally reviewed and demonstrates:  Atrial fibrillation with brief episode of NSVT  Relevant CV Studies:  Echocardiogram 12/27/17: Study Conclusions  - Left ventricle: Systolic function was normal. The estimated   ejection fraction was in the range of 55% to 60%. - Aortic valve: There was mild stenosis. Valve area (VTI): 1.16   cm^2. Valve area (Vmax): 1.12 cm^2. Valve area (Vmean): 1.17   cm^2. - Mitral valve: Calcified annulus. There was mild to moderate   regurgitation. - Left atrium: The atrium was moderately dilated. - Right ventricle: The cavity size was severely dilated. - Right atrium: The appendage was severely dilated. - Atrial septum:  No defect or patent foramen ovale was identified. - Tricuspid valve: There was severe regurgitation. - Impressions: Marked right sided failure.  Impressions: - Marked right sided failure.  Laboratory Data:  Chemistry Recent Labs  Lab 12/26/17 1517 12/27/17 0555 12/28/17 0552  NA 143 146* 147*  K 5.2* 3.2* 3.4*  CL 100* 99* 99*  CO2 31 33* 36*  GLUCOSE 142* 93 104*  BUN 76* 76* 73*  CREATININE 2.26* 2.16* 1.81*  CALCIUM 9.3 9.3 8.8*  GFRNONAA 23* 25* 30*  GFRAA 27* 28* 35*  ANIONGAP 12 14 12     Recent Labs  Lab 12/26/17 1517  PROT 5.9*  ALBUMIN 3.0*  AST 63*  ALT 20  ALKPHOS 201*  BILITOT 2.7*   Hematology Recent Labs  Lab 12/26/17 1517 12/28/17 0552  WBC 8.4 6.0  RBC 4.29 4.34  HGB 14.2 14.2  HCT 44.4 45.2  MCV 103.5* 104.1*  MCH 33.1 32.7  MCHC 32.0 31.4  RDW 18.4* 18.6*  PLT 80* 65*   Cardiac Enzymes Recent Labs  Lab 12/26/17 1727 12/26/17 2237 12/27/17 0555  TROPONINI <0.03 0.03* 0.04*    Recent Labs  Lab 12/26/17 1546  TROPIPOC 0.02    BNP Recent Labs  Lab 12/26/17 1517 12/28/17 0552  BNP 667.9* 683.4*    DDimer No results for input(s): DDIMER in the last 168 hours.  Radiology/Studies:  Dg Chest 2 View  Result Date: 12/26/2017 CLINICAL DATA:  Left leg swelling EXAM: CHEST - 2 VIEW COMPARISON:  None. FINDINGS: Cardiopericardial enlargement and haziness of the bilateral chest from layering pleural fluid that is small to moderate. There is a dense opacity at the medial right base, probable atelectasis given setting and margins. No mass was seen at the right base on a 09/28/2017 lumbar spine radiograph. Vascular pedicle widening and cephalized blood flow. IMPRESSION: Cardiopericardial enlargement, vascular congestion, and  pleural effusions obscuring much of the lower lungs. Electronically Signed   By: Monte Fantasia M.D.   On: 12/26/2017 16:24    Assessment and Plan:   1. Acute right-sided CHF/ acute on chronic diastolic CHF:  patient presented with worsening LE edema x3 days. BNP 683 today. CXR with pulmonary vascular congestion and pleural effusions. Echo with EF 55-60%, mild-mod mitral regurg, severe TR, severely dilated RA/RV, and marked right-sided HF. Patient was started on IV lasix TID with net -3.2L this admission and -1.4L in the past 24 hours. Weight 161>155lbs today. Still with significant LE edema and decreased breath sounds on exam. - Continue aggressive diuresis with IV lasix 80mg  TID - Would likely benefit from the addition of metolazone to promote diuretic effects.   2. Persistent atrial fibrillation: no complaints of palpitations and rate well controlled in the 50s-60. Not on anticoagulation due to thrombocytopenia. Metoprolol held this admission given bradycardia - Can continue to hold metoprolol   3. HTN: BP stable. Metoprolol on hold for bradycardia.  - Continue to monitor closely with diuresis  4. AAA: Last doppler with stable AAA - 3.3 x 3.3 cm - Continue annual monitoring and follow up with Vasc Sx - next 02/2018  5. R iliac artery aneurysm: Last doppler stable - 2.6 x 2.6 cm - Continue annual monitoring and follow up with Vasc Sx - next 02/2018  6. 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 - Continue to follow-up outpatient with hematology  7. CKD stage III: Cr 2.26 on admission; baseline ~1.3. Improved to 1.8 today - Continue to monitor closely with diuresis.  For questions or updates, please contact [place] Please consult [email or web address] for contact info under Cardiology/STEMI.   Signed, Abigail Butts, PA-C  12/28/2017 1:21 PM [phone]  The patient was seen, examined and discussed with Abigail Butts, PA-C  and I agree with the above.   84 y.o. male with a PMH of HTN, persistent atrial fibrillation not on AC due to thrombocytopenia (follows with hematology outpatient), asymptomatic AAA, R iliac artery aneurysm, CKD stage III, who is being seen  today for the evaluation of CHF , the patient is followed by Dr Acie Fredrickson, last seen in 05/2017 when he was doing well, no CHF on Lasix 80 mg po daily. He was admitted on 12/26/2017 with worsening LE edema limiting his ability to ambulate with a rolling walker. He feels his breathing is at baseline and has not changed in correlation with his LE edema. He reports being evaluated by his PCP a couple weeks ago and that his metolazone was discontinued at that time, however he does not recall the reason why it was stopped, also it is not clear who and when it was started.  BNP 700, CXR showed pulmonary edema, LE edema was significant. He was started on Lasix 80 mg iv Q8H with diuresis and negative 6 lbs. He was also diagnosed with an acute on chronic kidney failure and Crea 2.2 improved to 1.8 with diuresis, baseline in Jan 2019 1.3.  Today his lungs have minimal crackles, he continues to have some LE edema, I would decrease lasix to 80 mg iv BID. Reevaluate volume status tomorrow, follow Crea closely, replace K.  Of note, the patient has lost at least 15 lbs since October 2018, he reports having poor appetite and worsening mobility. He might be reaching end of life with rapid decline that has been seen in the last 6 months.   Ena Dawley, MD  12/28/2017  

## 2017-12-29 DIAGNOSIS — N170 Acute kidney failure with tubular necrosis: Secondary | ICD-10-CM

## 2017-12-29 DIAGNOSIS — E876 Hypokalemia: Secondary | ICD-10-CM

## 2017-12-29 LAB — BASIC METABOLIC PANEL
ANION GAP: 9 (ref 5–15)
BUN: 68 mg/dL — ABNORMAL HIGH (ref 6–20)
CALCIUM: 8.4 mg/dL — AB (ref 8.9–10.3)
CO2: 42 mmol/L — ABNORMAL HIGH (ref 22–32)
Chloride: 95 mmol/L — ABNORMAL LOW (ref 101–111)
Creatinine, Ser: 1.66 mg/dL — ABNORMAL HIGH (ref 0.61–1.24)
GFR, EST AFRICAN AMERICAN: 39 mL/min — AB (ref >60)
GFR, EST NON AFRICAN AMERICAN: 34 mL/min — AB (ref >60)
Glucose, Bld: 127 mg/dL — ABNORMAL HIGH (ref 65–99)
POTASSIUM: 3.1 mmol/L — AB (ref 3.5–5.1)
SODIUM: 146 mmol/L — AB (ref 135–145)

## 2017-12-29 LAB — MAGNESIUM: MAGNESIUM: 1.7 mg/dL (ref 1.7–2.4)

## 2017-12-29 LAB — CBC
HEMATOCRIT: 44 % (ref 39.0–52.0)
HEMOGLOBIN: 14.2 g/dL (ref 13.0–17.0)
MCH: 33.4 pg (ref 26.0–34.0)
MCHC: 32.3 g/dL (ref 30.0–36.0)
MCV: 103.5 fL — ABNORMAL HIGH (ref 78.0–100.0)
Platelets: 62 10*3/uL — ABNORMAL LOW (ref 150–400)
RBC: 4.25 MIL/uL (ref 4.22–5.81)
RDW: 18.4 % — ABNORMAL HIGH (ref 11.5–15.5)
WBC: 6.9 10*3/uL (ref 4.0–10.5)

## 2017-12-29 MED ORDER — MAGNESIUM OXIDE 400 (241.3 MG) MG PO TABS
800.0000 mg | ORAL_TABLET | Freq: Once | ORAL | Status: AC
  Administered 2017-12-29: 800 mg via ORAL
  Filled 2017-12-29: qty 2

## 2017-12-29 MED ORDER — POTASSIUM CHLORIDE CRYS ER 20 MEQ PO TBCR
40.0000 meq | EXTENDED_RELEASE_TABLET | Freq: Two times a day (BID) | ORAL | Status: AC
  Administered 2017-12-29 (×2): 40 meq via ORAL
  Filled 2017-12-29 (×2): qty 2

## 2017-12-29 MED ORDER — FUROSEMIDE 10 MG/ML IJ SOLN
40.0000 mg | Freq: Two times a day (BID) | INTRAMUSCULAR | Status: DC
  Administered 2017-12-29 – 2017-12-30 (×2): 40 mg via INTRAVENOUS
  Filled 2017-12-29 (×2): qty 4

## 2017-12-29 NOTE — Clinical Social Work Note (Addendum)
Insurance authorization still pending.  Dayton Scrape, [place] [phone]  2:52 pm Insurance authorization approved: [id number]. [place] will call Terrilyn Saver in the business office at [facility] with the rest of the authorization information.  Dayton Scrape, [place]

## 2017-12-29 NOTE — Progress Notes (Signed)
PROGRESS NOTE    Patrick [name]  SFS:[id number] DOB: 1920-11-06 DOA: 12/26/2017 PCP: Crist Infante, MD   Brief Narrative:   84 year old male with history of atrial fibrillation not on any Coumadin due to thrombocytopenia, abdominal aortic aneurysm, essential hypertension, arthritis, colonic polyp, CKD, chronic respiratory failure, CHF comes to the hospital for worsening shortness of breath and lower extremity edema.  Upon admission he was started on IV Lasix and echocardiogram was ordered.  He was also noted to be bradycardic therefore metoprolol was held.  Assessment & Plan:   Principal Problem:   Acute heart failure (HCC) Active Problems:   Atrial fibrillation (HCC)   HTN (hypertension)   Thrombocytopenia (HCC)   CKD (chronic kidney disease), stage II   Bradycardia   Pressure injury of skin  Acute respiratory distress, improved Pulmonary vascular congestion and pleural effusion; improving Right sided congestive heart failure, improved -Concerns for congestive heart failure, echocardiogram -ejection fraction 55 to 60%, mild to moderate mitral regurgitation, moderate to severely dilated cardiomyopathy, severe tricuspid regurgitation.  Marked right-sided heart failure.  Cardiology is following -Supplemental oxygen, daily weight, input and output - Continue IV diuretics- diuretic dose decreased to 40 mg twice daily by cardiology with plans to switch over to oral tomorrow. -Monitor electrolytes closely, replete potassium and magnesium as necessary. High TSH, Low t3,ft3.  -Patient was given potassium and magnesium repletion aggressively  Atrial fibrillation with slow rate-bradycardia - Metoprolol on hold due to bradycardia.  TSH is elevated - Not on Coumadin due to thrombocytopenia  History of thrombocytopenia -Appears to be chronic in nature.  Previously hematology has determined no plans for bone marrow biopsy unless if this drops below 50. Seen by Dr Lindi Adie in the past. Last seen in  Dec 2018  Iron deficiency anemia -Continue ferrous sulfate 325 mg orally daily  Essential HTN -Metoprolol on hold due to bradycardia  Chronic kidney disease stage III -Creatinine is improved to 1.6 with diuresis   Physical therapy and occupational therapy recommending skilled nursing facility due to generalized weakness and deconditioning.  Social worker is aware and can likely have that at friendd home [place]  DVT prophylaxis: SCDs Code Status: DNR Family Communication: None at bedside Disposition Plan: Continue 1 more day of IV diuretic, once switched over to oral over the next 24 hours he can be discharged  Consultants:   Cardiology consult  Procedures:   None  Antimicrobials:   None   Subjective: No complaints, feels a little better.   Review of Systems Otherwise negative except as per HPI, including: HEENT/EYES = negative for pain, redness, loss of vision, double vision, blurred vision, loss of hearing, sore throat, hoarseness, dysphagia Cardiovascular= negative for chest pain, palpitation, murmurs, lower extremity swelling Respiratory/lungs= negative for shortness of breath, cough, hemoptysis, wheezing, mucus production Gastrointestinal= negative for nausea, vomiting,, abdominal pain, melena, hematemesis Genitourinary= negative for Dysuria, Hematuria, Change in Urinary Frequency MSK = Negative for arthralgia, myalgias, Back Pain, Joint swelling  Neurology= Negative for headache, seizures, numbness, tingling  Psychiatry= Negative for anxiety, depression, suicidal and homocidal ideation Allergy/Immunology= Medication/Food allergy as listed  Skin= Negative for Rash, lesions, ulcers, itching    Objective: Vitals:   12/28/17 0037 12/28/17 0552 12/28/17 1936 12/29/17 0638  BP: (!) 124/54 102/61 117/64 (!) 100/52  Pulse: 67 63 68 63  Resp: 20 18 20 20   Temp: (!) 97.3 F (36.3 C) (!) 97.4 F (36.3 C) 97.8 F (36.6 C) 97.7 F (36.5 C)  TempSrc: Oral Oral Oral  Oral  SpO2: 100% 96% 98% 96%  Weight:  70.4 kg (155 lb 1.6 oz)  68 kg (150 lb)  Height:        Intake/Output Summary (Last 24 hours) at 12/29/2017 1150 Last data filed at 12/29/2017 1025 Gross per 24 hour  Intake 716 ml  Output 2050 ml  Net -1334 ml   Filed Weights   12/27/17 0550 12/28/17 0552 12/29/17 6387  Weight: 72.9 kg (160 lb 11.5 oz) 70.4 kg (155 lb 1.6 oz) 68 kg (150 lb)    Examination: Constitutional: NAD, calm, comfortable, elderly frail-appearing Eyes: PERRL, lids and conjunctivae normal ENMT: Mucous membranes are moist. Posterior pharynx clear of any exudate or lesions.Normal dentition.  Neck: normal, supple, no masses, no thyromegaly Respiratory: Diminished breath sounds at the bases Cardiovascular: Regular rate and rhythm, no murmurs / rubs / gallops. No extremity edema. 2+ pedal pulses. No carotid bruits.  Abdomen: no tenderness, no masses palpated. No hepatosplenomegaly. Bowel sounds positive.  Musculoskeletal: no clubbing / cyanosis. No joint deformity upper and lower extremities. Good ROM, no contractures. Normal muscle tone.  Skin: no rashes, lesions, ulcers. No induration Neurologic: CN 2-12 grossly intact. Sensation intact, DTR normal. Strength 5/5 in all 4.  Psychiatric: Normal judgment and insight. Alert and oriented x 3. Normal mood.   Data Reviewed:   CBC: Recent Labs  Lab 12/26/17 1517 12/28/17 0552 12/29/17 0717  WBC 8.4 6.0 6.9  NEUTROABS 6.3  --   --   HGB 14.2 14.2 14.2  HCT 44.4 45.2 44.0  MCV 103.5* 104.1* 103.5*  PLT 80* 65* 62*   Basic Metabolic Panel: Recent Labs  Lab 12/26/17 1517 12/27/17 0555 12/28/17 0552 12/29/17 0717  NA 143 146* 147* 146*  K 5.2* 3.2* 3.4* 3.1*  CL 100* 99* 99* 95*  CO2 31 33* 36* 42*  GLUCOSE 142* 93 104* 127*  BUN 76* 76* 73* 68*  CREATININE 2.26* 2.16* 1.81* 1.66*  CALCIUM 9.3 9.3 8.8* 8.4*  MG  --   --  1.9 1.7   GFR: Estimated Creatinine Clearance: 26.2 mL/min (A) (by C-G formula based on SCr  of 1.66 mg/dL (H)). Liver Function Tests: Recent Labs  Lab 12/26/17 1517  AST 63*  ALT 20  ALKPHOS 201*  BILITOT 2.7*  PROT 5.9*  ALBUMIN 3.0*   No results for input(s): LIPASE, AMYLASE in the last 168 hours. No results for input(s): AMMONIA in the last 168 hours. Coagulation Profile: No results for input(s): INR, PROTIME in the last 168 hours. Cardiac Enzymes: Recent Labs  Lab 12/26/17 1727 12/26/17 2237 12/27/17 0555  TROPONINI <0.03 0.03* 0.04*   BNP (last 3 results) No results for input(s): PROBNP in the last 8760 hours. HbA1C: No results for input(s): HGBA1C in the last 72 hours. CBG: No results for input(s): GLUCAP in the last 168 hours. Lipid Profile: No results for input(s): CHOL, HDL, LDLCALC, TRIG, CHOLHDL, LDLDIRECT in the last 72 hours. Thyroid Function Tests: Recent Labs    12/26/17 2237 12/27/17 1224  TSH 16.950*  --   FREET4  --  1.07  T3FREE  --  1.4*   Anemia Panel: No results for input(s): VITAMINB12, FOLATE, FERRITIN, TIBC, IRON, RETICCTPCT in the last 72 hours. Sepsis Labs: No results for input(s): PROCALCITON, LATICACIDVEN in the last 168 hours.  Recent Results (from the past 240 hour(s))  MRSA PCR Screening     Status: None   Collection Time: 12/27/17 10:06 PM  Result Value Ref Range Status   MRSA by PCR NEGATIVE  NEGATIVE Final    Comment:        The GeneXpert MRSA Assay (FDA approved for NASAL specimens only), is one component of a comprehensive MRSA colonization surveillance program. It is not intended to diagnose MRSA infection nor to guide or monitor treatment for MRSA infections. Performed at [facility], [place] [address] [place], [place] [address]          Radiology Studies: No results found.      Scheduled Meds: . ferrous sulfate  325 mg Oral Q breakfast  . furosemide  40 mg Intravenous Q12H  . potassium chloride  40 mEq Oral BID  . sodium chloride flush  3 mL Intravenous Q12H   Continuous  Infusions: . sodium chloride       LOS: 3 days    Time spent: 35 mins    Lindon Kiel Arsenio Loader, MD [facility] Pager [phone]   If 7PM-7AM, please contact night-coverage [email or web address] Password [email or web address] 12/29/2017, 11:50 AM

## 2017-12-29 NOTE — Progress Notes (Signed)
Paged md to make aware that patient wound is bleeding and has increased in size. Have changed dressing twice in the last 5 hours.

## 2017-12-29 NOTE — Consult Note (Signed)
[place] Nurse wound follow up Wound type: Blood-filled blister that has ruptured and small hematoma is in wound bed. I am assisted with my assessment and care for this patient by [facility] PA Claiborne Billings [place] and her assistance is appreciated. Measurement: 4cm x 3cm area  with 1cm elevation Wound VHQ:IONGE hematoma is evacuated, wound bed is pink, moist with loose epidermis re approximated over 75% of wound bed Drainage (amount, consistency, odor) serosanguinous Periwound: intact with ecchymotic areas Dressing procedure/placement/frequency:THe xeroform dressings are appropriate and will remain on a twice daily change schedule.  [place] nursing team will not follow, but will remain available to this patient, the nursing and medical teams.  Please re-consult if needed. Thanks, Maudie Flakes, MSN, RN, [place], Arther Abbott  Pager# [phone]

## 2017-12-29 NOTE — Progress Notes (Signed)
Progress Note  Patient Name: Patrick [name] Date of Encounter: 12/29/2017  Primary Cardiologist: Mertie Moores, MD   Subjective   He is feeling better, not back to normal yet.   Inpatient Medications    Scheduled Meds: . ferrous sulfate  325 mg Oral Q breakfast  . furosemide  80 mg Intravenous Q12H  . potassium chloride  40 mEq Oral BID  . sodium chloride flush  3 mL Intravenous Q12H   Continuous Infusions: . sodium chloride     PRN Meds: sodium chloride, acetaminophen, ondansetron (ZOFRAN) IV, sodium chloride flush   Vital Signs    Vitals:   12/28/17 0037 12/28/17 0552 12/28/17 1936 12/29/17 0638  BP: (!) 124/54 102/61 117/64 (!) 100/52  Pulse: 67 63 68 63  Resp: 20 18 20 20   Temp: (!) 97.3 F (36.3 C) (!) 97.4 F (36.3 C) 97.8 F (36.6 C) 97.7 F (36.5 C)  TempSrc: Oral Oral Oral Oral  SpO2: 100% 96% 98% 96%  Weight:  155 lb 1.6 oz (70.4 kg)  150 lb (68 kg)  Height:        Intake/Output Summary (Last 24 hours) at 12/29/2017 1110 Last data filed at 12/29/2017 1025 Gross per 24 hour  Intake 716 ml  Output 2050 ml  Net -1334 ml   Filed Weights   12/27/17 0550 12/28/17 0552 12/29/17 0354  Weight: 160 lb 11.5 oz (72.9 kg) 155 lb 1.6 oz (70.4 kg) 150 lb (68 kg)    Telemetry    A-fib rate controlled - Personally Reviewed  ECG    No new ECG - Personally Reviewed  Physical Exam  Sitting in chair GEN: No acute distress.  Cachectic. Neck: No JVD Cardiac: iRRR, 3/6 systolic murmurs, rubs, or gallops.  Respiratory: Clear to auscultation bilaterally. GI: Soft, nontender, non-distended  MS: trivial edema; bandage on the RLE wound.  Neuro:  Nonfocal  Psych: Normal affect   Labs    Chemistry Recent Labs  Lab 12/26/17 1517 12/27/17 0555 12/28/17 0552 12/29/17 0717  NA 143 146* 147* 146*  K 5.2* 3.2* 3.4* 3.1*  CL 100* 99* 99* 95*  CO2 31 33* 36* 42*  GLUCOSE 142* 93 104* 127*  BUN 76* 76* 73* 68*  CREATININE 2.26* 2.16* 1.81* 1.66*  CALCIUM 9.3  9.3 8.8* 8.4*  PROT 5.9*  --   --   --   ALBUMIN 3.0*  --   --   --   AST 63*  --   --   --   ALT 20  --   --   --   ALKPHOS 201*  --   --   --   BILITOT 2.7*  --   --   --   GFRNONAA 23* 25* 30* 34*  GFRAA 27* 28* 35* 39*  ANIONGAP 12 14 12 9      Hematology Recent Labs  Lab 12/26/17 1517 12/28/17 0552 12/29/17 0717  WBC 8.4 6.0 6.9  RBC 4.29 4.34 4.25  HGB 14.2 14.2 14.2  HCT 44.4 45.2 44.0  MCV 103.5* 104.1* 103.5*  MCH 33.1 32.7 33.4  MCHC 32.0 31.4 32.3  RDW 18.4* 18.6* 18.4*  PLT 80* 65* 62*    Cardiac Enzymes Recent Labs  Lab 12/26/17 1727 12/26/17 2237 12/27/17 0555  TROPONINI <0.03 0.03* 0.04*    Recent Labs  Lab 12/26/17 1546  TROPIPOC 0.02     BNP Recent Labs  Lab 12/26/17 1517 12/28/17 0552  BNP 667.9* 683.4*     DDimer  No results for input(s): DDIMER in the last 168 hours.     Patient Profile     84 y.o. male   Assessment & Plan    1. Acute right-sided CHF/ acute on chronic diastolic CHF: patient presented with worsening LE edema x3 days. BNP 683 today. CXR with pulmonary vascular congestion and pleural effusions. Echo with EF 55-60%, mild-mod mitral regurg, severe TR, severely dilated RA/RV, and marked right-sided HF. Patient was started on IV lasix TID with net -3.2L this admission and -1.4L in the past 24 hours. Weight 161>155lbs today. Still with significant LE edema and decreased breath sounds on exam. - Would likely benefit from the addition of metolazone to promote diuretic effects.  - He is almost euvolemic and now soft BP< I would decrease lasix to 40 mg iv BID till tomorrow, than switch to PO.   2. Persistent atrial fibrillation: no complaints of palpitations and rate well controlled in the 50s-60. Not on anticoagulation due to thrombocytopenia. Metoprolol held this admission given bradycardia - Can continue to hold metoprolol   3. HTN: BP stable. Metoprolol on hold for bradycardia.  - Continue to monitor closely with  diuresis  4. AAA: Last doppler with stable AAA - 3.3 x 3.3 cm - Continue annual monitoring and follow up with Vasc Sx - next 02/2018  5. R iliac artery aneurysm: Last doppler stable - 2.6 x 2.6 cm - Continue annual monitoring and follow up with Vasc Sx - next 02/2018  6. 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 - Continue to follow-up outpatient with hematology  7. CKD stage III: Cr 2.26 on admission; baseline ~1.3. Improved to 1.6 today - Continue to monitor closely with diuresis.  Of note, the patient has lost at least 20 lbs since October 2018, he reports having poor appetite and worsening mobility. He might be reaching end of life with rapid decline that has been seen in the last 6 months.  For questions or updates, please contact [place] Please consult [email or web address] for contact info under Cardiology/STEMI.      Signed, Ena Dawley, MD  12/29/2017, 11:10 AM

## 2017-12-30 DIAGNOSIS — J9610 Chronic respiratory failure, unspecified whether with hypoxia or hypercapnia: Secondary | ICD-10-CM

## 2017-12-30 DIAGNOSIS — R600 Localized edema: Secondary | ICD-10-CM

## 2017-12-30 LAB — CBC
HCT: 44.2 % (ref 39.0–52.0)
Hemoglobin: 13.8 g/dL (ref 13.0–17.0)
MCH: 32.3 pg (ref 26.0–34.0)
MCHC: 31.2 g/dL (ref 30.0–36.0)
MCV: 103.5 fL — AB (ref 78.0–100.0)
PLATELETS: 54 10*3/uL — AB (ref 150–400)
RBC: 4.27 MIL/uL (ref 4.22–5.81)
RDW: 18.3 % — AB (ref 11.5–15.5)
WBC: 6.2 10*3/uL (ref 4.0–10.5)

## 2017-12-30 LAB — BASIC METABOLIC PANEL
Anion gap: 12 (ref 5–15)
BUN: 61 mg/dL — AB (ref 6–20)
CHLORIDE: 92 mmol/L — AB (ref 101–111)
CO2: 42 mmol/L — AB (ref 22–32)
CREATININE: 1.54 mg/dL — AB (ref 0.61–1.24)
Calcium: 8.5 mg/dL — ABNORMAL LOW (ref 8.9–10.3)
GFR calc Af Amer: 43 mL/min — ABNORMAL LOW (ref >60)
GFR calc non Af Amer: 37 mL/min — ABNORMAL LOW (ref >60)
Glucose, Bld: 95 mg/dL (ref 65–99)
Potassium: 3.6 mmol/L (ref 3.5–5.1)
SODIUM: 146 mmol/L — AB (ref 135–145)

## 2017-12-30 LAB — MAGNESIUM: Magnesium: 1.9 mg/dL (ref 1.7–2.4)

## 2017-12-30 NOTE — Progress Notes (Signed)
Patient resting comfortably during shift report. Denies complaints.  

## 2017-12-30 NOTE — Clinical Social Work Placement (Signed)
   CLINICAL SOCIAL WORK PLACEMENT  NOTE [facility]  Date:  12/30/2017  Patient Details  Name: Patrick [name] MRN: [id number] Date of Birth: Oct 30, 1920  Clinical Social Work is seeking post-discharge placement for this patient at the [place] level of care (*CSW will initial, date and re-position this form in  chart as items are completed):      Patient/family provided with [place] Work Department's list of facilities offering this level of care within the geographic area requested by the patient (or if unable, by the patient's family).  Yes   Patient/family informed of their freedom to choose among providers that offer the needed level of care, that participate in Medicare, Medicaid or managed care program needed by the patient, have an available bed and are willing to accept the patient.      Patient/family informed of [facility]'s ownership interest in [facility] and [facility], as well as of the fact that they are under no obligation to receive care at these facilities.  PASRR submitted to EDS on       PASRR number received on 12/27/17     Existing PASRR number confirmed on       FL2 transmitted to all facilities in geographic area requested by pt/family on 12/27/17     FL2 transmitted to all facilities within larger geographic area on       Patient informed that his/her managed care company has contracts with or will negotiate with certain facilities, including the following:        Yes   Patient/family informed of bed offers received.  Patient chooses bed at [facility]     Physician recommends and patient chooses bed at      Patient to be transferred to [facility] on 12/30/17.  Patient to be transferred to facility by [facility]     Patient family notified on 12/30/17 of transfer.  Name of family member notified:  friend [name], and sons Octavia Heir     PHYSICIAN       Additional Comment:     _______________________________________________ Alexander Mt, [place] 12/30/2017, 12:23 PM

## 2017-12-30 NOTE — Progress Notes (Addendum)
Report called to [facility] [phone]).    Call placed to [facility] to notify of telemetry monitoring d/c.

## 2017-12-30 NOTE — Progress Notes (Signed)
Call received from [facility], stating pt is discharging to [place], so report does in fact need to be called to [facility] as initially attempted but denied by desk at facility.

## 2017-12-30 NOTE — Progress Notes (Signed)
Call placed to [facility] again to give report. [facility] arriving on 3 east to pick patient up simultaneously.   Report given to [facility].

## 2017-12-30 NOTE — Progress Notes (Signed)
Pt d/c via stretcher with [facility]. Report completed. pts belongings on stretcher with him.

## 2017-12-30 NOTE — Discharge Summary (Signed)
Physician Discharge Summary  Patrick [name] WHQ:[id number] DOB: February 26, 1921 DOA: 12/26/2017  PCP: Crist Infante, MD  Admit date: 12/26/2017 Discharge date: 12/30/2017  Admitted From: Home  Disposition:  skilled nursing facility  Recommendations for Outpatient Follow-up:  1. Follow up with PCP in 1 weeks 2. Please obtain BMP/CBC in one week your next doctors visit.  3. Resume oral Lasix. 4. Wound care instructions-xeroform dressings are appropriate and will remain on a twice daily change schedule 5. Follow-up with outpatient cardiology Dr Acie Fredrickson in 2-3 weeks.  6. If his Platelets drop <50, he will need to see Dr Lindi Adie from heme/onc  Discharge Condition: Stable CODE STATUS: DO NOT RESUSCITATE Diet recommendation: Cardiac diet  Brief/Interim Summary: 84 year old male with history of atrial fibrillation not on any Coumadin due to thrombocytopenia, abdominal aortic aneurysm, essential hypertension, arthritis, colonic polyp, CKD, chronic respiratory failure, CHF comes to the hospital for worsening shortness of breath and lower extremity edema.  Upon admission he was started on IV Lasix and echocardiogram was ordered.  He was also noted to be bradycardic therefore metoprolol was held. Over the course of 3 days patient did well.  Echocardiogram showed ejection fraction 55 to 60% with concerns of severely dilated cardiomyopathy and marked right-sided heart failure.  Cardiology was consulted, patient was diuresed and eventually switched to oral Lasix. There is also concern of right lower extremity wound for which wound care team was following. Eventually patient was evaluated by physical therapy who recommended patient would benefit from skilled nursing facility. Today patient has reached maximum benefit from an hospital stay therefore stable to be discharged with outpatient follow-up recommendations as stated above.   ROS: HEENT/EYES = negative for pain, redness, loss of vision, double vision, blurred  vision, loss of hearing, sore throat, hoarseness, dysphagia Cardiovascular= negative for chest pain, palpitation, murmurs, lower extremity swelling Respiratory/lungs= negative for shortness of breath, cough, hemoptysis, wheezing, mucus production Gastrointestinal= negative for nausea, vomiting,, abdominal pain, melena, hematemesis Genitourinary= negative for Dysuria, Hematuria, Change in Urinary Frequency MSK = Negative for arthralgia, myalgias, Back Pain, Joint swelling  Neurology= Negative for headache, seizures, numbness, tingling  Psychiatry= Negative for anxiety, depression, suicidal and homocidal ideation Allergy/Immunology= Medication/Food allergy as listed  Skin= Negative for Rash, lesions, ulcers, itching    Discharge Diagnoses:  Principal Problem:   Acute heart failure (HCC) Active Problems:   Atrial fibrillation (HCC)   HTN (hypertension)   Thrombocytopenia (HCC)   CKD (chronic kidney disease), stage II   Bradycardia   Pressure injury of skin   Acute respiratory distress, resolved Pulmonary vascular congestion and pleural effusion; improving Right sided congestive heart failure, improved -Concerns for congestive heart failure, echocardiogram -ejection fraction 55 to 60%, mild to moderate mitral regurgitation, moderate to severely dilated cardiomyopathy, severe tricuspid regurgitation.  Marked right-sided heart failure.  Cardiology is following -Supplemental oxygen, daily weight, input and output -Patient diuretics will be changed to p.o. Lasix.  He will need to follow-up with outpatient cardiology -Monitor electrolytes closely, replete potassium and magnesium as necessary. High TSH, Low t3,ft3.  -Potassium and mag being repleted as needed. Keep K >4 and Mg >2  Atrial fibrillation with slow rate-bradycardia - Metoprolol on hold due to bradycardia.  TSH is elevated - Not on Coumadin due to thrombocytopenia  History of thrombocytopenia -Appears to be chronic in  nature.  Previously hematology has determined no plans for bone marrow biopsy unless if this drops below 50. Seen by Dr Lindi Adie in the past. Last seen in Dec 2018  Iron deficiency anemia -Continue ferrous sulfate 325 mg orally daily  Essential HTN -Metoprolol on hold due to bradycardia  Chronic kidney disease stage III -Creatinine is improved to 1.6 with diuresis   PT/OT rec- SNF    Discharge Instructions   Allergies as of 12/30/2017   No Known Allergies     Medication List    TAKE these medications   ferrous sulfate 325 (65 FE) MG tablet Take 1 tablet (325 mg total) by mouth 3 (three) times daily with meals. What changed:  when to take this   furosemide 80 MG tablet Commonly known as:  LASIX Take 80 mg by mouth daily.   metolazone 2.5 MG tablet Commonly known as:  ZAROXOLYN Take 2.5 mg by mouth daily as needed (for weight gain of 3 pounds overnight or 5 pounds in a week).   metoprolol tartrate 25 MG tablet Commonly known as:  LOPRESSOR Take 12.5 mg by mouth 2 (two) times daily.   TYMLOS 3120 MCG/1.56ML Sopn Generic drug:  Abaloparatide Inject 80 mcg into the skin daily.      Follow-up Information    Crist Infante, MD. Schedule an appointment as soon as possible for a visit in 1 week(s).   Specialty:  Internal Medicine Contact information: [place] [address] [phone]        Nahser, Wonda Cheng, MD. Schedule an appointment as soon as possible for a visit in 2 week(s).   Specialty:  Cardiology Contact information: [place] [address] [place] [address] [phone]          No Known Allergies  You were cared for by a hospitalist during your hospital stay. If you have any questions about your discharge medications or the care you received while you were in the hospital after you are discharged, you can call the unit and asked to speak with the hospitalist on call if the hospitalist that took care of you is not  available. Once you are discharged, your primary care physician will handle any further medical issues. Please note that no refills for any discharge medications will be authorized once you are discharged, as it is imperative that you return to your primary care physician (or establish a relationship with a primary care physician if you do not have one) for your aftercare needs so that they can reassess your need for medications and monitor your lab values.  Consultations:  Cardiology   Procedures/Studies: Dg Chest 2 View  Result Date: 12/26/2017 CLINICAL DATA:  Left leg swelling EXAM: CHEST - 2 VIEW COMPARISON:  None. FINDINGS: Cardiopericardial enlargement and haziness of the bilateral chest from layering pleural fluid that is small to moderate. There is a dense opacity at the medial right base, probable atelectasis given setting and margins. No mass was seen at the right base on a 09/28/2017 lumbar spine radiograph. Vascular pedicle widening and cephalized blood flow. IMPRESSION: Cardiopericardial enlargement, vascular congestion, and pleural effusions obscuring much of the lower lungs. Electronically Signed   By: Monte Fantasia M.D.   On: 12/26/2017 16:24      Subjective: Yesterday evening his blood filled blister in the right lower extremity had ruptured.  Wound care team had seen the patient who cleaned it out and reapplied the dressing.  Study.  Discharge Exam: Vitals:   12/29/17 1924 12/30/17 0545  BP: (!) 104/55 113/62  Pulse: (!) 54 (!) 48  Resp: 16 18  Temp: (!) 97.4 F (36.3 C) 97.6 F (36.4 C)  SpO2:  95% 96%   Vitals:   12/29/17 0638 12/29/17 1213 12/29/17 1924 12/30/17 0545  BP: (!) 100/52 (!) 102/46 (!) 104/55 113/62  Pulse: 63 62 (!) 54 (!) 48  Resp: 20 20 16 18   Temp: 97.7 F (36.5 C) 97.8 F (36.6 C) (!) 97.4 F (36.3 C) 97.6 F (36.4 C)  TempSrc: Oral Oral  Oral  SpO2: 96% 95% 95% 96%  Weight: 68 kg (150 lb)   68.4 kg (150 lb 12.7 oz)  Height:         General: Pt is alert, awake, not in acute distress Cardiovascular: RRR, S1/S2 +, no rubs, no gallops Respiratory: CTA bilaterally, no wheezing, no rhonchi Abdominal: Soft, NT, ND, bowel sounds + Extremities: RLE dressing in place.     The results of significant diagnostics from this hospitalization (including imaging, microbiology, ancillary and laboratory) are listed below for reference.     Microbiology: Recent Results (from the past 240 hour(s))  MRSA PCR Screening     Status: None   Collection Time: 12/27/17 10:06 PM  Result Value Ref Range Status   MRSA by PCR NEGATIVE NEGATIVE Final    Comment:        The GeneXpert MRSA Assay (FDA approved for NASAL specimens only), is one component of a comprehensive MRSA colonization surveillance program. It is not intended to diagnose MRSA infection nor to guide or monitor treatment for MRSA infections. Performed at [facility], [place] [address], [place], [place] [address]      Labs: BNP (last 3 results) Recent Labs    12/26/17 1517 12/28/17 0552  BNP 667.9* 191.4*   Basic Metabolic Panel: Recent Labs  Lab 12/26/17 1517 12/27/17 0555 12/28/17 0552 12/29/17 0717 12/30/17 0559  NA 143 146* 147* 146* 146*  K 5.2* 3.2* 3.4* 3.1* 3.6  CL 100* 99* 99* 95* 92*  CO2 31 33* 36* 42* 42*  GLUCOSE 142* 93 104* 127* 95  BUN 76* 76* 73* 68* 61*  CREATININE 2.26* 2.16* 1.81* 1.66* 1.54*  CALCIUM 9.3 9.3 8.8* 8.4* 8.5*  MG  --   --  1.9 1.7 1.9   Liver Function Tests: Recent Labs  Lab 12/26/17 1517  AST 63*  ALT 20  ALKPHOS 201*  BILITOT 2.7*  PROT 5.9*  ALBUMIN 3.0*   No results for input(s): LIPASE, AMYLASE in the last 168 hours. No results for input(s): AMMONIA in the last 168 hours. CBC: Recent Labs  Lab 12/26/17 1517 12/28/17 0552 12/29/17 0717 12/30/17 0559  WBC 8.4 6.0 6.9 6.2  NEUTROABS 6.3  --   --   --   HGB 14.2 14.2 14.2 13.8  HCT 44.4 45.2 44.0 44.2  MCV 103.5* 104.1* 103.5* 103.5*   PLT 80* 65* 62* 54*   Cardiac Enzymes: Recent Labs  Lab 12/26/17 1727 12/26/17 2237 12/27/17 0555  TROPONINI <0.03 0.03* 0.04*   BNP: Invalid input(s): POCBNP CBG: No results for input(s): GLUCAP in the last 168 hours. D-Dimer No results for input(s): DDIMER in the last 72 hours. Hgb A1c No results for input(s): HGBA1C in the last 72 hours. Lipid Profile No results for input(s): CHOL, HDL, LDLCALC, TRIG, CHOLHDL, LDLDIRECT in the last 72 hours. Thyroid function studies Recent Labs    12/27/17 1224  T3FREE 1.4*   Anemia work up No results for input(s): VITAMINB12, FOLATE, FERRITIN, TIBC, IRON, RETICCTPCT in the last 72 hours. Urinalysis    Component Value Date/Time   COLORURINE YELLOW 12/26/2017 [place] 12/26/2017  1534   LABSPEC 1.008 12/26/2017 1534   PHURINE 5.0 12/26/2017 1534   GLUCOSEU NEGATIVE 12/26/2017 1534   HGBUR NEGATIVE 12/26/2017 1534   BILIRUBINUR NEGATIVE 12/26/2017 1534   KETONESUR NEGATIVE 12/26/2017 1534   PROTEINUR NEGATIVE 12/26/2017 1534   NITRITE NEGATIVE 12/26/2017 1534   LEUKOCYTESUR NEGATIVE 12/26/2017 1534   Sepsis Labs Invalid input(s): PROCALCITONIN,  WBC,  LACTICIDVEN Microbiology Recent Results (from the past 240 hour(s))  MRSA PCR Screening     Status: None   Collection Time: 12/27/17 10:06 PM  Result Value Ref Range Status   MRSA by PCR NEGATIVE NEGATIVE Final    Comment:        The GeneXpert MRSA Assay (FDA approved for NASAL specimens only), is one component of a comprehensive MRSA colonization surveillance program. It is not intended to diagnose MRSA infection nor to guide or monitor treatment for MRSA infections. Performed at [facility], [place] [address] [place], [place] [address]      Time coordinating discharge:  I have spent 35 minutes face to face with the patient and on the ward discussing the patients care, assessment, plan and disposition with other care givers. >50% of the time was  devoted counseling the patient about the risks and benefits of treatment/Discharge disposition and coordinating care.   SIGNED:   Damita Lack, MD  [facility] 12/30/2017, 11:04 AM Pager   If 7PM-7AM, please contact night-coverage [email or web address] Password [email or web address]

## 2017-12-30 NOTE — Social Work (Addendum)
Clinical Social Worker facilitated patient discharge including contacting patient family and facility to confirm patient discharge plans.  Clinical information faxed to facility and family agreeable with plan.  CSW arranged ambulance transport via [facility] to [facility].  RN to call [phone] with report prior to discharge.  12:58pm- CSW spoke with RN aware number is not working, provided main number for [facility].   Clinical Social Worker will sign off for now as social work intervention is no longer needed. Please consult [place] again if new need arises.  Alexander Mt, [place] Social Worker

## 2017-12-30 NOTE — Progress Notes (Signed)
Two attempts to call report, number listed in CSW chart is not working x 2.

## 2018-01-01 ENCOUNTER — Non-Acute Institutional Stay (SKILLED_NURSING_FACILITY): Payer: Medicare Other | Admitting: Internal Medicine

## 2018-01-01 ENCOUNTER — Encounter: Payer: Self-pay | Admitting: Internal Medicine

## 2018-01-01 DIAGNOSIS — R600 Localized edema: Secondary | ICD-10-CM

## 2018-01-01 DIAGNOSIS — N183 Chronic kidney disease, stage 3 unspecified: Secondary | ICD-10-CM

## 2018-01-01 DIAGNOSIS — D696 Thrombocytopenia, unspecified: Secondary | ICD-10-CM | POA: Diagnosis not present

## 2018-01-01 DIAGNOSIS — I4819 Other persistent atrial fibrillation: Secondary | ICD-10-CM

## 2018-01-01 DIAGNOSIS — E870 Hyperosmolality and hypernatremia: Secondary | ICD-10-CM

## 2018-01-01 DIAGNOSIS — I481 Persistent atrial fibrillation: Secondary | ICD-10-CM

## 2018-01-01 DIAGNOSIS — J9610 Chronic respiratory failure, unspecified whether with hypoxia or hypercapnia: Secondary | ICD-10-CM

## 2018-01-01 DIAGNOSIS — R1310 Dysphagia, unspecified: Secondary | ICD-10-CM

## 2018-01-01 DIAGNOSIS — R2681 Unsteadiness on feet: Secondary | ICD-10-CM | POA: Diagnosis not present

## 2018-01-01 DIAGNOSIS — T148XXA Other injury of unspecified body region, initial encounter: Secondary | ICD-10-CM

## 2018-01-01 DIAGNOSIS — R6889 Other general symptoms and signs: Secondary | ICD-10-CM

## 2018-01-01 DIAGNOSIS — I10 Essential (primary) hypertension: Secondary | ICD-10-CM | POA: Diagnosis not present

## 2018-01-01 DIAGNOSIS — I50813 Acute on chronic right heart failure: Secondary | ICD-10-CM

## 2018-01-01 DIAGNOSIS — R5381 Other malaise: Secondary | ICD-10-CM

## 2018-01-01 NOTE — Progress Notes (Signed)
Provider:  Blanchie Serve MD  Location:  [facility] Room Number: 40 Place of Service:  SNF (31)  PCP: Crist Infante, MD Patient Care Team: Crist Infante, MD as PCP - General (Internal Medicine) Nahser, Wonda Cheng, MD as PCP - Cardiology (Cardiology) Ladene Artist, MD as Consulting Physician (Gastroenterology) Gardiner Barefoot, DPM as Consulting Physician (Podiatry)  Extended Emergency Contact Information Primary Emergency Contact: Karilyn Cota, [place] Phone: [phone] Relation: Friend  Code Status: DNR  Goals of Care: Advanced Directive information Advanced Directives 01/01/2018  Does Patient Have a Medical Advance Directive? Yes  Type of Paramedic of [place];Living will;Out of facility DNR (pink MOST or yellow form)  Does patient want to make changes to medical advance directive? No - Patient declined  Copy of [place] in Chart? Yes  Would patient like information on creating a medical advance directive? -  Pre-existing out of facility DNR order (yellow form or pink MOST form) Yellow form placed in chart (order not valid for inpatient use)      Chief Complaint  Patient presents with  . New Admit To SNF    New Admission Visit     HPI: Patient is a 84 y.o. male seen today for admission visit. He was in the hospital from 12/26/17-12/30/17 with worsening dyspnea and edema. In the ED he was found to have acute on chronic CHF exacerbation and bradycardia. His b blocker was held and he received iv diuresis and was admitted. Echocardiogram showed EF 55-60% with severely dilated cardiomyopathy and right heart failure. He was noted to have wound to his legs, left elbow and bottom. Wound care team was on board. He was later switched to po diuretic. He was discharged to SNF for rehabilitation. He is seen in his room today. He was residing at [facility] independent living  and following with PCP Perinni prior to this. Goal is for patient to return to his apartment after rehabilitation.   Past Medical History:  Diagnosis Date  . AAA (abdominal aortic aneurysm) ([place])   . Adenomatous colon polyp 2004   TVA 04/2006  . Arthritis   . Atrial fibrillation ([place])   . Chronic respiratory failure ([place])   . CKD (chronic kidney disease), stage II   . Diverticulosis   . Dyspnea   . Hemorrhoids   . Hypercholesterolemia   . Hypertension   . Lower extremity edema   . Pleural effusion    Past Surgical History:  Procedure Laterality Date  . ESOPHAGOGASTRODUODENOSCOPY N/A 09/25/2016   Procedure: ESOPHAGOGASTRODUODENOSCOPY (EGD);  Surgeon: Irene Shipper, MD;  Location: [facility];  Service: Endoscopy;  Laterality: N/A;  . HERNIA REPAIR  Age 88  . TONSILLECTOMY     Very Young    reports that he quit smoking about 48 years ago. His smoking use included cigarettes. He has never used smokeless tobacco. He reports that he drinks alcohol. He reports that he does not use drugs. Social History   Socioeconomic History  . Marital status: Divorced    Spouse name: Not on file  . Number of children: 3  . Years of education: Not on file  . Highest education level: Not on file  Occupational History  . Occupation: retired    Fish farm manager: RETIRED  Social Needs  . Financial resource strain: Not on file  . Food insecurity:    Worry: Not on  file    Inability: Not on file  . Transportation needs:    Medical: Not on file    Non-medical: Not on file  Tobacco Use  . Smoking status: Former Smoker    Types: Cigarettes    Last attempt to quit: 08/29/1969    Years since quitting: 48.3  . Smokeless tobacco: Never Used  Substance and Sexual Activity  . Alcohol use: Yes    Comment: glass of wine 1-2 times per week  . Drug use: No  . Sexual activity: Not on file  Lifestyle  . Physical activity:    Days per week: Not on file    Minutes per session: Not on file  . Stress: Not on file    Relationships  . Social connections:    Talks on phone: Not on file    Gets together: Not on file    Attends religious service: Not on file    Active member of club or organization: Not on file    Attends meetings of clubs or organizations: Not on file    Relationship status: Not on file  . Intimate partner violence:    Fear of current or ex partner: Not on file    Emotionally abused: Not on file    Physically abused: Not on file    Forced sexual activity: Not on file  Other Topics Concern  . Not on file  Social History Narrative  . Not on file    Functional Status Survey:    Family History  Problem Relation Age of Onset  . Emphysema Father   . Diabetes Neg Hx   . Cancer Neg Hx     Health Maintenance  Topic Date Due  . PNA vac Low Risk Adult (1 of 2 - PCV13) 08/07/1988  . INFLUENZA VACCINE  03/29/2018  . TETANUS/TDAP  06/30/2025    No Known Allergies  Outpatient Encounter Medications as of 01/01/2018  Medication Sig  . Abaloparatide (TYMLOS) 3120 MCG/1.56ML SOPN Inject 80 mcg into the skin daily.  . ferrous sulfate 325 (65 FE) MG tablet Take 1 tablet (325 mg total) by mouth 3 (three) times daily with meals.  . furosemide (LASIX) 80 MG tablet Take 80 mg by mouth daily.  . metolazone (ZAROXOLYN) 2.5 MG tablet Take 2.5 mg by mouth daily as needed (for weight gain of 3 pounds overnight or 5 pounds in a week).  . metoprolol tartrate (LOPRESSOR) 25 MG tablet Take 12.5 mg by mouth 2 (two) times daily.    No facility-administered encounter medications on file as of 01/01/2018.     Review of Systems  Constitutional: Positive for appetite change and fatigue. Negative for chills, diaphoresis and fever.       Appetite slowly returning  HENT: Positive for hearing loss, rhinorrhea and trouble swallowing. Negative for congestion, ear discharge, ear pain, mouth sores, postnasal drip, sinus pressure and sore throat.        Has trouble swallowing with solid food and medicine  Eyes:  Positive for visual disturbance. Negative for pain and discharge.       Wears corrective lenses  Respiratory: Positive for cough and shortness of breath. Negative for wheezing.        Occasional cough with meals. Has dyspnea more with exertion, oxygen by nasal canula helps  Cardiovascular: Positive for leg swelling. Negative for chest pain and palpitations.  Gastrointestinal: Negative for abdominal pain, constipation, diarrhea, nausea and vomiting.  Genitourinary: Negative for dysuria, frequency and hematuria.  Musculoskeletal: Positive for  gait problem. Negative for arthralgias and back pain.  Skin: Positive for wound.  Neurological: Positive for weakness. Negative for dizziness, seizures, syncope and headaches.  Hematological: Bruises/bleeds easily.  Psychiatric/Behavioral: Negative for behavioral problems, confusion and sleep disturbance. The patient is not nervous/anxious.     Vitals:   01/01/18 1135  BP: (!) 106/52  Pulse: 60  Resp: 20  Temp: 98.4 F (36.9 C)  TempSrc: Oral  SpO2: 91%  Weight: 150 lb 11.2 oz (68.4 kg)  Height: 5\' 9"  (1.753 m)   Body mass index is 22.25 kg/m.   Wt Readings from Last 3 Encounters:  01/01/18 150 lb 11.2 oz (68.4 kg)  12/30/17 150 lb 12.7 oz (68.4 kg)  08/23/17 159 lb 4.8 oz (72.3 kg)   Physical Exam  Constitutional: He is oriented to person, place, and time. He appears well-developed.  Frail, elderly male in no acute distress  HENT:  Head: Normocephalic and atraumatic.  Right Ear: External ear normal.  Left Ear: External ear normal.  Nose: Nose normal.  Mouth/Throat: Oropharynx is clear and moist. No oropharyngeal exudate.  Eyes: Pupils are equal, round, and reactive to light. Conjunctivae and EOM are normal. Right eye exhibits no discharge. Left eye exhibits no discharge.  Wears corrective glasses  Neck: Normal range of motion. Neck supple.  Cardiovascular: Normal rate and regular rhythm.  Weak dorsalis pedis  Pulmonary/Chest:  Effort normal. No respiratory distress. He has no wheezes. He has no rales.  Poor air movement bilaterally, normal effort, on 3 liters of oxygen by nasal canula  Abdominal: Soft. Bowel sounds are normal. There is no tenderness.  Musculoskeletal: He exhibits edema.  Able to move all 4 extremities, generalized weakness, trace right leg edema, 1+ left leg edema  Lymphadenopathy:    He has no cervical adenopathy.  Neurological: He is alert and oriented to person, place, and time.  Skin: Skin is warm and dry. Capillary refill takes 2 to 3 seconds. There is erythema.  Skin tear to RLE, LLE from ruptured blister per wound nurse note in hospital. At present has open skin area to RLE with bleed and serosanginous drainage. Open area to left elbow for some serous drainage and stain to foam dressing. Deep red erythema to both LE from hemosiderin deposit. Deep tissue pressure injury to sacrum and coccygeal area per wound nurse note from hospital. Bottom not visualized this visit. Bruise to both hands and arms  Psychiatric: He has a normal mood and affect. His behavior is normal.    Labs reviewed: Basic Metabolic Panel: Recent Labs    12/28/17 0552 12/29/17 0717 12/30/17 0559  NA 147* 146* 146*  K 3.4* 3.1* 3.6  CL 99* 95* 92*  CO2 36* 42* 42*  GLUCOSE 104* 127* 95  BUN 73* 68* 61*  CREATININE 1.81* 1.66* 1.54*  CALCIUM 8.8* 8.4* 8.5*  MG 1.9 1.7 1.9   Liver Function Tests: Recent Labs    12/26/17 1517  AST 63*  ALT 20  ALKPHOS 201*  BILITOT 2.7*  PROT 5.9*  ALBUMIN 3.0*   No results for input(s): LIPASE, AMYLASE in the last 8760 hours. No results for input(s): AMMONIA in the last 8760 hours. CBC: Recent Labs    05/23/17 1115 08/23/17 0826  12/26/17 1517 12/28/17 0552 12/29/17 0717 12/30/17 0559  WBC 4.8 6.7   < > 8.4 6.0 6.9 6.2  NEUTROABS 2.9 4.6  --  6.3  --   --   --   HGB 11.8* 13.1   < >  14.2 14.2 14.2 13.8  HCT 35.7* 40.5   < > 44.4 45.2 44.0 44.2  MCV 102.7* 101.0*    < > 103.5* 104.1* 103.5* 103.5*  PLT 57* 63*   < > 80* 65* 62* 54*   < > = values in this interval not displayed.   Cardiac Enzymes: Recent Labs    12/26/17 1727 12/26/17 2237 12/27/17 0555  TROPONINI <0.03 0.03* 0.04*   BNP: Invalid input(s): POCBNP No results found for: HGBA1C Lab Results  Component Value Date   TSH 16.950 (H) 12/26/2017   Lab Results  Component Value Date   VITAMINB12 584 09/25/2016   Lab Results  Component Value Date   FOLATE 20.2 09/24/2016   Lab Results  Component Value Date   IRON 34 (L) 09/24/2016   TIBC 389 09/24/2016   FERRITIN 43 09/24/2016    Imaging and Procedures obtained prior to SNF admission: Dg Chest 2 View  Result Date: 12/26/2017 CLINICAL DATA:  Left leg swelling EXAM: CHEST - 2 VIEW COMPARISON:  None. FINDINGS: Cardiopericardial enlargement and haziness of the bilateral chest from layering pleural fluid that is small to moderate. There is a dense opacity at the medial right base, probable atelectasis given setting and margins. No mass was seen at the right base on a 09/28/2017 lumbar spine radiograph. Vascular pedicle widening and cephalized blood flow. IMPRESSION: Cardiopericardial enlargement, vascular congestion, and pleural effusions obscuring much of the lower lungs. Electronically Signed   By: Monte Fantasia M.D.   On: 12/26/2017 16:24    Assessment/Plan  1. CKD (chronic kidney disease) stage 3, GFR 30-59 ml/min (HCC) On diuresis, monitor BMP  2. Hypernatremia Monitor BMP  3. Bilateral leg edema Keep legs elevated at rest, DVT ruled out with LLE> RLE edema. Continue lasix 80 mg daily and metolazone prn. Check bmp  4. Persistent atrial fibrillation (HCC) Continue metoprolol tartrate 12.5 mg bid. Off anticoagulation with thrombocytopenia  5. Acute on chronic right-sided heart failure ([place]) Reviewed echocardiogram. Continue b blocker, lasix and prn metolazone, daily weight for now, cardiology follow up, monitor  clinically  6. Essential hypertension Continue metoprolol tartrate with holding parameter with bradycardia in hospital  7. Chronic respiratory failure, unspecified whether with hypoxia or hypercapnia (HCC) Continue o2 by nasal canula, supportive care  8. Suspected deep tissue injury of unknown depth Wound care per facility protocol, gel overlay mattress  9. Multiple skin tears Skin care, geri sleeve to prevent future skin tears  10. Physical deconditioning Will have him work with physical therapy and occupational therapy team to help with gait training and muscle strengthening exercises.fall precautions. Skin care. Encourage to be out of bed.   11. Unsteady gait Will have patient work with PT/OT as tolerated to regain strength and restore function.  Fall precautions are in place.  12. Thrombocytopenia (HCC) Monitor platelet count with bleeding from his skin tear.   13. Dysphagia, unspecified type SLP consult to assess further    Family/ staff Communication: reviewed care plan with patient and charge nurse.    Labs/tests ordered: cbc, CMP 01/04/18  Blanchie Serve, MD Internal Medicine [facility] [address] [place], [place] [address] Cell Phone (Monday-Friday 8 am - 5 pm): [phone] On Call: [phone] and follow prompts after 5 pm and on weekends Office Phone: [phone] Office Fax: [phone]

## 2018-01-02 ENCOUNTER — Encounter: Payer: Self-pay | Admitting: Cardiology

## 2018-01-03 ENCOUNTER — Ambulatory Visit (INDEPENDENT_AMBULATORY_CARE_PROVIDER_SITE_OTHER): Payer: Medicare Other | Admitting: Cardiology

## 2018-01-03 ENCOUNTER — Encounter: Payer: Self-pay | Admitting: Cardiology

## 2018-01-03 VITALS — BP 102/52 | HR 60 | Ht 68.0 in | Wt 152.9 lb

## 2018-01-03 DIAGNOSIS — N182 Chronic kidney disease, stage 2 (mild): Secondary | ICD-10-CM

## 2018-01-03 DIAGNOSIS — I50813 Acute on chronic right heart failure: Secondary | ICD-10-CM

## 2018-01-03 DIAGNOSIS — I10 Essential (primary) hypertension: Secondary | ICD-10-CM

## 2018-01-03 DIAGNOSIS — I4819 Other persistent atrial fibrillation: Secondary | ICD-10-CM

## 2018-01-03 DIAGNOSIS — I481 Persistent atrial fibrillation: Secondary | ICD-10-CM

## 2018-01-03 NOTE — Progress Notes (Signed)
Cardiology Office Note:    Date:  01/03/2018   ID:  [name] Patrick, DOB [birth year], MRN [id number]  PCP:  Crist Infante, MD  Cardiologist:  Mertie Moores, MD  Referring MD: Crist Infante, MD   Chief Complaint  Patient presents with  . Follow-up    pt denies chest pains, swelling in hands/feet, some SOB noted.    History of Present Illness:    Patrick [name] is a 84 y.o. male with a past medical history significant for hypertension, persistent atrial fibrillation not on anticoagulation due to thrombocytopenia (follows with hematology outpatient), asymptomatic AAA, right iliac artery aneurysm, CK D stage III.   Mr. Patrick [name] was recently admitted to the hospital 12/26/17-12/30/17 with worsening dyspnea and edema. He was treated for acute on chronic CHF exacerbation and bradycardia. BNP was 683 and hest x-ray showed pulmonary vascular congestion and pleural effusions. His beta blocker was held and he was diuresed with IV Lasix. Echocardiogram showed EF 55-60 percent with severely dilated cardiomyopathy and right heart failure. He was noted to have wounds to his legs left elbow and bottom. He was discharged to a skilled nursing facility for rehabilitation at [facility]. He was discharged on Lasix 80 mg daily and metolazone 2.5 mg as needed for weight gain, which was his previous regimen prior to hospitalization.  Mr. Patrick [name] spends most of the day in a wheelchair or bed. He walks with PT with a rolling walker but does not walk on his own. He is using Oxygen which is not new for him.  He denies orthopnea or PND. He has mild lower extremity edema. His lower legs are wrapped and his wounds are being treated. He denies lightheadedness or feeling activity intolerance beyond his usual when he works with PT. He feels that he is doing well.  Wt at discharge was 150 lbs. Today wt was 152.9 this am at the facility and wt has been stable by daily wts at the facility.     Past Medical History:  Diagnosis Date   . AAA (abdominal aortic aneurysm) ([place])   . Adenomatous colon polyp 2004   TVA 04/2006  . Arthritis   . Atrial fibrillation ([place])   . Chronic respiratory failure ([place])   . CKD (chronic kidney disease), stage II   . Diverticulosis   . Dyspnea   . Hemorrhoids   . Hypercholesterolemia   . Hypertension   . Lower extremity edema   . Pleural effusion     Past Surgical History:  Procedure Laterality Date  . ESOPHAGOGASTRODUODENOSCOPY N/A 09/25/2016   Procedure: ESOPHAGOGASTRODUODENOSCOPY (EGD);  Surgeon: Irene Shipper, MD;  Location: [facility];  Service: Endoscopy;  Laterality: N/A;  . HERNIA REPAIR  Age 13  . TONSILLECTOMY     Very Young    Current Medications: Current Meds  Medication Sig  . Abaloparatide (TYMLOS) 3120 MCG/1.56ML SOPN Inject 80 mcg into the skin daily.  . ferrous sulfate 325 (65 FE) MG tablet Take 1 tablet (325 mg total) by mouth 3 (three) times daily with meals.  . furosemide (LASIX) 80 MG tablet Take 80 mg by mouth daily.  . metolazone (ZAROXOLYN) 2.5 MG tablet Take 2.5 mg by mouth daily as needed (for weight gain of 3 pounds overnight or 5 pounds in a week).  . metoprolol tartrate (LOPRESSOR) 25 MG tablet Take 12.5 mg by mouth 2 (two) times daily.      Allergies:   Patient has no known allergies.   Social History  Socioeconomic History  . Marital status: Divorced    Spouse name: Not on file  . Number of children: 3  . Years of education: Not on file  . Highest education level: Not on file  Occupational History  . Occupation: retired    Fish farm manager: RETIRED  Social Needs  . Financial resource strain: Not on file  . Food insecurity:    Worry: Not on file    Inability: Not on file  . Transportation needs:    Medical: Not on file    Non-medical: Not on file  Tobacco Use  . Smoking status: Former Smoker    Types: Cigarettes    Last attempt to quit: 08/29/1969    Years since quitting: 48.3  . Smokeless tobacco: Never Used  Substance and Sexual  Activity  . Alcohol use: Yes    Comment: glass of wine 1-2 times per week  . Drug use: No  . Sexual activity: Not on file  Lifestyle  . Physical activity:    Days per week: Not on file    Minutes per session: Not on file  . Stress: Not on file  Relationships  . Social connections:    Talks on phone: Not on file    Gets together: Not on file    Attends religious service: Not on file    Active member of club or organization: Not on file    Attends meetings of clubs or organizations: Not on file    Relationship status: Not on file  Other Topics Concern  . Not on file  Social History Narrative  . Not on file     Family History: The patient's family history includes Emphysema in his father. There is no history of Diabetes or Cancer. ROS:   Please see the history of present illness.     All other systems reviewed and are negative.  EKGs/Labs/Other Studies Reviewed:    The following studies were reviewed today:  Echocardiogram 12/27/17: Study Conclusions  - Left ventricle: Systolic function was normal. The estimated ejection fraction was in the range of 55% to 60%. - Aortic valve: There was mild stenosis. Valve area (VTI): 1.16 cm^2. Valve area (Vmax): 1.12 cm^2. Valve area (Vmean): 1.17 cm^2. - Mitral valve: Calcified annulus. There was mild to moderate regurgitation. - Left atrium: The atrium was moderately dilated. - Right ventricle: The cavity size was severely dilated. - Right atrium: The appendage was severely dilated. - Atrial septum: No defect or patent foramen ovale was identified. - Tricuspid valve: There was severe regurgitation. - Impressions: Marked right sided failure.  Impressions: - Marked right sided failure.     EKG:  EKG is not ordered today.   Recent Labs: 12/26/2017: ALT 20; TSH 16.950 12/28/2017: B Natriuretic Peptide 683.4 12/30/2017: BUN 61; Creatinine, Ser 1.54; Hemoglobin 13.8; Magnesium 1.9; Platelets 54; Potassium 3.6; Sodium 146     Recent Lipid Panel No results found for: CHOL, TRIG, HDL, CHOLHDL, VLDL, LDLCALC, LDLDIRECT  Physical Exam:    VS:  BP (!) 102/52 (BP Location: Left Arm)   Pulse 60   Ht 5\' 8"  (1.727 m)   Wt 210 lb 9.6 oz (95.5 kg) Comment: in wheelchair; Wt was 152.9 at the facility this morning  BMI 32.02 kg/m     Wt Readings from Last 3 Encounters:  01/03/18 152 lb 14.4 oz (69.4 kg)  01/01/18 150 lb 11.2 oz (68.4 kg)  12/30/17 150 lb 12.7 oz (68.4 kg)     Physical Exam  Constitutional: No  distress.  Frail, elderly male  HENT:  Head: Normocephalic and atraumatic.  Neck: Normal range of motion. Neck supple. No JVD present.  Cardiovascular: Normal rate and regular rhythm. Exam reveals no gallop and no friction rub.  Murmur heard.  Systolic murmur is present with a grade of 1/6 at the upper left sternal border. Vitals reviewed.    ASSESSMENT:    1. Acute on chronic right-sided heart failure (HCC)   2. Persistent atrial fibrillation ([place])   3. Essential hypertension   4. CKD (chronic kidney disease), stage II    PLAN:    In order of problems listed above:  Right-sided CHF/chronic diastolic dysfunction: recently hospitalized with volume overload and aggressively diuresed with IV Lasix and metolazone. Wt has been stable. Lower leg edema still present but improved, now wrapped. No dyspnea or orthopnea. Continue current therapy with lasix 80 mg daily and extra metolazone as needed.   Persistent atrial fibrillation: Not on anticoagulation due to thrombocytopenia. Metoprolol discontinued with recent hospitalization due to bradycardia but restarted at 12.5 mg bid on discharge. There are parameters to hold for SBP<110 and/or HR <60. Currenlty HR is 60 and he seems to be tolerating this well.   Hypertension: Well controlled, a little soft. No complaints of lightheadedness or excess fatigue. Continue current therapy.   CKD stage III: creatinine was 2.26 at Hospital at admission, baseline~1.3.  Creatinine was improved by discharge, 1.54. He has orders for lab work at [facility] for tomorrow.    Medication Adjustments/Labs and Tests Ordered: Current medicines are reviewed at length with the patient today.  Concerns regarding medicines are outlined above. Labs and tests ordered and medication changes are outlined in the patient instructions below:  Patient Instructions  Medication Instructions:  Your physician recommends that you continue on your current medications as directed. Please refer to the Current Medication list given to you today.   Labwork: None ordered  Testing/Procedures: None ordered  Follow-Up: Your physician wants you to follow-up with Dr. Acie Fredrickson on 05/03/18 at 3:40 PM.   Any Other Special Instructions Will Be Listed Below (If Applicable).     If you need a refill on your cardiac medications before your next appointment, please call your pharmacy.      Signed, Daune Perch, NP  01/03/2018 5:39 PM    [facility]

## 2018-01-03 NOTE — Patient Instructions (Signed)
Medication Instructions:  Your physician recommends that you continue on your current medications as directed. Please refer to the Current Medication list given to you today.   Labwork: None ordered  Testing/Procedures: None ordered  Follow-Up: Your physician wants you to follow-up with Dr. Acie Fredrickson on 05/03/18 at 3:40 PM.   Any Other Special Instructions Will Be Listed Below (If Applicable).     If you need a refill on your cardiac medications before your next appointment, please call your pharmacy.

## 2018-01-04 LAB — COMPLETE METABOLIC PANEL WITH GFR
ALT: 10
AST: 29
Albumin: 2.9
Alkaline Phosphatase: 217
BUN: 71 — AB (ref 4–21)
CREATININE: 1.73
Calcium: 9
EGFR (Non-African Amer.): 33
Glucose: 77
PROTEIN: 5.1
Potassium: 3.8
SODIUM: 141
Total Bilirubin: 2.8

## 2018-01-04 LAB — CBC
HCT: 37.7
HEMOGLOBIN: 12.7
MCV: 97.2
PLATELET COUNT: 13.8
WBC: 6.2

## 2018-01-05 ENCOUNTER — Non-Acute Institutional Stay (SKILLED_NURSING_FACILITY): Payer: Medicare Other | Admitting: Family

## 2018-01-05 ENCOUNTER — Encounter: Payer: Self-pay | Admitting: *Deleted

## 2018-01-05 ENCOUNTER — Encounter: Payer: Self-pay | Admitting: Family

## 2018-01-05 DIAGNOSIS — E441 Mild protein-calorie malnutrition: Secondary | ICD-10-CM | POA: Diagnosis not present

## 2018-01-05 DIAGNOSIS — I50812 Chronic right heart failure: Secondary | ICD-10-CM

## 2018-01-05 DIAGNOSIS — N183 Chronic kidney disease, stage 3 unspecified: Secondary | ICD-10-CM

## 2018-01-05 DIAGNOSIS — D696 Thrombocytopenia, unspecified: Secondary | ICD-10-CM

## 2018-01-05 DIAGNOSIS — E8809 Other disorders of plasma-protein metabolism, not elsewhere classified: Secondary | ICD-10-CM

## 2018-01-05 DIAGNOSIS — K5901 Slow transit constipation: Secondary | ICD-10-CM

## 2018-01-05 NOTE — Progress Notes (Signed)
Location:  [facility] Room Number: 40 Place of Service:  SNF [phone]) Provider: Dinah Ngetich FNP-C  Crist Infante, MD  Patient Care Team: Crist Infante, MD as PCP - General (Internal Medicine) Nahser, Wonda Cheng, MD as PCP - Cardiology (Cardiology) Ladene Artist, MD as Consulting Physician (Gastroenterology) Gardiner Barefoot, DPM as Consulting Physician (Podiatry)  Extended Emergency Contact Information Primary Emergency Contact: Karilyn Cota, [place] Phone: [phone] Relation: Friend  Code Status:  DNR Goals of care: Advanced Directive information Advanced Directives 01/01/2018  Does Patient Have a Medical Advance Directive? Yes  Type of Paramedic of [employer];Living will;Out of facility DNR (pink MOST or yellow form)  Does patient want to make changes to medical advance directive? No - Patient declined  Copy of [place] in Chart? Yes  Would patient like information on creating a medical advance directive? -  Pre-existing out of facility DNR order (yellow form or pink MOST form) Yellow form placed in chart (order not valid for inpatient use)     Chief Complaint  Patient presents with  . Acute Visit    abnormal labs    HPI:  Pt is a 84 y.o. [name] seen today at [facility] for an acute visit for evaluation of abnormal labs.He is seen in his room today.he denies any acute issues during visit.facility Nurse reports no new concerns.He continues to work with therapy.He still has some shortness of breath with exertion but overall thinks shortness of breath has improved.He was seen by cardiologist 01/03/2018 no new changes in medication.His lab resulted BUN 71,CR 1.73 ( previous BUN 61,CR 1.54 on 12/30/2017 ),TP 5.1,Alb 2.9,ALK phos 217,Total bilirubin 2.8 (previous alk 201,Total bilirubin 2.7 on 12/26/2017), PLts 54 previous 54 on 12/30/2017 ( 01/04/2018).    Past Medical History:   Diagnosis Date  . AAA (abdominal aortic aneurysm) ([place])   . Adenomatous colon polyp 2004   TVA 04/2006  . Arthritis   . Atrial fibrillation ([place])   . Chronic respiratory failure ([place])   . CKD (chronic kidney disease), stage II   . Diverticulosis   . Dyspnea   . Hemorrhoids   . Hypercholesterolemia   . Hypertension   . Lower extremity edema   . Pleural effusion    Past Surgical History:  Procedure Laterality Date  . ESOPHAGOGASTRODUODENOSCOPY N/A 09/25/2016   Procedure: ESOPHAGOGASTRODUODENOSCOPY (EGD);  Surgeon: Irene Shipper, MD;  Location: [facility];  Service: Endoscopy;  Laterality: N/A;  . HERNIA REPAIR  Age 70  . TONSILLECTOMY     Very Young    No Known Allergies  Outpatient Encounter Medications as of 01/05/2018  Medication Sig  . Abaloparatide (TYMLOS) 3120 MCG/1.56ML SOPN Inject 80 mcg into the skin daily.  . Amino Acids-Protein Hydrolys (FEEDING SUPPLEMENT, PRO-STAT SUGAR FREE 64,) LIQD Take 30 mLs by mouth 2 (two) times daily.  . ferrous sulfate 325 (65 FE) MG tablet Take 1 tablet (325 mg total) by mouth 3 (three) times daily with meals.  . furosemide (LASIX) 80 MG tablet Take 80 mg by mouth daily.  . metolazone (ZAROXOLYN) 2.5 MG tablet Take 2.5 mg by mouth daily as needed (for weight gain of 3 pounds overnight or 5 pounds in a week).  . metoprolol tartrate (LOPRESSOR) 25 MG tablet Take 12.5 mg by mouth 2 (two) times daily.    No facility-administered encounter medications on file as of 01/05/2018.  Review of Systems  Reason unable to perform ROS: additional information provided by facility Nurse   Constitutional: Negative for chills, fever and unexpected weight change.       Fair appetite   HENT: Positive for hearing loss. Negative for congestion, rhinorrhea, sinus pressure, sinus pain, sneezing and sore throat.   Eyes: Positive for visual disturbance. Negative for discharge, redness and itching.       Wears eye glasses   Respiratory: Negative for chest  tightness and wheezing.        Shortness of breath with exertion   Cardiovascular: Positive for leg swelling. Negative for chest pain and palpitations.  Gastrointestinal: Positive for constipation. Negative for abdominal distention, abdominal pain, diarrhea, nausea and vomiting.  Genitourinary: Negative for dysuria, flank pain, frequency and urgency.  Musculoskeletal: Positive for gait problem.  Skin: Negative for color change, pallor and rash.  Neurological: Negative for dizziness, light-headedness and headaches.  Psychiatric/Behavioral: Negative for agitation, confusion and sleep disturbance. The patient is not nervous/anxious.     Immunization History  Administered Date(s) Administered  . Tdap 07/01/2015   Pertinent  Health Maintenance Due  Topic Date Due  . PNA vac Low Risk Adult (1 of 2 - PCV13) 08/07/1988  . INFLUENZA VACCINE  03/29/2018   No flowsheet data found.  Vitals:   01/05/18 1207  BP: 114/68  Pulse: 60  Resp: 18  Temp: 97.9 F (36.6 C)  SpO2: 93%  Weight: 152 lb (68.9 kg)  Height: 5' 8"  (1.727 m)   Body mass index is 23.11 kg/m. Physical Exam  Constitutional:  Frail elderly in no acute distress   HENT:  Head: Normocephalic.  Mouth/Throat: Oropharynx is clear and moist. No oropharyngeal exudate.  Bilateral ear cerumen impaction TM not visualized  Eyes: Pupils are equal, round, and reactive to light. Conjunctivae and EOM are normal. Right eye exhibits no discharge. Left eye exhibits no discharge. No scleral icterus.  Eye glasses in place   Neck: Normal range of motion. No JVD present. No thyromegaly present.  Cardiovascular: Normal rate, regular rhythm and normal heart sounds. Exam reveals no gallop and no friction rub.  No murmur heard. Pulmonary/Chest: Effort normal. No respiratory distress. He has no wheezes. He has no rales.  Poor air entry.oxygen 3 liters via nasal cannula in place   Abdominal: Soft. Bowel sounds are normal. He exhibits no distension  and no mass. There is no tenderness. There is no rebound and no guarding.  Musculoskeletal: He exhibits no tenderness.  Moves x 4 extremities.unsteady gait uses walker.bilateral lower extremities 1+ edema.  Lymphadenopathy:    He has no cervical adenopathy.  Neurological: Gait abnormal.  Alert and oriented to person and place but not time  Skin: Skin is warm and dry. No rash noted. No erythema. No pallor.  Psychiatric: He has a normal mood and affect. His speech is normal and behavior is normal. Judgment and thought content normal.   Labs reviewed: Recent Labs    12/28/17 0552 12/29/17 0717 12/30/17 0559 01/04/18  NA 147* 146* 146* 141  K 3.4* 3.1* 3.6 3.8  CL 99* 95* 92*  --   CO2 36* 42* 42*  --   GLUCOSE 104* 127* 95  --   BUN 73* 68* 61* 71*  CREATININE 1.81* 1.66* 1.54* 1.73  CALCIUM 8.8* 8.4* 8.5* 9.0  MG 1.9 1.7 1.9  --    Recent Labs    12/26/17 1517 01/04/18  AST 63* 29  ALT 20 10  ALKPHOS 201* 217  BILITOT 2.7* 2.8  PROT 5.9*  --   ALBUMIN 3.0* 2.9   Recent Labs    05/23/17 1115 08/23/17 0826  12/26/17 1517 12/28/17 0552 12/29/17 0717 12/30/17 0559 01/04/18  WBC 4.8 6.7   < > 8.4 6.0 6.9 6.2 6.2  NEUTROABS 2.9 4.6  --  6.3  --   --   --   --   HGB 11.8* 13.1   < > 14.2 14.2 14.2 13.8 12.7  HCT 35.7* 40.5   < > 44.4 45.2 44.0 44.2 37.7  MCV 102.7* 101.0*   < > 103.5* 104.1* 103.5* 103.5* 97.2  PLT 57* 63*   < > 80* 65* 62* 54*  --    < > = values in this interval not displayed.   Lab Results  Component Value Date   TSH 16.950 (H) 12/26/2017    Significant Diagnostic Results in last 30 days:  Dg Chest 2 View  Result Date: 12/26/2017 CLINICAL DATA:  Left leg swelling EXAM: CHEST - 2 VIEW COMPARISON:  None. FINDINGS: Cardiopericardial enlargement and haziness of the bilateral chest from layering pleural fluid that is small to moderate. There is a dense opacity at the medial right base, probable atelectasis given setting and margins. No mass was seen  at the right base on a 09/28/2017 lumbar spine radiograph. Vascular pedicle widening and cephalized blood flow. IMPRESSION: Cardiopericardial enlargement, vascular congestion, and pleural effusions obscuring much of the lower lungs. Electronically Signed   By: Monte Fantasia M.D.   On: 12/26/2017 16:24  Assessment/Plan 1. Chronic right-sided heart failure Some shortness of breath with exertion and 1+ edema to lower extremities but has improved.no abrupt weight gain.currently on furosemide 80 mg tablet daily and metolazone 2.5 mg tablet daily as needed for weight gain of 3 lbs or 5 lbs in one week.Worsening renal function noted BUN 71 and CR 1.73 ( 01/04/2018) previous 61,CR 1.54 Reduce Furosemide to 40 mg tablet daily and 40 mg tablet daily for increased edema or weight gain > 3 lbs from 152 lbs.continue to monitor weight. Recheck CBC, BMP 01/08/2018.   2. CKD (chronic kidney disease) stage 3, GFR 30-59 ml/min (HCC) Worsening renal function noted BUN 71 and CR 1.73 ( 01/04/2018) previous 61,CR 1.54  3.  Mild Protein Malnutrition TP 5.1 ( 01/04/2018). Continue current protein supplements.follow up with facility Registered Dietician.monitor CMP.   4.  Hypoalbuminemia  Alb 2.9 (01/04/2018) continue present plan and monitor.   5. Thombocytopenia  Recent platelets 54 ( 01/04/2018) stable.previous  37.No signs of bleeding reported.continue to follow up with Hematology.   6. Constipation  Had small bowel movement but reports straining.add colace 100 mg capsule daily.Encourage hydration.   Family/ staff Communication: Reviewed plan of care with Dr. Bubba Camp, patient and facility Nurse.  Labs/tests ordered: CBC,CMP 01/08/2018.    Sandrea Hughs, NP

## 2018-01-08 LAB — CBC AND DIFFERENTIAL
HEMATOCRIT: 39 — AB (ref 41–53)
HEMOGLOBIN: 12.8 — AB (ref 13.5–17.5)
Platelets: 67 — AB (ref 150–399)
WBC: 4.6

## 2018-01-08 LAB — BASIC METABOLIC PANEL
BUN: 84 — AB (ref 4–21)
Creatinine: 1.5 — AB (ref 0.6–1.3)
Glucose: 87
POTASSIUM: 3.3 — AB (ref 3.4–5.3)
SODIUM: 146 (ref 137–147)

## 2018-01-09 ENCOUNTER — Non-Acute Institutional Stay (SKILLED_NURSING_FACILITY): Payer: Medicare Other | Admitting: Family

## 2018-01-09 ENCOUNTER — Encounter: Payer: Self-pay | Admitting: Family

## 2018-01-09 DIAGNOSIS — N183 Chronic kidney disease, stage 3 unspecified: Secondary | ICD-10-CM

## 2018-01-09 DIAGNOSIS — E876 Hypokalemia: Secondary | ICD-10-CM | POA: Diagnosis not present

## 2018-01-09 DIAGNOSIS — R600 Localized edema: Secondary | ICD-10-CM | POA: Diagnosis not present

## 2018-01-09 NOTE — Progress Notes (Signed)
Location:  [place] Room Number: 40 Place of Service:  SNF ([phone]) Provider: Amelianna Meller FNP-C  Crist Infante, MD  Patient Care Team: Crist Infante, MD as PCP - General (Internal Medicine) Nahser, Wonda Cheng, MD as PCP - Cardiology (Cardiology) Ladene Artist, MD as Consulting Physician (Gastroenterology) Gardiner Barefoot, DPM as Consulting Physician (Podiatry)  Extended Emergency Contact Information Primary Emergency Contact: Karilyn Cota, [place] Phone: [phone] Relation: Friend  Code Status:  DNR Goals of care: Advanced Directive information Advanced Directives 01/01/2018  Does Patient Have a Medical Advance Directive? Yes  Type of Paramedic of [place];Living will;Out of facility DNR (pink MOST or yellow form)  Does patient want to make changes to medical advance directive? No - Patient declined  Copy of [place] in Chart? Yes  Would patient like information on creating a medical advance directive? -  Pre-existing out of facility DNR order (yellow form or pink MOST form) Yellow form placed in chart (order not valid for inpatient use)     Chief Complaint  Patient presents with  . Acute Visit    abnormal lab results     HPI:  Pt is a 84 y.o. male seen today at [facility] for an acute visit for evaluation of abnormal lab results.He is seen in his room today.he denies any acute issues during visit.He continues to walk with therapy.He denies cough,increased shortness of breath or worsening edema.His lab work resulted BUN 84,CR 1.51 ( previous BUN 71,CR 1.73),K+ 3.3 (01/08/2018).His Furosemide was recently decreased to 40 mg tablet daily and 40 mg tablet for increased edema or weight gain > 3 lbs from baseline weight 152 lbs. Of note he has a significant medical history of chronic right-sided heart failure,Afib,chronic respiratory failure,CKD stage 3 among other  conditions.   Past Medical History:  Diagnosis Date  . AAA (abdominal aortic aneurysm) ([place])   . Adenomatous colon polyp 2004   TVA 04/2006  . Arthritis   . Atrial fibrillation ([place])   . Chronic respiratory failure ([place])   . CKD (chronic kidney disease), stage II   . Diverticulosis   . Dyspnea   . Hemorrhoids   . Hypercholesterolemia   . Hypertension   . Lower extremity edema   . Pleural effusion    Past Surgical History:  Procedure Laterality Date  . ESOPHAGOGASTRODUODENOSCOPY N/A 09/25/2016   Procedure: ESOPHAGOGASTRODUODENOSCOPY (EGD);  Surgeon: Irene Shipper, MD;  Location: [facility];  Service: Endoscopy;  Laterality: N/A;  . HERNIA REPAIR  Age 71  . TONSILLECTOMY     Very Young    No Known Allergies  Outpatient Encounter Medications as of 01/09/2018  Medication Sig  . Abaloparatide (TYMLOS) 3120 MCG/1.56ML SOPN Inject 80 mcg into the skin daily.  . Amino Acids-Protein Hydrolys (FEEDING SUPPLEMENT, PRO-STAT SUGAR FREE 64,) LIQD Take 30 mLs by mouth 2 (two) times daily.  . ferrous sulfate 325 (65 FE) MG tablet Take 1 tablet (325 mg total) by mouth 3 (three) times daily with meals.  . furosemide (LASIX) 80 MG tablet Take 40 mg by mouth daily. Furosemide 40 mg tablet one by mouth for increased edema or weight gain  3 lbs from 152 lbs as needed.  . metolazone (ZAROXOLYN) 2.5 MG tablet Take 2.5 mg by mouth daily as needed (for weight gain of 3 pounds overnight or 5 pounds in a week).  . metoprolol tartrate (  LOPRESSOR) 25 MG tablet Take 12.5 mg by mouth 2 (two) times daily.    No facility-administered encounter medications on file as of 01/09/2018.     Review of Systems  Constitutional: Negative for chills, fatigue, fever and unexpected weight change.  Respiratory: Negative for cough, chest tightness and wheezing.        Shortness of breath with exertion though stable.   Cardiovascular: Positive for leg swelling. Negative for chest pain and palpitations.  Gastrointestinal:  Negative for abdominal distention, abdominal pain, constipation, diarrhea, nausea and vomiting.  Genitourinary: Negative for dysuria, flank pain and urgency.  Musculoskeletal: Positive for gait problem.  Neurological: Negative for dizziness, light-headedness and headaches.  Hematological: Does not bruise/bleed easily.  Psychiatric/Behavioral: Negative for agitation and sleep disturbance. The patient is not nervous/anxious.     Immunization History  Administered Date(s) Administered  . Tdap 07/01/2015   Pertinent  Health Maintenance Due  Topic Date Due  . PNA vac Low Risk Adult (1 of 2 - PCV13) 08/07/1988  . INFLUENZA VACCINE  03/29/2018   No flowsheet data found. Functional Status Survey:    Vitals:   01/09/18 1107  BP: 108/64  Pulse: (!) 55  Resp: 18  Temp: (!) 97 F (36.1 C)  SpO2: 90%  Weight: 153 lb 1.6 oz (69.4 kg)  Height: 5\' 8"  (1.727 m)   Body mass index is 23.28 kg/m. Physical Exam  Constitutional:  Tall thin built elderly in no acute distress   HENT:  Head: Normocephalic.  Mouth/Throat: Oropharynx is clear and moist. No oropharyngeal exudate.  Eyes: Pupils are equal, round, and reactive to light. Conjunctivae are normal. Right eye exhibits no discharge. Left eye exhibits no discharge. No scleral icterus.  Neck: Normal range of motion. No thyromegaly present.  Cardiovascular: Normal rate, regular rhythm, normal heart sounds and intact distal pulses. Exam reveals no gallop and no friction rub.  No murmur heard. Pulmonary/Chest: Effort normal. No stridor. No respiratory distress. He has no wheezes. He has no rales.  Poor air entry.oxygen 2 liters via nasal cannula in place.  Abdominal: Soft. Bowel sounds are normal. He exhibits no distension and no mass. There is no tenderness. There is no rebound and no guarding.  Musculoskeletal: He exhibits no tenderness.  Unsteady gait uses Rolator.bilateral lower extremities edema 1-2+    Lymphadenopathy:    He has no  cervical adenopathy.  Neurological:  Alert and oriented to person and place  Skin: Skin is warm and dry. No rash noted. No erythema. No pallor.  Right lower extremity  and left forearm wound not visualized during visit dressing already changed by Nurse.Dressing dry and clean.  Psychiatric: He has a normal mood and affect. His behavior is normal.  Nursing note and vitals reviewed.   Labs reviewed: Recent Labs    12/28/17 0552 12/29/17 0717 12/30/17 0559 01/04/18 01/08/18  NA 147* 146* 146* 141 146  K 3.4* 3.1* 3.6 3.8 3.3*  CL 99* 95* 92*  --   --   CO2 36* 42* 42*  --   --   GLUCOSE 104* 127* 95  --   --   BUN 73* 68* 61* 71* 84*  CREATININE 1.81* 1.66* 1.54* 1.73 1.5*  CALCIUM 8.8* 8.4* 8.5* 9.0  --   MG 1.9 1.7 1.9  --   --    Recent Labs    12/26/17 1517 01/04/18  AST 63* 29  ALT 20 10  ALKPHOS 201* 217  BILITOT 2.7* 2.8  PROT 5.9*  --  ALBUMIN 3.0* 2.9   Recent Labs    05/23/17 1115 08/23/17 0826 12/26/17 1517  12/29/17 0717 12/30/17 0559 01/04/18 01/08/18  WBC 4.8 6.7 8.4   < > 6.9 6.2 6.2 4.6  NEUTROABS 2.9 4.6 6.3  --   --   --   --   --   HGB 11.8* 13.1 14.2   < > 14.2 13.8 12.7 12.8*  HCT 35.7* 40.5 44.4   < > 44.0 44.2 37.7 39*  MCV 102.7* 101.0* 103.5*   < > 103.5* 103.5* 97.2  --   PLT 57* 63* 80*   < > 62* 54*  --  67*   < > = values in this interval not displayed.   Lab Results  Component Value Date   TSH 16.950 (H) 12/26/2017   Significant Diagnostic Results in last 30 days:  Dg Chest 2 View  Result Date: 12/26/2017 CLINICAL DATA:  Left leg swelling EXAM: CHEST - 2 VIEW COMPARISON:  None. FINDINGS: Cardiopericardial enlargement and haziness of the bilateral chest from layering pleural fluid that is small to moderate. There is a dense opacity at the medial right base, probable atelectasis given setting and margins. No mass was seen at the right base on a 09/28/2017 lumbar spine radiograph. Vascular pedicle widening and cephalized blood flow.  IMPRESSION: Cardiopericardial enlargement, vascular congestion, and pleural effusions obscuring much of the lower lungs. Electronically Signed   By: Monte Fantasia M.D.   On: 12/26/2017 16:24   Assessment/Plan 1. Bilateral leg edema Edema has improved.No abrupt weight gain,shortness of breath or cough.will further reduce Furosemide from 40 mg tablet to 20 mg tablet daily.continue on Furosemide 40 mg tablet as needed for weight gain >3 pounds from baseline weight 152 lbs.continue on zaroxolyn 2.5 mg tablet as needed for weight gain or shortness of breath.    2. CKD (chronic kidney disease) stage 3, GFR 30-59 ml/min BUN 84,CR 1.51 ( previous BUN 71,CR 1.73) (01/08/2018).encouraged fluid intake.Reduce Furosemide as above. Recheck BMP 01/11/2018.   3. Hypokalemia K+ 3.3 (01/08/2018).furosemide adjusted as above.will order Potassium chloride 40 meq tablet x 1 dose now. Recheck BMP 01/11/2018.will consider initiating K-dur if potassium still low.   Family/ staff Communication: Reviewed plan of care with patient and facility Nurse/Supervisor.   Labs/tests ordered: BMP 01/11/2018  Nelda Bucks Rakeisha Nyce, NP

## 2018-01-11 ENCOUNTER — Encounter: Payer: Self-pay | Admitting: *Deleted

## 2018-01-11 LAB — BASIC METABOLIC PANEL
BUN: 84 — AB (ref 4–21)
CALCIUM: 8.8
CREATININE: 1.49
EGFR (Non-African Amer.): 40
GLUCOSE: 82
Potassium: 3.6
Sodium: 145

## 2018-01-15 ENCOUNTER — Non-Acute Institutional Stay (SKILLED_NURSING_FACILITY): Payer: Medicare Other | Admitting: Family

## 2018-01-15 ENCOUNTER — Encounter: Payer: Self-pay | Admitting: Family

## 2018-01-15 DIAGNOSIS — D696 Thrombocytopenia, unspecified: Secondary | ICD-10-CM

## 2018-01-15 DIAGNOSIS — R310 Gross hematuria: Secondary | ICD-10-CM

## 2018-01-15 DIAGNOSIS — S8012XA Contusion of left lower leg, initial encounter: Secondary | ICD-10-CM | POA: Diagnosis not present

## 2018-01-15 LAB — CBC
HCT: 38.9
HGB: 13
WBC: 5.3
platelet count: 88

## 2018-01-15 NOTE — Progress Notes (Signed)
Location:  [place] Room Number: 40 Place of Service:  SNF [phone]) Provider: Dinah Ngetich FNP-C  Crist Infante, MD  Patient Care Team: Crist Infante, MD as PCP - General (Internal Medicine) Nahser, Wonda Cheng, MD as PCP - Cardiology (Cardiology) Ladene Artist, MD as Consulting Physician (Gastroenterology) Gardiner Barefoot, DPM as Consulting Physician (Podiatry)  Extended Emergency Contact Information Primary Emergency Contact: Karilyn Cota, [place] Phone: [phone] Relation: Friend  Code Status:  DNR Goals of care: Advanced Directive information Advanced Directives 01/01/2018  Does Patient Have a Medical Advance Directive? Yes  Type of Paramedic of [place];Living will;Out of facility DNR (pink MOST or yellow form)  Does patient want to make changes to medical advance directive? No - Patient declined  Copy of [place] in Chart? Yes  Would patient like information on creating a medical advance directive? -  Pre-existing out of facility DNR order (yellow form or pink MOST form) Yellow form placed in chart (order not valid for inpatient use)     Chief Complaint  Patient presents with  . Acute Visit    hematuria    HPI:  Pt is a 84 y.o. male seen today at [facility] for an acute visit for evaluation of hematuria and left leg hematoma.Facility Nurse reports large bleeding hematoma to patient's left leg noted when patient was getting a shower.Old dressing to leg saturated with bright red blood.Patient also voided blood urine.He denies any fever,chills,back/abdominal pain,burning or itching with urination.he further denies any injuries to left leg.Of note he has a medical history of thrombocytopenia and follows up with Hematology oncologist Dr.Gudena.[facility] orders noted for patient to be seen by Hem/Oncologist if Platelets count are less than 50.Recent platelets   67 (01/08/2018).He denies any nose bleed or rectal bleeding.   Past Medical History:  Diagnosis Date  . AAA (abdominal aortic aneurysm) ([place])   . Adenomatous colon polyp 2004   TVA 04/2006  . Arthritis   . Atrial fibrillation ([place])   . Chronic respiratory failure ([place])   . CKD (chronic kidney disease), stage II   . Diverticulosis   . Dyspnea   . Hemorrhoids   . Hypercholesterolemia   . Hypertension   . Lower extremity edema   . Pleural effusion    Past Surgical History:  Procedure Laterality Date  . ESOPHAGOGASTRODUODENOSCOPY N/A 09/25/2016   Procedure: ESOPHAGOGASTRODUODENOSCOPY (EGD);  Surgeon: Irene Shipper, MD;  Location: [facility];  Service: Endoscopy;  Laterality: N/A;  . HERNIA REPAIR  Age 56  . TONSILLECTOMY     Very Young    No Known Allergies  Outpatient Encounter Medications as of 01/15/2018  Medication Sig  . Abaloparatide (TYMLOS) 3120 MCG/1.56ML SOPN Inject 80 mcg into the skin daily.  . Amino Acids-Protein Hydrolys (FEEDING SUPPLEMENT, PRO-STAT SUGAR FREE 64,) LIQD Take 30 mLs by mouth 2 (two) times daily.  Marland Kitchen docusate sodium (COLACE) 100 MG capsule Take 100 mg by mouth daily.  . ferrous sulfate 325 (65 FE) MG tablet Take 1 tablet (325 mg total) by mouth 3 (three) times daily with meals.  . furosemide (LASIX) 20 MG tablet Take 20 mg by mouth daily.  . furosemide (LASIX) 40 MG tablet Take 40 mg by mouth. Furosemide 40 mg tablet one by mouth for increased edema or weight gain  3 lbs from 152 lbs as needed.  . metolazone (ZAROXOLYN) 2.5 MG  tablet Take 2.5 mg by mouth daily as needed (for weight gain of 3 pounds overnight or 5 pounds in a week).  . metoprolol tartrate (LOPRESSOR) 25 MG tablet Take 12.5 mg by mouth 2 (two) times daily.   . [DISCONTINUED] furosemide (LASIX) 80 MG tablet Take 20 mg by mouth daily. Furosemide 40 mg tablet one by mouth for increased edema or weight gain  3 lbs from 152 lbs as needed.   No facility-administered encounter medications on  file as of 01/15/2018.     Review of Systems  Constitutional: Negative for appetite change, chills, fatigue and fever.  Respiratory: Negative for cough, shortness of breath and wheezing.   Cardiovascular: Positive for leg swelling. Negative for chest pain and palpitations.  Gastrointestinal: Negative for abdominal distention, abdominal pain, constipation, diarrhea, nausea and vomiting.  Genitourinary: Positive for hematuria. Negative for dysuria, flank pain, frequency and urgency.  Musculoskeletal: Positive for gait problem.  Skin: Negative for color change, pallor and rash.       Left leg hematoma   Neurological: Negative for dizziness, light-headedness and headaches.  Hematological: Bruises/bleeds easily.  Psychiatric/Behavioral: Negative for agitation, confusion and sleep disturbance. The patient is not nervous/anxious.     Immunization History  Administered Date(s) Administered  . Tdap 07/01/2015   Pertinent  Health Maintenance Due  Topic Date Due  . PNA vac Low Risk Adult (1 of 2 - PCV13) 08/07/1988  . INFLUENZA VACCINE  03/29/2018   No flowsheet data found.  Vitals:   01/15/18 1444  BP: (!) 106/54  Pulse: 80  Resp: 18  Temp: 97.6 F (36.4 C)  SpO2: 95%  Weight: 154 lb 4.8 oz (70 kg)  Height: 5\' 8"  (1.727 m)   Body mass index is 23.46 kg/m. Physical Exam  Constitutional:  Tall thin built elderly in no acute distress   HENT:  Head: Normocephalic.  Mouth/Throat: Oropharynx is clear and moist. No oropharyngeal exudate.  Eyes: Pupils are equal, round, and reactive to light. Conjunctivae and EOM are normal. Right eye exhibits no discharge. Left eye exhibits no discharge. No scleral icterus.  Cardiovascular: Normal rate, regular rhythm, normal heart sounds and intact distal pulses. Exam reveals no gallop and no friction rub.  No murmur heard. Pulmonary/Chest: Effort normal. No respiratory distress. He has no wheezes. He has no rales. He exhibits no tenderness.  Poor  air entry  Abdominal: Soft. Bowel sounds are normal. He exhibits no distension and no mass. There is no tenderness. There is no rebound and no guarding.  Musculoskeletal: He exhibits no tenderness.  Moves x 4 extremities.Bilateral lower extremities edema has improved.  Neurological: Gait abnormal.  Oriented to person and place   Skin: Skin is warm and dry. No rash noted. No erythema. No pallor.  10 X 9 X 5 cm blood hematoma with skin tear,bright red blood drainage noted.Non tender to touch.surrounding skin tissue without any signs of infection.   Psychiatric: He has a normal mood and affect. His behavior is normal.  Nursing note and vitals reviewed.   Labs reviewed: Recent Labs    12/28/17 0552 12/29/17 0717 12/30/17 0559 01/04/18 01/08/18 01/11/18  NA 147* 146* 146* 141 146 145  K 3.4* 3.1* 3.6 3.8 3.3* 3.6  CL 99* 95* 92*  --   --   --   CO2 36* 42* 42*  --   --   --   GLUCOSE 104* 127* 95  --   --   --   BUN 73* 68* 61* 71*  84* 84*  CREATININE 1.81* 1.66* 1.54* 1.73 1.5* 1.49  CALCIUM 8.8* 8.4* 8.5* 9.0  --  8.8  MG 1.9 1.7 1.9  --   --   --    Recent Labs    12/26/17 1517 01/04/18  AST 63* 29  ALT 20 10  ALKPHOS 201* 217  BILITOT 2.7* 2.8  PROT 5.9*  --   ALBUMIN 3.0* 2.9   Recent Labs    05/23/17 1115 08/23/17 0826 12/26/17 1517  12/29/17 0717 12/30/17 0559 01/04/18 01/08/18 01/15/18  WBC 4.8 6.7 8.4   < > 6.9 6.2 6.2 4.6 5.3  NEUTROABS 2.9 4.6 6.3  --   --   --   --   --   --   HGB 11.8* 13.1 14.2   < > 14.2 13.8 12.7 12.8* 13.0  HCT 35.7* 40.5 44.4   < > 44.0 44.2 37.7 39* 38.9  MCV 102.7* 101.0* 103.5*   < > 103.5* 103.5* 97.2  --   --   PLT 57* 63* 80*   < > 62* 54*  --  67*  --    < > = values in this interval not displayed.   Lab Results  Component Value Date   TSH 16.950 (H) 12/26/2017    Significant Diagnostic Results in last 30 days:  Dg Chest 2 View  Result Date: 12/26/2017 CLINICAL DATA:  Left leg swelling EXAM: CHEST - 2 VIEW COMPARISON:   None. FINDINGS: Cardiopericardial enlargement and haziness of the bilateral chest from layering pleural fluid that is small to moderate. There is a dense opacity at the medial right base, probable atelectasis given setting and margins. No mass was seen at the right base on a 09/28/2017 lumbar spine radiograph. Vascular pedicle widening and cephalized blood flow. IMPRESSION: Cardiopericardial enlargement, vascular congestion, and pleural effusions obscuring much of the lower lungs. Electronically Signed   By: Monte Fantasia M.D.   On: 12/26/2017 16:24    Assessment/Plan 1. Gross hematuria Asymptomatic.Afebrile.bright red blood no blood clots noted.has Hx of thrombocytopenia previous Plts 67 (01/08/2018).Will obtain stat CBC/diff and urine specimen for U/A and C/S rule out UTI.continue to monitor.   2. Leg hematoma, left, initial encounter Unknown cause.non-tender with thin skin tear well approximated.cleanse with saline,pat dry then apply steri-strips per facility skin tear protocol.cover hematoma with ABD pad change dressing every 3 days and as needed if soiled.continue to monitor for worsening symptoms or signs of infections.   3. Thrombocytopenia Recent Plts 67 (01/08/2018).check stat CBC/diff.will have him to be seen by Hematology oncology per hospital orders follow up with Dr.Gudena if Platelets are less than 50.continue to monitor for signs of bleeding.   Family/ staff Communication: Reviewed plan of care with patient and facility Nurse supervisor  Labs/tests ordered: Stat CBC/diff today.urine specimen for U/A and C/S rule out UTI  Addendum: Stat CBC/diff 01/15/2018 results shows stable Hgb/HCT and Platelets 88 will continue to monitor.Recheck CBC/diff 01/18/2018.   Sandrea Hughs, NP

## 2018-01-18 ENCOUNTER — Encounter: Payer: Self-pay | Admitting: *Deleted

## 2018-01-18 LAB — CBC
HCT: 36
HGB: 12.2
MCV: 96.8
PLATELET COUNT: 84
WBC: 7.1

## 2018-01-27 ENCOUNTER — Inpatient Hospital Stay (HOSPITAL_COMMUNITY)
Admission: EM | Admit: 2018-01-27 | Discharge: 2018-02-26 | DRG: 871 | Disposition: E | Payer: Medicare Other | Source: Skilled Nursing Facility | Attending: Family Medicine | Admitting: Family Medicine

## 2018-01-27 ENCOUNTER — Emergency Department (HOSPITAL_COMMUNITY): Payer: Medicare Other

## 2018-01-27 DIAGNOSIS — I4891 Unspecified atrial fibrillation: Secondary | ICD-10-CM | POA: Diagnosis present

## 2018-01-27 DIAGNOSIS — I130 Hypertensive heart and chronic kidney disease with heart failure and stage 1 through stage 4 chronic kidney disease, or unspecified chronic kidney disease: Secondary | ICD-10-CM | POA: Diagnosis present

## 2018-01-27 DIAGNOSIS — Z87891 Personal history of nicotine dependence: Secondary | ICD-10-CM | POA: Diagnosis not present

## 2018-01-27 DIAGNOSIS — R6520 Severe sepsis without septic shock: Secondary | ICD-10-CM | POA: Diagnosis present

## 2018-01-27 DIAGNOSIS — N182 Chronic kidney disease, stage 2 (mild): Secondary | ICD-10-CM | POA: Diagnosis present

## 2018-01-27 DIAGNOSIS — M170 Bilateral primary osteoarthritis of knee: Secondary | ICD-10-CM | POA: Diagnosis present

## 2018-01-27 DIAGNOSIS — Z860100 Personal history of colon polyps, unspecified: Secondary | ICD-10-CM

## 2018-01-27 DIAGNOSIS — Z79899 Other long term (current) drug therapy: Secondary | ICD-10-CM | POA: Diagnosis not present

## 2018-01-27 DIAGNOSIS — I481 Persistent atrial fibrillation: Secondary | ICD-10-CM | POA: Diagnosis not present

## 2018-01-27 DIAGNOSIS — Z515 Encounter for palliative care: Secondary | ICD-10-CM

## 2018-01-27 DIAGNOSIS — Z825 Family history of asthma and other chronic lower respiratory diseases: Secondary | ICD-10-CM | POA: Diagnosis not present

## 2018-01-27 DIAGNOSIS — I50813 Acute on chronic right heart failure: Secondary | ICD-10-CM | POA: Diagnosis present

## 2018-01-27 DIAGNOSIS — I714 Abdominal aortic aneurysm, without rupture, unspecified: Secondary | ICD-10-CM | POA: Diagnosis present

## 2018-01-27 DIAGNOSIS — Z9089 Acquired absence of other organs: Secondary | ICD-10-CM

## 2018-01-27 DIAGNOSIS — J9610 Chronic respiratory failure, unspecified whether with hypoxia or hypercapnia: Secondary | ICD-10-CM | POA: Diagnosis present

## 2018-01-27 DIAGNOSIS — E870 Hyperosmolality and hypernatremia: Secondary | ICD-10-CM | POA: Diagnosis present

## 2018-01-27 DIAGNOSIS — J810 Acute pulmonary edema: Secondary | ICD-10-CM | POA: Diagnosis not present

## 2018-01-27 DIAGNOSIS — J9621 Acute and chronic respiratory failure with hypoxia: Secondary | ICD-10-CM | POA: Diagnosis present

## 2018-01-27 DIAGNOSIS — Z66 Do not resuscitate: Secondary | ICD-10-CM | POA: Diagnosis present

## 2018-01-27 DIAGNOSIS — E785 Hyperlipidemia, unspecified: Secondary | ICD-10-CM

## 2018-01-27 DIAGNOSIS — A419 Sepsis, unspecified organism: Principal | ICD-10-CM

## 2018-01-27 DIAGNOSIS — D696 Thrombocytopenia, unspecified: Secondary | ICD-10-CM | POA: Diagnosis present

## 2018-01-27 DIAGNOSIS — N179 Acute kidney failure, unspecified: Secondary | ICD-10-CM | POA: Diagnosis present

## 2018-01-27 DIAGNOSIS — Z9981 Dependence on supplemental oxygen: Secondary | ICD-10-CM

## 2018-01-27 DIAGNOSIS — I447 Left bundle-branch block, unspecified: Secondary | ICD-10-CM | POA: Diagnosis present

## 2018-01-27 DIAGNOSIS — Z7189 Other specified counseling: Secondary | ICD-10-CM | POA: Diagnosis not present

## 2018-01-27 DIAGNOSIS — J181 Lobar pneumonia, unspecified organism: Secondary | ICD-10-CM | POA: Diagnosis present

## 2018-01-27 DIAGNOSIS — I10 Essential (primary) hypertension: Secondary | ICD-10-CM | POA: Diagnosis present

## 2018-01-27 LAB — DIFFERENTIAL
ABS IMMATURE GRANULOCYTES: 0 10*3/uL (ref 0.0–0.1)
Basophils Absolute: 0.1 10*3/uL (ref 0.0–0.1)
Basophils Relative: 1 %
Eosinophils Absolute: 0.1 10*3/uL (ref 0.0–0.7)
Eosinophils Relative: 3 %
Immature Granulocytes: 1 %
LYMPHS ABS: 0.5 10*3/uL — AB (ref 0.7–4.0)
LYMPHS PCT: 12 %
MONOS PCT: 15 %
Monocytes Absolute: 0.7 10*3/uL (ref 0.1–1.0)
Neutro Abs: 3.1 10*3/uL (ref 1.7–7.7)
Neutrophils Relative %: 68 %

## 2018-01-27 LAB — I-STAT CHEM 8, ED
BUN: 93 mg/dL — AB (ref 6–20)
CALCIUM ION: 0.84 mmol/L — AB (ref 1.15–1.40)
CHLORIDE: 104 mmol/L (ref 101–111)
CREATININE: 1.9 mg/dL — AB (ref 0.61–1.24)
GLUCOSE: 89 mg/dL (ref 65–99)
HCT: 41 % (ref 39.0–52.0)
Hemoglobin: 13.9 g/dL (ref 13.0–17.0)
POTASSIUM: 4.3 mmol/L (ref 3.5–5.1)
Sodium: 146 mmol/L — ABNORMAL HIGH (ref 135–145)
TCO2: 34 mmol/L — ABNORMAL HIGH (ref 22–32)

## 2018-01-27 LAB — COMPREHENSIVE METABOLIC PANEL
ALK PHOS: 194 U/L — AB (ref 38–126)
ALT: 16 U/L — ABNORMAL LOW (ref 17–63)
AST: 47 U/L — AB (ref 15–41)
Albumin: 2.8 g/dL — ABNORMAL LOW (ref 3.5–5.0)
Anion gap: 13 (ref 5–15)
BILIRUBIN TOTAL: 3.1 mg/dL — AB (ref 0.3–1.2)
BUN: 109 mg/dL — AB (ref 6–20)
CALCIUM: 8.4 mg/dL — AB (ref 8.9–10.3)
CO2: 34 mmol/L — ABNORMAL HIGH (ref 22–32)
CREATININE: 2.29 mg/dL — AB (ref 0.61–1.24)
Chloride: 103 mmol/L (ref 101–111)
GFR calc Af Amer: 26 mL/min — ABNORMAL LOW (ref >60)
GFR, EST NON AFRICAN AMERICAN: 23 mL/min — AB (ref >60)
Glucose, Bld: 97 mg/dL (ref 65–99)
POTASSIUM: 4.9 mmol/L (ref 3.5–5.1)
Sodium: 150 mmol/L — ABNORMAL HIGH (ref 135–145)
TOTAL PROTEIN: 5.8 g/dL — AB (ref 6.5–8.1)

## 2018-01-27 LAB — PROTIME-INR
INR: 1.39
PROTHROMBIN TIME: 17 s — AB (ref 11.4–15.2)

## 2018-01-27 LAB — CBC
HCT: 44 % (ref 39.0–52.0)
Hemoglobin: 13.8 g/dL (ref 13.0–17.0)
MCH: 32.8 pg (ref 26.0–34.0)
MCHC: 31.4 g/dL (ref 30.0–36.0)
MCV: 104.5 fL — AB (ref 78.0–100.0)
Platelets: 56 10*3/uL — ABNORMAL LOW (ref 150–400)
RBC: 4.21 MIL/uL — AB (ref 4.22–5.81)
RDW: 18.6 % — AB (ref 11.5–15.5)
WBC: 4.5 10*3/uL (ref 4.0–10.5)

## 2018-01-27 LAB — URINALYSIS, ROUTINE W REFLEX MICROSCOPIC
GLUCOSE, UA: NEGATIVE mg/dL
Ketones, ur: NEGATIVE mg/dL
Leukocytes, UA: NEGATIVE
NITRITE: NEGATIVE
PROTEIN: 30 mg/dL — AB
Specific Gravity, Urine: 1.015 (ref 1.005–1.030)
pH: 5 (ref 5.0–8.0)

## 2018-01-27 LAB — CBG MONITORING, ED: Glucose-Capillary: 83 mg/dL (ref 65–99)

## 2018-01-27 LAB — I-STAT CG4 LACTIC ACID, ED: Lactic Acid, Venous: 3.25 mmol/L (ref 0.5–1.9)

## 2018-01-27 LAB — I-STAT TROPONIN, ED: TROPONIN I, POC: 0.04 ng/mL (ref 0.00–0.08)

## 2018-01-27 LAB — POC OCCULT BLOOD, ED: Fecal Occult Bld: NEGATIVE

## 2018-01-27 LAB — APTT: aPTT: 60 seconds — ABNORMAL HIGH (ref 24–36)

## 2018-01-27 MED ORDER — ONDANSETRON 4 MG PO TBDP: 4.0000 mg | ORAL_TABLET | Freq: Four times a day (QID) | ORAL | Status: DC | PRN

## 2018-01-27 MED ORDER — PIPERACILLIN-TAZOBACTAM 3.375 G IVPB 30 MIN
3.3750 g | Freq: Once | INTRAVENOUS | Status: AC
  Administered 2018-01-27: 3.375 g via INTRAVENOUS
  Filled 2018-01-27: qty 50

## 2018-01-27 MED ORDER — SODIUM CHLORIDE 0.9 % IV BOLUS
2000.0000 mL | Freq: Once | INTRAVENOUS | Status: AC
  Administered 2018-01-27: 1000 mL via INTRAVENOUS

## 2018-01-27 MED ORDER — LORAZEPAM 2 MG/ML PO CONC: 0.5000 mg | ORAL | Status: DC | PRN

## 2018-01-27 MED ORDER — IPRATROPIUM-ALBUTEROL 0.5-2.5 (3) MG/3ML IN SOLN: 3.0000 mL | Freq: Four times a day (QID) | RESPIRATORY_TRACT | Status: DC | PRN

## 2018-01-27 MED ORDER — MORPHINE SULFATE (PF) 4 MG/ML IV SOLN
1.0000 mg | INTRAVENOUS | Status: DC | PRN
  Administered 2018-01-27 – 2018-01-28 (×3): 1 mg via INTRAVENOUS
  Filled 2018-01-27 (×3): qty 1

## 2018-01-27 MED ORDER — SODIUM CHLORIDE 0.9 % IV SOLN
Freq: Once | INTRAVENOUS | Status: AC
  Administered 2018-01-27: 10:00:00 via INTRAVENOUS

## 2018-01-27 MED ORDER — ACETAMINOPHEN 325 MG PO TABS: 650.0000 mg | ORAL_TABLET | Freq: Four times a day (QID) | ORAL | Status: DC | PRN

## 2018-01-27 MED ORDER — LOPERAMIDE HCL 2 MG PO CAPS: 2.0000 mg | ORAL_CAPSULE | ORAL | Status: DC | PRN

## 2018-01-27 MED ORDER — VANCOMYCIN HCL 10 G IV SOLR
1500.0000 mg | Freq: Once | INTRAVENOUS | Status: AC
  Administered 2018-01-27: 1500 mg via INTRAVENOUS
  Filled 2018-01-27: qty 1500

## 2018-01-27 MED ORDER — NYSTATIN 100000 UNIT/GM EX POWD
Freq: Three times a day (TID) | CUTANEOUS | Status: DC | PRN
  Filled 2018-01-27: qty 15

## 2018-01-27 MED ORDER — LORAZEPAM 2 MG/ML IJ SOLN
0.5000 mg | INTRAMUSCULAR | Status: DC | PRN
  Administered 2018-01-27: 0.5 mg via INTRAVENOUS
  Filled 2018-01-27: qty 1

## 2018-01-27 MED ORDER — LORAZEPAM 0.5 MG PO TABS: 0.5000 mg | ORAL_TABLET | ORAL | Status: DC | PRN

## 2018-01-27 MED ORDER — HALOPERIDOL 0.5 MG PO TABS
0.5000 mg | ORAL_TABLET | ORAL | Status: DC | PRN
  Filled 2018-01-27: qty 1

## 2018-01-27 MED ORDER — HALOPERIDOL LACTATE 2 MG/ML PO CONC
0.5000 mg | ORAL | Status: DC | PRN
  Filled 2018-01-27: qty 0.3

## 2018-01-27 MED ORDER — METOLAZONE 2.5 MG PO TABS
2.5000 mg | ORAL_TABLET | Freq: Every day | ORAL | Status: DC | PRN
  Filled 2018-01-27: qty 1

## 2018-01-27 MED ORDER — BISACODYL 10 MG RE SUPP: 10.0000 mg | Freq: Every day | RECTAL | Status: DC | PRN

## 2018-01-27 MED ORDER — ACETAMINOPHEN 650 MG RE SUPP: 650.0000 mg | Freq: Four times a day (QID) | RECTAL | Status: DC | PRN

## 2018-01-27 MED ORDER — ONDANSETRON HCL 4 MG/2ML IJ SOLN: 4.0000 mg | Freq: Four times a day (QID) | INTRAMUSCULAR | Status: DC | PRN

## 2018-01-27 MED ORDER — DIPHENHYDRAMINE HCL 50 MG/ML IJ SOLN: 12.5000 mg | INTRAMUSCULAR | Status: DC | PRN

## 2018-01-27 MED ORDER — VANCOMYCIN HCL IN DEXTROSE 1-5 GM/200ML-% IV SOLN: 1000.0000 mg | INTRAVENOUS | Status: DC

## 2018-01-27 MED ORDER — VANCOMYCIN HCL IN DEXTROSE 1-5 GM/200ML-% IV SOLN: 1000.0000 mg | Freq: Once | INTRAVENOUS | Status: DC

## 2018-01-27 MED ORDER — POLYVINYL ALCOHOL 1.4 % OP SOLN
1.0000 [drp] | Freq: Four times a day (QID) | OPHTHALMIC | Status: DC | PRN
  Filled 2018-01-27: qty 15

## 2018-01-27 MED ORDER — ALBUTEROL SULFATE (2.5 MG/3ML) 0.083% IN NEBU: 2.5000 mg | INHALATION_SOLUTION | RESPIRATORY_TRACT | Status: DC | PRN

## 2018-01-27 MED ORDER — PIPERACILLIN-TAZOBACTAM 3.375 G IVPB
3.3750 g | Freq: Three times a day (TID) | INTRAVENOUS | Status: DC
  Administered 2018-01-27 – 2018-01-28 (×2): 3.375 g via INTRAVENOUS
  Filled 2018-01-27 (×4): qty 50

## 2018-01-27 MED ORDER — ATROPINE SULFATE 1 % OP SOLN
4.0000 [drp] | OPHTHALMIC | Status: DC | PRN
  Administered 2018-01-27: 4 [drp] via SUBLINGUAL
  Filled 2018-01-27: qty 5

## 2018-01-27 MED ORDER — SODIUM CHLORIDE 0.9 % IV SOLN
12.5000 mg | Freq: Three times a day (TID) | INTRAVENOUS | Status: DC | PRN
  Filled 2018-01-27: qty 0.5

## 2018-01-27 MED ORDER — HALOPERIDOL LACTATE 5 MG/ML IJ SOLN: 0.5000 mg | INTRAMUSCULAR | Status: DC | PRN

## 2018-01-27 NOTE — ED Notes (Signed)
O2 on a 5L/m/[place]

## 2018-01-27 NOTE — Progress Notes (Signed)
NEURO BRIEF NOTE    Pre-arrival CODE STROKE:  Upon emergent examination the moment of his arrival with EMS. It became obvious the pt was in acute distress from a cardiac and respiratory stand point. He was awake, attempting to respond to verbal stimuli, but only gurgling noises as he was gasping for air. His resp were labored, rhonchi throughout. Pupils pinpoint. No focal deficit, but profoundly weak throughout. He has external pacer placed by EMS. Tele strip showed 3rd degree block, HR 30.   Code Stroke Canceled: Last seen normal was 1900 the night before, therefore out of time window for IV Alteplase. There were no true focal deficits and did not appear to present as LVO. Pt is very debilitated at baseline and is a DNR/DNI. ER team preceded to ACLS guidelines while family could be reached.

## 2018-01-27 NOTE — H&P (Signed)
History and Physical    Patrick [name] NKN:[id number] DOB: 1921/08/12 DOA: 02/09/2018   PCP: Crist Infante, MD   Patient coming from:  [place]  Chief Complaint: Shortness of breath   HPI: Patrick [name] is a 84 y.o. male with medical history significant for congestive heart failure recent admission from 4/29 through 12/30/2017, thrombocytopenia, chronic respiratory failure on home oxygen, hypertension, hyperlipidemia, atrial fibrillation brought from friends home after found to be gargling, gasping for air.  He was alert, but he was attempting to respond to verbal stimuli, but unable to do so.  His responses were labored.  Initially, code stroke was called, but upon presentation, there were no true focal deficits, although he appeared to be very debilitated and still very short of breath.  Code sepsis was called, after patient was noted to have hypotension, pulse in the 30s, and a temperature of 87.9, with respirations in the 20s.  On code sepsis, the patient was initially given IV fluids, due to his low blood pressure.  He also was placed on vancomycin and Zosyn for coverage.  The patient is according to the family more awake and alert, but still is unable to complete sentences.  History is provided by his sons.  They reported that his last seen normal was around 1900 hrs. the night before.  Patient denies any chest pain or palpitations.  He denies any abdominal pain nausea or vomiting.  He does not appear to have worsening lower extremity edema.  He still very ill-appearing.  He is DNR.  Discussion with his family in the EDP, who agreed to have a frank discussion with palliative care, regarding goals of care.  At this time, they still would like to have infection work-up, and continue with IV antibiotics, as well as supportive therapy. Chest x-ray cardiomegaly, with perihilar and bibasilar opacities suggestive of pulmonary edema and or layering pleural effusions, right lung base suspected  atelectasis versus early pneumonia  ED Course:  BP 106/67   Pulse (!) 35   Temp (S) (!) 87.9 F (31.1 C) (Rectal)   Resp (!) 23   Ht 5\' 8"  (1.727 m)   Wt 69.9 kg (154 lb)   SpO2 95%   BMI 23.42 kg/m   Received 2 L of IV fluids, Vanco and Zosyn. White count 4.5, hemoglobin 13.8, platelets 56,000 Differential negative PT 17, INR 1.39, PTT 60 Urinalysis with amber color, large hemoglobin, negative leukocytes. Alcohol negative Urine and blood culture pending 2D echo May 2019 shows EF 55 to 37%, normal systolic, moderate right-sided failure   Review of Systems:  As per HPI otherwise all other systems reviewed and are negative  Past Medical History:  Diagnosis Date  . AAA (abdominal aortic aneurysm) ([place])   . Adenomatous colon polyp 2004   TVA 04/2006  . Arthritis   . Atrial fibrillation ([place])   . Chronic respiratory failure ([place])   . CKD (chronic kidney disease), stage II   . Diverticulosis   . Dyspnea   . Hemorrhoids   . Hypercholesterolemia   . Hypertension   . Lower extremity edema   . Pleural effusion     Past Surgical History:  Procedure Laterality Date  . ESOPHAGOGASTRODUODENOSCOPY N/A 09/25/2016   Procedure: ESOPHAGOGASTRODUODENOSCOPY (EGD);  Surgeon: Irene Shipper, MD;  Location: [facility];  Service: Endoscopy;  Laterality: N/A;  . HERNIA REPAIR  Age 89  . TONSILLECTOMY     Very Young    Social History Social History  Socioeconomic History  . Marital status: Divorced    Spouse name: Not on file  . Number of children: 3  . Years of education: Not on file  . Highest education level: Not on file  Occupational History  . Occupation: retired    Fish farm manager: RETIRED  Social Needs  . Financial resource strain: Not on file  . Food insecurity:    Worry: Not on file    Inability: Not on file  . Transportation needs:    Medical: Not on file    Non-medical: Not on file  Tobacco Use  . Smoking status: Former Smoker    Types: Cigarettes    Last attempt to  quit: 08/29/1969    Years since quitting: 48.4  . Smokeless tobacco: Never Used  Substance and Sexual Activity  . Alcohol use: Yes    Comment: glass of wine 1-2 times per week  . Drug use: No  . Sexual activity: Not on file  Lifestyle  . Physical activity:    Days per week: Not on file    Minutes per session: Not on file  . Stress: Not on file  Relationships  . Social connections:    Talks on phone: Not on file    Gets together: Not on file    Attends religious service: Not on file    Active member of club or organization: Not on file    Attends meetings of clubs or organizations: Not on file    Relationship status: Not on file  . Intimate partner violence:    Fear of current or ex partner: Not on file    Emotionally abused: Not on file    Physically abused: Not on file    Forced sexual activity: Not on file  Other Topics Concern  . Not on file  Social History Narrative  . Not on file     No Known Allergies  Family History  Problem Relation Age of Onset  . Emphysema Father   . Diabetes Neg Hx   . Cancer Neg Hx       Prior to Admission medications   Medication Sig Start Date End Date Taking? Authorizing Provider  docusate sodium (COLACE) 100 MG capsule Take 100 mg by mouth daily.   Yes [provider]  ferrous sulfate 325 (65 FE) MG tablet Take 1 tablet (325 mg total) by mouth 3 (three) times daily with meals. 09/26/16  Yes Debbe Odea, MD  furosemide (LASIX) 20 MG tablet Take 20 mg by mouth daily.   Yes [provider]  metoprolol tartrate (LOPRESSOR) 25 MG tablet Take 12.5 mg by mouth 2 (two) times daily.  03/04/16  Yes [provider]  Amino Acids-Protein Hydrolys (FEEDING SUPPLEMENT, PRO-STAT SUGAR FREE 64,) LIQD Take 30 mLs by mouth 2 (two) times daily.    [provider]  furosemide (LASIX) 40 MG tablet Take 40 mg by mouth. Furosemide 40 mg tablet one by mouth for increased edema or weight gain  3 lbs from 152 lbs as needed.     [provider]  metolazone (ZAROXOLYN) 2.5 MG tablet Take 2.5 mg by mouth daily as needed (for weight gain of 3 pounds overnight or 5 pounds in a week).    [provider]    Physical Exam:  Vitals:   02/13/2018 1015 02/05/2018 1023 02/08/2018 1030 01/30/2018 1045  BP: (!) 87/50 (S) 92/60 (!) 81/50 106/67  Pulse:  (!) 35    Resp: 11  (!) 22 (!) 23  Temp:  (S) (!) 87.9 F (31.1 C)    TempSrc:  (S) Rectal    SpO2:  90%  95%  Weight:  69.9 kg (154 lb)    Height:  5\' 8"  (1.727 m)     Constitutional: Significant amount of discomfort, gargling, audible Rales without stethoscope.  He is anxious appearing, frail Eyes: PERRL, lids and conjunctivae normal ENMT: Mucous membranes are moist, without exudate or lesions  Neck: 3 cm JVD noted supple, no masses, no thyromegaly Respiratory: Tachypneic bibasilar decreased breath sounds, left greater than right, with rails audible throughout the lung, no wheezing or rhonchi.  There is moderate respiratory effort at times severe.  At this time, he is on 5 L of nasal cannula. Cardiovascular: bradycardic, no murmur, no rubs or gallops.  Lungs may be masked by crackles.  Bilateral 1+ lower extremity edema. 2+ pedal pulses. No carotid bruits.  Abdomen: Soft, non tender, No hepatosplenomegaly. Bowel sounds positive.  Musculoskeletal: no clubbing / cyanosis. Moves all extremities Skin: no jaundice, bilateral chronic discoloration of his lower extremities, no open areas.   Neurologic: Sensation intact  Strength equal in all extremities Psychiatric:   Alert and oriented, patient is somewhat anxious    Labs on Admission: I have personally reviewed following labs and imaging studies  CBC: Recent Labs  Lab 02/23/2018 0944 02/22/2018 1045  WBC  --  4.5  NEUTROABS  --  3.1  HGB 13.9 13.8  HCT 41.0 44.0  MCV  --  104.5*  PLT  --  56*    Basic Metabolic Panel: Recent Labs  Lab 01/30/2018 0944 02/14/2018 1045  NA 146* 150*  K 4.3 4.9  CL 104 103    CO2  --  34*  GLUCOSE 89 97  BUN 93* 109*  CREATININE 1.90* 2.29*  CALCIUM  --  8.4*    GFR: Estimated Creatinine Clearance: 19.1 mL/min (A) (by C-G formula based on SCr of 2.29 mg/dL (H)).  Liver Function Tests: Recent Labs  Lab 02/03/2018 1045  AST 47*  ALT 16*  ALKPHOS 194*  BILITOT 3.1*  PROT 5.8*  ALBUMIN 2.8*   No results for input(s): LIPASE, AMYLASE in the last 168 hours. No results for input(s): AMMONIA in the last 168 hours.  Coagulation Profile: Recent Labs  Lab 02/03/2018 1045  INR 1.39    Cardiac Enzymes: No results for input(s): CKTOTAL, CKMB, CKMBINDEX, TROPONINI in the last 168 hours.  BNP (last 3 results) No results for input(s): PROBNP in the last 8760 hours.  HbA1C: No results for input(s): HGBA1C in the last 72 hours.  CBG: Recent Labs  Lab 02/21/2018 0936  GLUCAP 83    Lipid Profile: No results for input(s): CHOL, HDL, LDLCALC, TRIG, CHOLHDL, LDLDIRECT in the last 72 hours.  Thyroid Function Tests: No results for input(s): TSH, T4TOTAL, FREET4, T3FREE, THYROIDAB in the last 72 hours.  Anemia Panel: No results for input(s): VITAMINB12, FOLATE, FERRITIN, TIBC, IRON, RETICCTPCT in the last 72 hours.  Urine analysis:    Component Value Date/Time   COLORURINE AMBER (A) 01/31/2018 1028   APPEARANCEUR CLOUDY (A) 02/10/2018 1028   LABSPEC 1.015 01/31/2018 1028   PHURINE 5.0 02/19/2018 1028   GLUCOSEU NEGATIVE 02/04/2018 1028   HGBUR LARGE (A) 02/10/2018 1028   BILIRUBINUR SMALL (A) 02/05/2018 1028   KETONESUR NEGATIVE 02/10/2018 1028   PROTEINUR 30 (A) 02/06/2018 1028   NITRITE NEGATIVE 02/18/2018 1028   LEUKOCYTESUR NEGATIVE 02/11/2018 1028    Sepsis Labs: @LABRCNTIP (procalcitonin:4,lacticidven:4) )No results found for  this or any previous visit (from the past 240 hour(s)).   Radiological Exams on Admission: Dg Chest Port 1 View  Result Date: 02/17/2018 CLINICAL DATA:  Hypothermia. EXAM: PORTABLE CHEST 1 VIEW COMPARISON:  Chest  x-ray dated 12/26/2017. FINDINGS: Stable cardiomegaly. Bilateral perihilar and bibasilar opacities suggesting a combination of pulmonary edema and layering pleural effusions. Asymmetrically prominent opacity at the RIGHT lung base. Pacer pad overlies the LEFT chest wall. No acute or suspicious osseous finding seen. IMPRESSION: 1. Cardiomegaly with perihilar and bibasilar opacities suggesting pulmonary edema and/or layering pleural effusions. Suspect some degree of CHF. 2. Asymmetrically prominent opacity at the RIGHT lung base, suspected atelectasis. If febrile, underlying pneumonia could not be excluded. Electronically Signed   By: Franki Cabot M.D.   On: 02/09/2018 10:16    EKG: Independently reviewed. Junctional rhythm, bradycardia BBB Assessment/Plan Principal Problem:   Sepsis ([place]) Active Problems:   Atrial fibrillation ([facility])   Personal history of colonic polyps   Aneurysm of abdominal vessel (H[facility])   HTN (hypertension)   Thrombocytopenia ([facility])   Chronic respiratory failure ([facility])   CKD (chronic kidney disease), stage II   Hyperlipidemia   Sepsis secondary to pneumonia, in a patient with hypothermia.  Meets the following sirs/sepsis criteria: Hypothermia, hypotension, bradycardia, evidence of multiorgan dysfunction.  The patient received 2 L of IV fluids, along with Zosyn and vancomycin at the ER.  Lactic acid 3.25.  Blood and urine cultures pending.  Unfortunately, due to overall medical decline, and in view of fluid overload, no further IV fluids are being given at this time.  Had discussion with the family regarding goals of care, who agreed to have palliative care evaluation, to further detail patient's prognosis, and route of care.  At this time, after discussion with sons, they still would like to have IV antibiotics, other supportive therapy, and would like to proceed with some labs.  . Need to inpatient palliative care unit Continue bear hugger for thermia Follow to Check blood and  urine cultures For now, continue vancomycin and Zosyn. CBC in a.m. Palliative care order set for comfort   Acute on chronic respiratory failure with hypoxia, in the setting of acute on chronic CHF, and worsening kidney function in the setting of disease progression and pleural effusion and possible early pneumonia .  Chest x-ray shows vascular congestion and pleural effusion.2D echo May 2019 shows EF 55 to 16%, normal systolic, moderate right-sided failure.  He did receive 2 L of fluid due to sepsis at the ED, but due to fluid overload, no further fluids will be given. Inpatient palliative care Daily weights, with I/ o Hold beta-blocker due to bradycardia Antibiotics as above  Palliative care order set for comfort      Atrial fibrillation-bradycardia.  Heart rate is in the 30s.  He is not on anticoagulation due to thrombocytopenia. We will continue to hold metoprolol due to above She is DNR, not pacer candidate report   Hypertension with an episode of hypotension at the ED, in the setting of sepsis. BP 106/67   Pulse 35 Hold home anti-hypertensive medications  Will not provide IV fluids, in view of his fluid overload per chest x-ray due to CHF exacerbation.     Acute on Chronic CKD stage 2: likely due to dehydration  Current Cr is 2.29, baseline is about 1.49 Lab Results  Component Value Date   CREATININE 2.29 (H) 02/13/2018   CREATININE 1.90 (H) 02/10/2018   CREATININE 1.49 01/11/2018  Hold diuretics due to  above, and in view of AKI on chronic NO IVF at this time Follow BMP daily   Thrombocytopenia, chronic, platelets are 56,000, at baseline.  No acute bleeding issues. No transfusion is indicated at this time Monitor counts closely    DVT prophylaxis: SCDs Code Status:    DNR Family Communication:  Discussed with patient Disposition Plan: Expect patient to be discharged to home after condition improves Consults called:   Palliative care evaluation Admission status:  Inpatient palliative care   Sharene Butters, PA-C [facility]   Amion text  [phone]   02/24/2018, 12:24 PM

## 2018-01-27 NOTE — ED Notes (Signed)
Family at bedside --Patrick [name]- friend and son Patrick [name]

## 2018-01-27 NOTE — Progress Notes (Signed)
Pharmacy Antibiotic Note  Patrick [name] is a 84 y.o. male admitted on 02/03/2018 with sepsis.  Pharmacy has been consulted for vancomycin and zosyn dosing.  SCr 1.9, CrCl ~20ml/min  Plan: Start Zosyn 3.375 gm IV q8h (4 hour infusion) Give vancomycin 1.5g IV x 1, then start vancomycin 1g IV Q48h Monitor clinical picture, renal function, VT prn F/U C&S, abx deescalation / LOT  No data recorded.  Recent Labs  Lab 02/06/2018 0944 01/28/2018 1003  CREATININE 1.90*  --   LATICACIDVEN  --  3.25*    Estimated Creatinine Clearance: 23 mL/min (A) (by C-G formula based on SCr of 1.9 mg/dL (H)).    No Known Allergies   Thank you for allowing pharmacy to be a part of this patient's care.  Reginia Naas 02/20/2018 10:06 AM

## 2018-01-27 NOTE — ED Triage Notes (Signed)
To ED via [facility] from [facility] [place]- on arrival - pt sitting upright, gurgling resp, pt does respond to

## 2018-01-27 NOTE — ED Notes (Signed)
Pt is more alert.

## 2018-01-27 NOTE — Progress Notes (Signed)
   02/01/2018 1200  Clinical Encounter Type  Visited With Patient and family together  Visit Type Initial;Spiritual support  Referral From Chaplain  Consult/Referral To Chaplain  Spiritual Encounters  Spiritual Needs Prayer;Emotional  Stress Factors  Patient Stress Factors Major life changes  Family Stress Factors Health changes  Chaplain visited with the family as hey were gathered in the trauma bay.  Chaplain prayed with the family, wife and 2 of 3 sons at bedside.  The family pastor was also present with the family.  Dr had update meeting with the family.

## 2018-01-27 NOTE — ED Provider Notes (Addendum)
[facility] EMERGENCY DEPARTMENT Provider Note   CSN: [id number] Arrival date & time: 02/02/2018  0932     History   Chief Complaint Chief Complaint  Patient presents with  . Code Stroke    HPI Patrick [name] is a 84 y.o. male.level V caveat acuity of situation. History is obtained from paramedics and paperwork coming patient. [facility] with incomprehensible speech.. He was last normal at 7 PM yesterday. Code stroke was called in the field.he was noted to be bradycardic in the field with a pulse in the 30s. EMS treated patient with transcutaneous pacemaker  HPI  Past Medical History:  Diagnosis Date  . AAA (abdominal aortic aneurysm) ([place])   . Adenomatous colon polyp 2004   TVA 04/2006  . Arthritis   . Atrial fibrillation ([place])   . Chronic respiratory failure ([place])   . CKD (chronic kidney disease), stage II   . Diverticulosis   . Dyspnea   . Hemorrhoids   . Hypercholesterolemia   . Hypertension   . Lower extremity edema   . Pleural effusion     Patient Active Problem List   Diagnosis Date Noted  . Pressure injury of skin 12/27/2017  . Acute on chronic heart failure ([place]) 12/26/2017  . Acute heart failure ([place]) 12/26/2017  . Bradycardia 12/26/2017  . Hypertension   . Pleural effusion   . Lower extremity edema   . Dyspnea   . Chronic respiratory failure ([place])   . CKD (chronic kidney disease), stage II   . Unilateral primary osteoarthritis, left knee 03/16/2017  . Unilateral primary osteoarthritis, right knee 03/16/2017  . Thrombocytopenia ([place]) 02/20/2017  . Chronic pain of both knees 02/15/2017  . Effusion of both knee joints 02/15/2017  . Angiodysplasia of duodenum   . Melena 09/23/2016  . Blood loss anemia 09/23/2016  . Hyperkalemia 09/23/2016  . HTN (hypertension) 08/08/2014  . AAA (abdominal aortic aneurysm) ([place]) 05/03/2013  . 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  Abdominal aneurysm without mention of rupture 08/06/2012  . Atrial fibrillation ([place]) 06/15/2009  . PERSONAL HISTORY OF COLONIC POLYPS 06/15/2009    Past Surgical History:  Procedure Laterality Date  . ESOPHAGOGASTRODUODENOSCOPY N/A 09/25/2016   Procedure: ESOPHAGOGASTRODUODENOSCOPY (EGD);  Surgeon: Irene Shipper, MD;  Location: [facility];  Service: Endoscopy;  Laterality: N/A;  . HERNIA REPAIR  Age 81  . TONSILLECTOMY     Very Young        Home Medications    Prior to Admission medications   Medication Sig Start Date End Date Taking? Authorizing Provider  docusate sodium (COLACE) 100 MG capsule Take 100 mg by mouth daily.   Yes [provider]  ferrous sulfate 325 (65 FE) MG tablet Take 1 tablet (325 mg total) by mouth 3 (three) times daily with meals. 09/26/16  Yes Debbe Odea, MD  furosemide (LASIX) 20 MG tablet Take 20 mg by mouth daily.   Yes [provider]  metoprolol tartrate (LOPRESSOR) 25 MG tablet Take 12.5 mg by mouth 2 (two) times daily.  03/04/16  Yes [provider]  Abaloparatide (TYMLOS) 3120 MCG/1.56ML SOPN Inject 80 mcg into the skin daily.    [provider]  Amino Acids-Protein Hydrolys (FEEDING SUPPLEMENT, PRO-STAT SUGAR FREE 64,) LIQD Take 30 mLs by mouth 2 (two) times daily.    [provider]  furosemide (LASIX) 40 MG tablet Take 40 mg by mouth. Furosemide 40 mg tablet one  by mouth for increased edema or weight gain  3 lbs from 152 lbs as needed.    [provider]  metolazone (ZAROXOLYN) 2.5 MG tablet Take 2.5 mg by mouth daily as needed (for weight gain of 3 pounds overnight or 5 pounds in a week).    [provider]    Family History Family History  Problem Relation Age of Onset  . Emphysema Father   . Diabetes Neg Hx   . Cancer Neg Hx     Social History Social History   Tobacco Use  . Smoking status: Former Smoker    Types: Cigarettes    Last attempt to quit: 08/29/1969    Years  since quitting: 48.4  . Smokeless tobacco: Never Used  Substance Use Topics  . Alcohol use: Yes    Comment: glass of wine 1-2 times per week  . Drug use: No     Allergies   Patient has no known allergies.   Review of Systems Review of Systems  Unable to perform ROS: Acuity of condition     Physical Exam Updated Vital Signs There were no vitals taken for this visit.  Physical Exam  Constitutional:  Ill-appearing  Eyes: Pupils are equal, round, and reactive to light. Conjunctivae are normal.  Neck: Neck supple. No tracheal deviation present. No thyromegaly present.  Cardiovascular: Regular rhythm.  No murmur heard. bradycardic  Pulmonary/Chest: Effort normal.  Diffuse rhonchi  Abdominal: Soft. Bowel sounds are normal. He exhibits no distension. There is no tenderness.  Musculoskeletal: Normal range of motion. He exhibits no edema or tenderness.  Neurological: Coordination normal.  Opens eyes to verbal stimulus. Speech slurred. Squeezes hand to verbal command. Does not follow all simple commands. His all extremities  Skin: No rash noted.  , coolmultiple skin tears.  Psychiatric:  Unobtainable.  Nursing note and vitals reviewed.    ED Treatments / Results  Labs (all labs ordered are listed, but only abnormal results are displayed) Labs Reviewed  I-STAT CHEM 8, ED - Abnormal; Notable for the following components:      Result Value   Sodium 146 (*)    BUN 93 (*)    Creatinine, Ser 1.90 (*)    Calcium, Ion 0.84 (*)    TCO2 34 (*)    All other components within normal limits  CULTURE, BLOOD (ROUTINE X 2)  CULTURE, BLOOD (ROUTINE X 2)  PROTIME-INR  APTT  CBC  DIFFERENTIAL  COMPREHENSIVE METABOLIC PANEL  URINALYSIS, ROUTINE W REFLEX MICROSCOPIC  I-STAT TROPONIN, ED  CBG MONITORING, ED  I-STAT CG4 LACTIC ACID, ED    EKG Interpretation  Date/Time:  Saturday January 27 2018 09:43:15 EDT Ventricular Rate:  50 PR Interval:    QRS Duration: 154 QT  Interval:  636 QTC Calculation: 581 R Axis:   -44 Text Interpretation:  Junctional rhythm Left bundle branch block Since last tracing rate slower Confirmed by [place], Inocente Salles [id number]) on 01/28/2018 9:53:55 AM      EKG EKG Interpretation  Date/Time:  Saturday January 27 2018 09:43:15 EDT Ventricular Rate:  50 PR Interval:    QRS Duration: 154 QT Interval:  636 QTC Calculation: 581 R Axis:   -44 Text Interpretation:  Junctional rhythm Left bundle branch block Since last tracing rate slower Confirmed by Orlie Dakin [phone]) on 02/01/2018 9:53:55 AM  Results for orders placed or performed during the hospital encounter of 02/08/2018  Protime-INR  Result Value Ref Range   Prothrombin Time 17.0 (H) 11.4 - 15.2 seconds  INR 1.39   APTT  Result Value Ref Range   aPTT 60 (H) 24 - 36 seconds  CBC  Result Value Ref Range   WBC 4.5 4.0 - 10.5 K/uL   RBC 4.21 (L) 4.22 - 5.81 MIL/uL   Hemoglobin 13.8 13.0 - 17.0 g/dL   HCT 44.0 39.0 - 52.0 %   MCV 104.5 (H) 78.0 - 100.0 fL   MCH 32.8 26.0 - 34.0 pg   MCHC 31.4 30.0 - 36.0 g/dL   RDW 18.6 (H) 11.5 - 15.5 %   Platelets 56 (L) 150 - 400 K/uL  Differential  Result Value Ref Range   Neutrophils Relative % 68 %   Neutro Abs 3.1 1.7 - 7.7 K/uL   Lymphocytes Relative 12 %   Lymphs Abs 0.5 (L) 0.7 - 4.0 K/uL   Monocytes Relative 15 %   Monocytes Absolute 0.7 0.1 - 1.0 K/uL   Eosinophils Relative 3 %   Eosinophils Absolute 0.1 0.0 - 0.7 K/uL   Basophils Relative 1 %   Basophils Absolute 0.1 0.0 - 0.1 K/uL   Immature Granulocytes 1 %   Abs Immature Granulocytes 0.0 0.0 - 0.1 K/uL  Urinalysis, Routine w reflex microscopic  Result Value Ref Range   Color, Urine AMBER (A) YELLOW   APPearance CLOUDY (A) CLEAR   Specific Gravity, Urine 1.015 1.005 - 1.030   pH 5.0 5.0 - 8.0   Glucose, UA NEGATIVE NEGATIVE mg/dL   Hgb urine dipstick LARGE (A) NEGATIVE   Bilirubin Urine SMALL (A) NEGATIVE   Ketones, ur NEGATIVE NEGATIVE mg/dL   Protein, ur 30  (A) NEGATIVE mg/dL   Nitrite NEGATIVE NEGATIVE   Leukocytes, UA NEGATIVE NEGATIVE   RBC / HPF 11-20 0 - 5 RBC/hpf   WBC, UA 0-5 0 - 5 WBC/hpf   Bacteria, UA RARE (A) NONE SEEN   Squamous Epithelial / LPF 0-5 0 - 5   Hyaline Casts, UA PRESENT    Amorphous Crystal PRESENT   I-stat troponin, ED  Result Value Ref Range   Troponin i, poc 0.04 0.00 - 0.08 ng/mL   Comment 3          CBG monitoring, ED  Result Value Ref Range   Glucose-Capillary 83 65 - 99 mg/dL  I-Stat Chem 8, ED  Result Value Ref Range   Sodium 146 (H) 135 - 145 mmol/L   Potassium 4.3 3.5 - 5.1 mmol/L   Chloride 104 101 - 111 mmol/L   BUN 93 (H) 6 - 20 mg/dL   Creatinine, Ser 1.90 (H) 0.61 - 1.24 mg/dL   Glucose, Bld 89 65 - 99 mg/dL   Calcium, Ion 0.84 (LL) 1.15 - 1.40 mmol/L   TCO2 34 (H) 22 - 32 mmol/L   Hemoglobin 13.9 13.0 - 17.0 g/dL   HCT 41.0 39.0 - 52.0 %   Comment NOTIFIED PHYSICIAN   I-Stat CG4 Lactic Acid, ED  (not at  [facility])  Result Value Ref Range   Lactic Acid, Venous 3.25 (HH) 0.5 - 1.9 mmol/L   Comment NOTIFIED PHYSICIAN   POC occult blood, ED RN will collect  Result Value Ref Range   Fecal Occult Bld NEGATIVE NEGATIVE   Dg Chest Port 1 View  Result Date: 02/10/2018 CLINICAL DATA:  Hypothermia. EXAM: PORTABLE CHEST 1 VIEW COMPARISON:  Chest x-ray dated 12/26/2017. FINDINGS: Stable cardiomegaly. Bilateral perihilar and bibasilar opacities suggesting a combination of pulmonary edema and layering pleural effusions. Asymmetrically prominent opacity at the RIGHT lung base. Pacer pad  overlies the LEFT chest wall. No acute or suspicious osseous finding seen. IMPRESSION: 1. Cardiomegaly with perihilar and bibasilar opacities suggesting pulmonary edema and/or layering pleural effusions. Suspect some degree of CHF. 2. Asymmetrically prominent opacity at the RIGHT lung base, suspected atelectasis. If febrile, underlying pneumonia could not be excluded. Electronically Signed   By: Franki Cabot M.D.   On:  02/16/2018 10:16   Radiology No results found.  Procedures Procedures (including critical care time)  Medications Ordered in ED Medications  piperacillin-tazobactam (ZOSYN) IVPB 3.375 g (has no administration in time range)  vancomycin (VANCOCIN) IVPB 1000 mg/200 mL premix (has no administration in time range)   Results for orders placed or performed during the hospital encounter of 02/09/2018  Protime-INR  Result Value Ref Range   Prothrombin Time 17.0 (H) 11.4 - 15.2 seconds   INR 1.39   APTT  Result Value Ref Range   aPTT 60 (H) 24 - 36 seconds  CBC  Result Value Ref Range   WBC 4.5 4.0 - 10.5 K/uL   RBC 4.21 (L) 4.22 - 5.81 MIL/uL   Hemoglobin 13.8 13.0 - 17.0 g/dL   HCT 44.0 39.0 - 52.0 %   MCV 104.5 (H) 78.0 - 100.0 fL   MCH 32.8 26.0 - 34.0 pg   MCHC 31.4 30.0 - 36.0 g/dL   RDW 18.6 (H) 11.5 - 15.5 %   Platelets 56 (L) 150 - 400 K/uL  Differential  Result Value Ref Range   Neutrophils Relative % 68 %   Neutro Abs 3.1 1.7 - 7.7 K/uL   Lymphocytes Relative 12 %   Lymphs Abs 0.5 (L) 0.7 - 4.0 K/uL   Monocytes Relative 15 %   Monocytes Absolute 0.7 0.1 - 1.0 K/uL   Eosinophils Relative 3 %   Eosinophils Absolute 0.1 0.0 - 0.7 K/uL   Basophils Relative 1 %   Basophils Absolute 0.1 0.0 - 0.1 K/uL   Immature Granulocytes 1 %   Abs Immature Granulocytes 0.0 0.0 - 0.1 K/uL  Comprehensive metabolic panel  Result Value Ref Range   Sodium 150 (H) 135 - 145 mmol/L   Potassium 4.9 3.5 - 5.1 mmol/L   Chloride 103 101 - 111 mmol/L   CO2 34 (H) 22 - 32 mmol/L   Glucose, Bld 97 65 - 99 mg/dL   BUN 109 (H) 6 - 20 mg/dL   Creatinine, Ser 2.29 (H) 0.61 - 1.24 mg/dL   Calcium 8.4 (L) 8.9 - 10.3 mg/dL   Total Protein 5.8 (L) 6.5 - 8.1 g/dL   Albumin 2.8 (L) 3.5 - 5.0 g/dL   AST 47 (H) 15 - 41 U/L   ALT 16 (L) 17 - 63 U/L   Alkaline Phosphatase 194 (H) 38 - 126 U/L   Total Bilirubin 3.1 (H) 0.3 - 1.2 mg/dL   GFR calc non Af Amer 23 (L) >60 mL/min   GFR calc Af Amer 26 (L)  >60 mL/min   Anion gap 13 5 - 15  Urinalysis, Routine w reflex microscopic  Result Value Ref Range   Color, Urine AMBER (A) YELLOW   APPearance CLOUDY (A) CLEAR   Specific Gravity, Urine 1.015 1.005 - 1.030   pH 5.0 5.0 - 8.0   Glucose, UA NEGATIVE NEGATIVE mg/dL   Hgb urine dipstick LARGE (A) NEGATIVE   Bilirubin Urine SMALL (A) NEGATIVE   Ketones, ur NEGATIVE NEGATIVE mg/dL   Protein, ur 30 (A) NEGATIVE mg/dL   Nitrite NEGATIVE NEGATIVE   Leukocytes, UA NEGATIVE  NEGATIVE   RBC / HPF 11-20 0 - 5 RBC/hpf   WBC, UA 0-5 0 - 5 WBC/hpf   Bacteria, UA RARE (A) NONE SEEN   Squamous Epithelial / LPF 0-5 0 - 5   Hyaline Casts, UA PRESENT    Amorphous Crystal PRESENT   I-stat troponin, ED  Result Value Ref Range   Troponin i, poc 0.04 0.00 - 0.08 ng/mL   Comment 3          CBG monitoring, ED  Result Value Ref Range   Glucose-Capillary 83 65 - 99 mg/dL  I-Stat Chem 8, ED  Result Value Ref Range   Sodium 146 (H) 135 - 145 mmol/L   Potassium 4.3 3.5 - 5.1 mmol/L   Chloride 104 101 - 111 mmol/L   BUN 93 (H) 6 - 20 mg/dL   Creatinine, Ser 1.90 (H) 0.61 - 1.24 mg/dL   Glucose, Bld 89 65 - 99 mg/dL   Calcium, Ion 0.84 (LL) 1.15 - 1.40 mmol/L   TCO2 34 (H) 22 - 32 mmol/L   Hemoglobin 13.9 13.0 - 17.0 g/dL   HCT 41.0 39.0 - 52.0 %   Comment NOTIFIED PHYSICIAN   I-Stat CG4 Lactic Acid, ED  (not at  [facility])  Result Value Ref Range   Lactic Acid, Venous 3.25 (HH) 0.5 - 1.9 mmol/L   Comment NOTIFIED PHYSICIAN   POC occult blood, ED RN will collect  Result Value Ref Range   Fecal Occult Bld NEGATIVE NEGATIVE   Dg Chest Port 1 View  Result Date: 02/01/2018 CLINICAL DATA:  Hypothermia. EXAM: PORTABLE CHEST 1 VIEW COMPARISON:  Chest x-ray dated 12/26/2017. FINDINGS: Stable cardiomegaly. Bilateral perihilar and bibasilar opacities suggesting a combination of pulmonary edema and layering pleural effusions. Asymmetrically prominent opacity at the RIGHT lung base. Pacer pad overlies the LEFT chest  wall. No acute or suspicious osseous finding seen. IMPRESSION: 1. Cardiomegaly with perihilar and bibasilar opacities suggesting pulmonary edema and/or layering pleural effusions. Suspect some degree of CHF. 2. Asymmetrically prominent opacity at the RIGHT lung base, suspected atelectasis. If febrile, underlying pneumonia could not be excluded. Electronically Signed   By: Franki Cabot M.D.   On: 01/28/2018 10:16    Initial Impression / Assessment and Plan / ED Course  I have reviewed the triage vital signs and the nursing notes.  Pertinent labs & imaging results that were available during my care of the patient were reviewed by me and considered in my medical decision making (see chart for details).     Code stroke called shortly after patient's arrival. I contacted patient's son Patrick [name] and also his healthcare power of attorney by telephone. Patient is DO NOT RESUSCITATE CODE STATUS. They're okay with IV antibiotics and IV fluids and supportive care  Code sepsis called based onhypothermia and mental status change. This infection unclear. IV fluids ordered. Broad spectrum antibiotics ordered . Chest x-ray viewed by me  Family is aware the patient's condition likely moribund. They are comfortable with supportive care, IV fluids and antibiotics.I suspect the patient is a candidate for hospice.palliative care consult called  Consult hospitalist service who will arrange for admission 11:40 AM patient appears more alert after treatment with IV fluids, bear hugger.pulse is now in the 50s. Transcutaneous pacemakers on the patient however we have not turned on as his blood pressure has improved..   Family is aware the patient's condition is critical and he may not survive this insult.  Lab work consistent with thrombocytopenia, acute kidney injury and  hypernatremia, and thrombocytopenia which is chronic. Sepsis - Repeat Assessment  Performed at:    1210pm  Vitals     Blood pressure 106/67, pulse  (!) 35, temperature (S) (!) 87.9 F (31.1 C), temperature source (S) Rectal, resp. rate (!) 23, height 5\' 8"  (1.727 m), weight 69.9 kg (154 lb), SpO2 95 %.  Heart:     bradycardic  Lungs:    Rhonchi  Capillary Refill:   <2 sec  Peripheral Pulse:   Radial pulse palpable  Skin:     Normal Color   Final Clinical Impressions(s) / ED Diagnoses  Diagnosis #1 sepsis #2 hypothermia #3 acute kidney injury #4 hypernatremia #5 thrombocytopenia Final diagnoses:  None    ED Discharge Orders    None       Orlie Dakin, MD 02/05/2018 1215 CRITICAL CARE Performed by: Orlie Dakin Total critical care time: 50 minutes Critical care time was exclusive of separately billable procedures and treating other patients. Critical care was necessary to treat or prevent imminent or life-threatening deterioration. Critical care was time spent personally by me on the following activities: development of treatment plan with patient and/or surrogate as well as nursing, discussions with consultants, evaluation of patient's response to treatment, examination of patient, obtaining history from patient or surrogate, ordering and performing treatments and interventions, ordering and review of laboratory studies, ordering and review of radiographic studies, pulse oximetry and re-evaluation of patient's condition.   Orlie Dakin, MD 02/05/18 [id number]

## 2018-01-27 NOTE — ED Notes (Signed)
Pt on arrival is awke, will respond to verbal stimuli, gurgling type resp, rhonchi throughout, pupils, pinpoint, being paced per EMS, pacer turned off after moving over to stretcher-- placed on ED pacing pads

## 2018-01-27 NOTE — ED Notes (Signed)
Code stroke cancelled 

## 2018-01-28 DIAGNOSIS — Z7189 Other specified counseling: Secondary | ICD-10-CM

## 2018-01-28 DIAGNOSIS — Z515 Encounter for palliative care: Secondary | ICD-10-CM

## 2018-01-28 DIAGNOSIS — A419 Sepsis, unspecified organism: Principal | ICD-10-CM

## 2018-01-28 DIAGNOSIS — I481 Persistent atrial fibrillation: Secondary | ICD-10-CM

## 2018-01-28 DIAGNOSIS — J810 Acute pulmonary edema: Secondary | ICD-10-CM

## 2018-01-28 LAB — URINE CULTURE: CULTURE: NO GROWTH

## 2018-01-28 MED ORDER — MORPHINE SULFATE (PF) 2 MG/ML IV SOLN
1.0000 mg | INTRAVENOUS | Status: DC | PRN
Start: 2018-01-28 — End: 2018-01-29

## 2018-01-28 MED ORDER — MORPHINE SULFATE (PF) 2 MG/ML IV SOLN
1.0000 mg | INTRAVENOUS | Status: DC
  Administered 2018-01-28 – 2018-01-29 (×8): 1 mg via INTRAVENOUS
  Filled 2018-01-28 (×9): qty 1

## 2018-01-28 MED ORDER — LORAZEPAM 2 MG/ML IJ SOLN: 0.5000 mg | INTRAMUSCULAR | Status: DC | PRN

## 2018-01-28 MED ORDER — MORPHINE SULFATE (PF) 2 MG/ML IV SOLN: 1.0000 mg | INTRAVENOUS | Status: DC | PRN

## 2018-01-28 MED ORDER — GLYCOPYRROLATE 0.2 MG/ML IJ SOLN: 0.2000 mg | INTRAMUSCULAR | Status: DC | PRN

## 2018-01-28 NOTE — Progress Notes (Signed)
Patient ID: Patrick [name], male   DOB: 02/26/1921, 84 y.o.   MRN: [id number]  PROGRESS NOTE    Patrick [name]  PPI:[id number] DOB: [birth year] DOA: 01/30/2018 PCP: Crist Infante, MD   82 Outpatient Specialists:    Brief Narrative:   84 year old gentleman with recent hospitalization for congestive heart failure presents with hypothermia, hypotension, bradycardia all consistent with sepsis.  Code sepsis was called in the emergency department the patient received 2 L of saline as well as IV antibiotics.  He is currently struggling on 100% nonrebreather.  He has audible rales.  Patient's family have decided to make him comfort measures only. This is as a result of severe sepsis. Antibiotics as still on board but other medical care is being withdrawn.   Assessment & Plan:   Principal Problem:   Sepsis ([place]) Active Problems:   Atrial fibrillation (HCC)   Personal history of colonic polyps   Aneurysm of abdominal vessel (HCC)   HTN (hypertension)   Thrombocytopenia (HCC)   Chronic respiratory failure (HCC)   CKD (chronic kidney disease), stage II   Hyperlipidemia   #1 sepsis: Patient on antibiotics but to comfort measures at this point. We will hold off on all labs. Palliative care consulted to meet with family. Goals of care would be discussed then.  #2 other chronic medical problems: At this point patient is comfort measures only. Atrial fibrillation, hypertension, chronic kidney disease as well as other medical problems are contributing factors. There've not been actively managed at this point   DVT prophylaxis: N/A Code Status: DNR Family Communication: Son Disposition Plan: comfort measures only   Consultants:   Palliative care   Procedures:  none   Antimicrobials: Zosyn    Subjective: Patient currently appears comfortable. He is still on Zosyn which will likely be discontinued today.  Objective: Vitals:   02/01/2018 1045 02/08/2018 1245 02/18/2018 1426 01/28/18 0539  BP:  106/67 (!) 83/49 (!) 60/50 (!) 77/66  Pulse:   (!) 40 (!) 51  Resp: (!) 23 12 (!) 40 (!) 40  Temp:      TempSrc:    Axillary  SpO2: 95%  97% (!) 80%  Weight:    83.1 kg (183 lb 3.2 oz)  Height:        Intake/Output Summary (Last 24 hours) at 01/28/2018 0733 Last data filed at 01/28/2018 0101 Gross per 24 hour  Intake 2600 ml  Output -  Net 2600 ml   Filed Weights   01/30/2018 1023 01/28/18 0539  Weight: 69.9 kg (154 lb) 83.1 kg (183 lb 3.2 oz)    Examination:  General exam: Appears calm and comfortable  Respiratory system: Clear to auscultation. Respiratory effort normal. Cardiovascular system: S1 & S2 heard, RRR. No JVD, murmurs, rubs, gallops or clicks. No pedal edema. Gastrointestinal system: Abdomen is nondistended, soft and nontender. No organomegaly or masses felt. Normal bowel sounds heard. Central nervous system: Somnolent. No focal neurological deficits. Extremities: Symmetric 5 x 5 power. Skin: No rashes, lesions or ulcers Psychiatry: not responding adequately   Data Reviewed: I have personally reviewed following labs and imaging studies  CBC: Recent Labs  Lab 02/21/2018 0944 02/02/2018 1045  WBC  --  4.5  NEUTROABS  --  3.1  HGB 13.9 13.8  HCT 41.0 44.0  MCV  --  104.5*  PLT  --  56*   Basic Metabolic Panel: Recent Labs  Lab 02/21/2018 0944 02/12/2018 1045  NA 146* 150*  K 4.3 4.9  CL 104 103  CO2  --  34*  GLUCOSE 89 97  BUN 93* 109*  CREATININE 1.90* 2.29*  CALCIUM  --  8.4*   GFR: Estimated Creatinine Clearance: 20.7 mL/min (A) (by C-G formula based on SCr of 2.29 mg/dL (H)). Liver Function Tests: Recent Labs  Lab 02/10/2018 1045  AST 47*  ALT 16*  ALKPHOS 194*  BILITOT 3.1*  PROT 5.8*  ALBUMIN 2.8*   No results for input(s): LIPASE, AMYLASE in the last 168 hours. No results for input(s): AMMONIA in the last 168 hours. Coagulation Profile: Recent Labs  Lab 02/19/2018 1045  INR 1.39   Cardiac Enzymes: No results for input(s): CKTOTAL,  CKMB, CKMBINDEX, TROPONINI in the last 168 hours. BNP (last 3 results) No results for input(s): PROBNP in the last 8760 hours. HbA1C: No results for input(s): HGBA1C in the last 72 hours. CBG: Recent Labs  Lab 02/18/2018 0936  GLUCAP 83   Lipid Profile: No results for input(s): CHOL, HDL, LDLCALC, TRIG, CHOLHDL, LDLDIRECT in the last 72 hours. Thyroid Function Tests: No results for input(s): TSH, T4TOTAL, FREET4, T3FREE, THYROIDAB in the last 72 hours. Anemia Panel: No results for input(s): VITAMINB12, FOLATE, FERRITIN, TIBC, IRON, RETICCTPCT in the last 72 hours. Urine analysis:    Component Value Date/Time   COLORURINE AMBER (A) 01/30/2018 1028   APPEARANCEUR CLOUDY (A) 02/10/2018 1028   LABSPEC 1.015 02/02/2018 1028   PHURINE 5.0 02/24/2018 1028   GLUCOSEU NEGATIVE 02/13/2018 1028   HGBUR LARGE (A) 01/31/2018 1028   BILIRUBINUR SMALL (A) 02/19/2018 1028   KETONESUR NEGATIVE 02/25/2018 1028   PROTEINUR 30 (A) 02/08/2018 1028   NITRITE NEGATIVE 02/16/2018 1028   LEUKOCYTESUR NEGATIVE 02/01/2018 1028   Sepsis Labs: @LABRCNTIP (procalcitonin:4,lacticidven:4)  )No results found for this or any previous visit (from the past 240 hour(s)).       Radiology Studies: Dg Chest Port 1 View  Result Date: 01/30/2018 CLINICAL DATA:  Hypothermia. EXAM: PORTABLE CHEST 1 VIEW COMPARISON:  Chest x-ray dated 12/26/2017. FINDINGS: Stable cardiomegaly. Bilateral perihilar and bibasilar opacities suggesting a combination of pulmonary edema and layering pleural effusions. Asymmetrically prominent opacity at the RIGHT lung base. Pacer pad overlies the LEFT chest wall. No acute or suspicious osseous finding seen. IMPRESSION: 1. Cardiomegaly with perihilar and bibasilar opacities suggesting pulmonary edema and/or layering pleural effusions. Suspect some degree of CHF. 2. Asymmetrically prominent opacity at the RIGHT lung base, suspected atelectasis. If febrile, underlying pneumonia could not be  excluded. Electronically Signed   By: Franki Cabot M.D.   On: 01/31/2018 10:16        Scheduled Meds: Continuous Infusions: . chlorproMAZINE (THORAZINE) IV    . piperacillin-tazobactam (ZOSYN)  IV Stopped (01/28/18 0501)  . [START ON 21-Feb-2018] vancomycin       LOS: 82 day    Time spent: 22 minutes    GARBA,LAWAL, MD [facility] Pager [phone]  If 7PM-7AM, please contact night-coverage [email or web address] Password [facility] 01/28/2018, 7:33 AM

## 2018-01-28 NOTE — Consult Note (Signed)
Consultation Note Date: 01/28/2018   Patient Name: Patrick [name]  DOB: August 17, 1921  MRN: [id number]  Age / Sex: 84 y.o., male  PCP: Crist Infante, MD Referring Physician: Elwyn Reach, MD  Reason for Consultation: Non pain symptom management and Terminal Care  HPI/Patient Profile: 84 y.o. male  with past medical history of CHF (on home O2), AAA, A fib, HTN, recent admission for CHF exacerbation  admitted on 02/04/2018 from [facility] after being found unresponsive with gurgling breathing. Workup in ED found patient to be hypothermic, hypotensive, bradycardic, chest xray shows pneumonia, pulmonary edema. Deemed to be septic. Per ED physician discussion with family patient was made primarily comfort care but septic workup and antibiotics were continued. Palliative medicine consulted for continued GOC.   Clinical Assessment and Goals of Care:  I have reviewed medical records including EPIC notes, labs and imaging, received report from patient's RNAilene Ravel, assessed the patient and then met at the bedside along with patient's two sons- Patrick [name] and Patrick [name], daughter in law, and significant other- Patrick [name] to discuss diagnosis prognosis, GOC, EOL wishes, disposition and options. He has a third son- Patrick [name] who is in [place] who is aware of patient's state and agrees with current plan for patient.   On exam the patient is actively dying. He is mostly comfortable, using some accessory muscles for breathing. Peripheral pulses are weak and thready. Heart and lung sound diminished. He does not open his eyes or respond to my voice or touch.  I introduced Palliative Medicine as specialized medical care for people living with serious illness. It focuses on providing relief from the symptoms and stress of a serious illness. The goal is to improve quality of life for both the patient and the family.  We discussed a brief life review of  the patient. He is a retired Chief Financial Officer. He was active in the [employer].   As far as functional and nutritional status- family notes significant decline over the last months- especially since December. [name] was moved from assisted living to skilled living a few months ago and his quality of life has declined dramatically. He has become withdrawn. Sleeping much and eating little. He had expressed to his family that he would not want aggressive medical measures to prolong his life and would prefer comfort and peaceful death.    We discussed their current illness and what it means in the larger context of their on-going co-morbidities.  Natural disease trajectory and expectations at EOL were discussed. Family expresses desire for natural peaceful death.  The difference between aggressive medical intervention and comfort care was considered in light of the patient's goals of care. We discussed the currently ordered IV antibiotics. Family agrees this does not align with their desire for patient's peaceful natural death. They feel that patient would not want to continue treatment with IV antibiotics knowing that they would not improve his function or quality of life.   Advanced directives, concepts specific to code status, artifical feeding and hydration, and rehospitalization were considered and  discussed. Patient has DNR in place. No further IV hydration.   Hospice and Palliative Care services outpatient were explained and offered. Patient will remain in hospital until end of life. He is not stable for transfer.   Questions and concerns were addressed. The family was encouraged to call with questions or concerns.   Primary Decision Maker NEXT OF KIN - patient's sons    SUMMARY OF RECOMMENDATIONS -Morphine 51m q2hr for SOB -Morphine 1-227mq30 min prn SOB -Lorazepam .95m51m4hr prn SOB not controlled with morphine -robinul .2mc84mV q4hr prn for terminal secretions -Full comfort care -Not stable  for transfer or discharge    Code Status/Advance Care Planning:  DNR  Palliative Prophylaxis:   Frequent Pain Assessment  Additional Recommendations (Limitations, Scope, Preferences):  Full Comfort Care  Prognosis:    Hours - Days  Discharge Planning: Anticipated Hospital Death  Primary Diagnoses: Present on Admission: . Atrial fibrillation (HCC[place]neur[place] of abdominal [place] (HCC[place] ([place]ension) . Thrombocytopenia (HCC)[place] (HCC[place] (chronic kidney disease), stage II   I have reviewed the medical record, interviewed the patient and family, and examined the patient. The following aspects are pertinent.  Past Medical History:  Diagnosis Date  . AAA (abdominal aortic aneurysm) (HCC)[place]. Adenomatous colon polyp 2004   TVA 04/2006  . Arthritis   . Atrial fibrillation (HCC)[place]. Chronic respiratory failure (HCC)[place]. CKD (chronic kidney disease), stage II   . Diverticulosis   . Dyspnea   . Hemorrhoids   . Hypercholesterolemia   . Hypertension   . Lower extremity edema   . Pleural effusion    Social History   Socioeconomic History  . Marital status: Divorced    Spouse name: Not on file  . Number of children: 3  . Years of education: Not on file  . Highest education level: Not on file  Occupational History  . Occupation: retired    EmplFish farm managerTIRED  Social Needs  . Financial resource strain: Not on file  . Food insecurity:    Worry: Not on file    Inability: Not on file  . Transportation needs:    Medical: Not on file    Non-medical: Not on file  Tobacco Use  . Smoking status: Former Smoker    Types: Cigarettes    Last attempt to quit: 08/29/1969    Years since quitting: 48.4  . Smokeless tobacco: Never Used  Substance and Sexual Activity  . Alcohol use: Yes    Comment: glass of wine 1-2 times per week  . Drug use: No  . Sexual activity: Not on file  Lifestyle  . Physical activity:    Days per week: Not on file      Minutes per session: Not on file  . Stress: Not on file  Relationships  . Social connections:    Talks on phone: Not on file    Gets together: Not on file    Attends religious service: Not on file    Active member of club or organization: Not on file    Attends meetings of clubs or organizations: Not on file    Relationship status: Not on file  Other Topics Concern  . Not on file  Social History Narrative  . Not on file   Family History  Problem Relation Age of Onset  . Emphysema Father   . Diabetes Neg Hx   . Cancer Neg Hx  Scheduled Meds: .  morphine injection  1 mg Intravenous Q2H   Continuous Infusions: . chlorproMAZINE (THORAZINE) IV     PRN Meds:.atropine, chlorproMAZINE (THORAZINE) IV, diphenhydrAMINE, [DISCONTINUED] haloperidol **OR** [DISCONTINUED] haloperidol **OR** haloperidol lactate, [DISCONTINUED] LORazepam **OR** [DISCONTINUED] LORazepam **OR** LORazepam, morphine injection, ondansetron **OR** ondansetron (ZOFRAN) IV, polyvinyl alcohol Medications Prior to Admission:  Prior to Admission medications   Medication Sig Start Date End Date Taking? Authorizing Provider  docusate sodium (COLACE) 100 MG capsule Take 100 mg by mouth daily.   Yes [provider]  ferrous sulfate 325 (65 FE) MG tablet Take 1 tablet (325 mg total) by mouth 3 (three) times daily with meals. 09/26/16  Yes Debbe Odea, MD  furosemide (LASIX) 20 MG tablet Take 20 mg by mouth daily.   Yes [provider]  metoprolol tartrate (LOPRESSOR) 25 MG tablet Take 12.5 mg by mouth 2 (two) times daily.  03/04/16  Yes [provider]  Amino Acids-Protein Hydrolys (FEEDING SUPPLEMENT, PRO-STAT SUGAR FREE 64,) LIQD Take 30 mLs by mouth 2 (two) times daily.    [provider]  furosemide (LASIX) 40 MG tablet Take 40 mg by mouth. Furosemide 40 mg tablet one by mouth for increased edema or weight gain  3 lbs from 152 lbs as needed.    [provider]  metolazone  (ZAROXOLYN) 2.5 MG tablet Take 2.5 mg by mouth daily as needed (for weight gain of 3 pounds overnight or 5 pounds in a week).    [provider]   No Known Allergies Review of Systems  Unable to perform ROS: Acuity of condition    Physical Exam  Constitutional:  frail  Pulmonary/Chest:  diminished  Abdominal: Soft.  Neurological:  Not responding  Skin: There is pallor.  Nursing note and vitals reviewed.   Vital Signs: BP (!) 77/66 (BP Location: Left Arm)   Pulse (!) 51   Temp (S) (!) 87.9 F (31.1 C) (Rectal)   Resp (!) 40   Ht _0  (1.727 m)   Wt 83.1 kg (183 lb 3.2 oz)   SpO2 (!) 80%   BMI 27.86 kg/m  Pain Scale: PAINAD       SpO2: SpO2: (!) 80 % O2 Device:SpO2: (!) 80 % O2 Flow Rate: .O2 Flow Rate (L/min): 2 L/min  IO: Intake/output summary:   Intake/Output Summary (Last 24 hours) at 01/28/2018 1551 Last data filed at 01/28/2018 0101 Gross per 24 hour  Intake 50 ml  Output -  Net 50 ml    LBM:   Baseline Weight: Weight: 69.9 kg (154 lb) Most recent weight: Weight: 83.1 kg (183 lb 3.2 oz)     Palliative Assessment/Data: PPS: 10%     Thank you for this consult. Palliative medicine will continue to follow and assist as needed.   Time In: 1430 Time Out: 1600 Time Total: 90 mins Prolonged services billed: yes Greater than 50%  of this time was spent counseling and coordinating care related to the above assessment and plan.  Signed by: Mariana Kaufman, AGNP-C Palliative Medicine    Please contact Palliative Medicine Team phone at [phone] for questions and concerns.  For individual provider: See Shea Evans

## 2018-01-28 NOTE — Plan of Care (Signed)

## 2018-02-01 LAB — CULTURE, BLOOD (ROUTINE X 2)
CULTURE: NO GROWTH
Culture: NO GROWTH

## 2018-02-16 ENCOUNTER — Ambulatory Visit: Payer: Medicare Other | Admitting: Podiatry

## 2018-02-22 ENCOUNTER — Other Ambulatory Visit: Payer: Medicare Other

## 2018-02-22 ENCOUNTER — Ambulatory Visit: Payer: Medicare Other | Admitting: Hematology and Oncology

## 2018-02-26 NOTE — Progress Notes (Signed)
Nutrition Brief Note  Chart reviewed. Pt now transitioning to comfort care.  No further nutrition interventions warranted at this time.  Please re-consult as needed.   Trenna Kiely A. Jarmaine Ehrler, RD, LDN, CDE Pager: [phone] After hours Pager: [phone]  

## 2018-02-26 NOTE — Progress Notes (Signed)
Pt expired at 1110 hrs. Verified by second RN. MD text paged

## 2018-02-26 NOTE — Progress Notes (Signed)
Daily Progress Note   Patient Name: Patrick [name]       Date: Feb 06, 2018 DOB: April 20, 1921  Age: 84 y.o. MRN#: [id number] Attending Physician: Patrecia Pour, MD Primary Care Physician: Crist Infante, MD Admit Date: 02/01/2018  Reason for Consultation/Follow-up: Establishing goals of care and Terminal Care  Subjective: Patient actively dying. Appears comfortable. Breaths shallow. [name] and patient's sonRichardson Landry- at bedside.  Emotional support given.   ROS  Length of Stay: 2  Current Medications: Scheduled Meds:  .  morphine injection  1 mg Intravenous Q2H    Continuous Infusions: . chlorproMAZINE (THORAZINE) IV      PRN Meds: atropine, chlorproMAZINE (THORAZINE) IV, diphenhydrAMINE, glycopyrrolate, [DISCONTINUED] haloperidol **OR** [DISCONTINUED] haloperidol **OR** haloperidol lactate, [DISCONTINUED] LORazepam **OR** [DISCONTINUED] LORazepam **OR** LORazepam, morphine injection, ondansetron **OR** ondansetron (ZOFRAN) IV, polyvinyl alcohol  Physical Exam  Constitutional:  Frail, elderly  Pulmonary/Chest: No respiratory distress.  shallow  Neurological:  nonresponsive  Skin: There is pallor.  Nursing note and vitals reviewed.           Vital Signs: BP (!) 60/33 (BP Location: Left Arm)   Pulse (!) 45   Temp (!) 97.5 F (36.4 C) (Oral)   Resp 10   Ht 5\' 8"  (1.727 m)   Wt 83.1 kg (183 lb 3.2 oz)   SpO2 (!) 89%   BMI 27.86 kg/m  SpO2: SpO2: (!) 89 % O2 Device: O2 Device: Room Air O2 Flow Rate: O2 Flow Rate (L/min): 2 L/min  Intake/output summary:   Intake/Output Summary (Last 24 hours) at 02-06-2018 0945 Last data filed at 2018/02/06 0631 Gross per 24 hour  Intake 0 ml  Output 0 ml  Net 0 ml   LBM:   Baseline Weight: Weight: 69.9 kg (154 lb) Most recent weight: Weight:  83.1 kg (183 lb 3.2 oz)       Palliative Assessment/Data: PPS: 10%      Patient Active Problem List   Diagnosis Date Noted  . Advanced care planning/counseling discussion   . Acute pulmonary edema (HCC)   . Goals of care, counseling/discussion   . Terminal care   . Palliative care by specialist   . Hyperlipidemia 02/01/2018  . Sepsis ([place]) 02/04/2018  . Pressure injury of skin 12/27/2017  . Acute on chronic heart failure ([place]) 12/26/2017  . Acute heart  failure ([place]) 12/26/2017  . Bradycardia 12/26/2017  . Hypertension   . Pleural effusion   . Lower extremity edema   . Dyspnea   . Chronic respiratory failure ([place])   . CKD (chronic kidney disease), stage II   . Unilateral primary osteoarthritis, left knee 03/16/2017  . Unilateral primary osteoarthritis, right knee 03/16/2017  . Thrombocytopenia ([place]) 02/20/2017  . Chronic pain of both knees 02/15/2017  . Effusion of both knee joints 02/15/2017  . Angiodysplasia of duodenum   . Melena 09/23/2016  . Blood loss anemia 09/23/2016  . Hyperkalemia 09/23/2016  . HTN (hypertension) 08/08/2014  . AAA (abdominal aortic aneurysm) ([place]) 05/03/2013  . 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 Aneurysm of abdominal vessel ([place]) 08/06/2012  . Atrial fibrillation ([place]) 06/15/2009  . Personal history of colonic polyps 06/15/2009    Palliative Care Assessment & Plan   Patient Profile: 84 y.o. male  with past medical history of CHF (on home O2), AAA, A fib, HTN, recent admission for CHF exacerbation  admitted on 02/20/2018 from [facility] after being found unresponsive with gurgling breathing. Workup in ED found patient to be hypothermic, hypotensive, bradycardic, chest xray shows pneumonia, pulmonary edema. Deemed to be septic. Per ED physician discussion with family patient was made primarily comfort care but septic workup and antibiotics were continued. Palliative medicine consulted for continued GOC.    Assessment/Recommendations/Plan   Continue comfort care as ordered  Patient actively dying- not stable for transfer  Goals of Care and Additional Recommendations:  Limitations on Scope of Treatment: Full Comfort Care  Code Status:  DNR  Prognosis:   Hours - Days  Discharge Planning:  Anticipated Hospital Death  Care plan was discussed with patient's son and [name].  Thank you for allowing the Palliative Medicine Team to assist in the care of this patient.   Time In: 0915 Time Out: 0950 Total Time 35 mins Prolonged Time Billed no      Greater than 50%  of this time was spent counseling and coordinating care related to the above assessment and plan.  Mariana Kaufman, AGNP-C Palliative Medicine   Please contact Palliative Medicine Team phone at [phone] for questions and concerns.

## 2018-02-26 NOTE — Death Summary Note (Signed)
DEATH SUMMARY   Patient Details  Name: Patrick [name] MRN: [id number] DOB: Jul 03, 1921  Admission/Discharge Information   Admit Date:  2018/02/24  Date of Death:  2018/02/26  Time of Death:  11:00am  Length of Stay: 2  Referring Physician: Crist Infante, MD   Reason(s) for Hospitalization  Sepsis due to pneumonia  Diagnoses  Preliminary cause of death: Acute respiratory failure with hypoxia ([place]) Secondary Diagnoses (including complications and co-morbidities):  Principal Problem:   Sepsis ([place]) Active Problems:   Atrial fibrillation ([place])   Personal history of colonic polyps   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 year old male who had recent hospitalization for CHF presenting with hypothermia, hypotension, bradycardia, and CXR with pulmonary edema as well as right lower lobe opacity felt to be consistent with sepsis due to pneumonia with superimposed CHF exacerbation. IV antibiotics and fluids were provided for resuscitation though the patient's vital signs did not appreciably improve. Respiratory status continued to decline and after discussions with the family, the goal of care became the patient's comfort. He was actively dying, so transfer to residential hospice was not possible. The patient died 02/26/2018 at approximately 11:00am.   Pertinent Labs and Studies  Significant Diagnostic Studies Dg Chest Port 1 View  Result Date: February 24, 2018 CLINICAL DATA:  Hypothermia. EXAM: PORTABLE CHEST 1 VIEW COMPARISON:  Chest x-ray dated  12/26/2017. FINDINGS: Stable cardiomegaly. Bilateral perihilar and bibasilar opacities suggesting a combination of pulmonary edema and layering pleural effusions. Asymmetrically prominent opacity at the RIGHT lung base. Pacer pad overlies the LEFT chest wall. No acute or suspicious osseous finding seen. IMPRESSION: 1. Cardiomegaly with perihilar and bibasilar opacities suggesting pulmonary edema and/or layering pleural effusions. Suspect some degree of CHF. 2. Asymmetrically prominent opacity at the RIGHT lung base, suspected atelectasis. If febrile, underlying pneumonia could not be excluded. Electronically Signed   By: Franki Cabot M.D.   On: 02/24/2018 10:16    Microbiology Recent Results (from the past 240 hour(s))  Blood Culture (routine x 2)     Status: None (Preliminary result)   Collection Time: 24-Feb-2018 10:10 AM  Result Value Ref Range Status   Specimen Description BLOOD LEFT FOREARM  Final   Special Requests   Final    BOTTLES DRAWN AEROBIC AND ANAEROBIC Blood Culture results may not be optimal due to an inadequate volume of blood received in culture bottles   Culture   Final    NO GROWTH 1 DAY Performed at [facility], [place] [address] [place], [place] [address]    Report Status PENDING  Incomplete  Blood Culture (routine x 2)     Status: None (Preliminary result)   Collection Time: Feb 24, 2018 10:17 AM  Result Value Ref Range Status   Specimen Description BLOOD RIGHT HAND  Final   Special Requests   Final    BOTTLES DRAWN AEROBIC AND ANAEROBIC Blood Culture results may not be optimal due to an inadequate volume of blood received in culture bottles   Culture   Final    NO GROWTH 1  DAY Performed at [facility], [place] [address], [place], [place] [address]    Report Status PENDING  Incomplete  Urine culture     Status: None   Collection Time: 02/20/2018 10:30 AM  Result Value Ref Range Status   Specimen Description URINE, CATHETERIZED  Final   Special Requests NONE   Final   Culture   Final    NO GROWTH Performed at [facility], [place] [address], [place], [place] [address]    Report Status 01/28/2018 FINAL  Final    Lab Basic Metabolic Panel: Recent Labs  Lab 02/24/2018 0944 02/03/2018 1045  NA 146* 150*  K 4.3 4.9  CL 104 103  CO2  --  34*  GLUCOSE 89 97  BUN 93* 109*  CREATININE 1.90* 2.29*  CALCIUM  --  8.4*   Liver Function Tests: Recent Labs  Lab 02/10/2018 1045  AST 47*  ALT 16*  ALKPHOS 194*  BILITOT 3.1*  PROT 5.8*  ALBUMIN 2.8*   No results for input(s): LIPASE, AMYLASE in the last 168 hours. No results for input(s): AMMONIA in the last 168 hours. CBC: Recent Labs  Lab 02/14/2018 0944 02/18/2018 1045  WBC  --  4.5  NEUTROABS  --  3.1  HGB 13.9 13.8  HCT 41.0 44.0  MCV  --  104.5*  PLT  --  56*   Cardiac Enzymes: No results for input(s): CKTOTAL, CKMB, CKMBINDEX, TROPONINI in the last 168 hours. Sepsis Labs: Recent Labs  Lab 02/15/2018 1003 02/22/2018 1045  WBC  --  4.5  LATICACIDVEN 3.25*  --     Procedures/Operations  None   Patrecia Pour, MD 02/28/18, 12:03 PM

## 2018-02-26 DEATH — deceased

## 2018-03-16 ENCOUNTER — Ambulatory Visit: Payer: Medicare Other | Admitting: Family

## 2018-03-16 ENCOUNTER — Other Ambulatory Visit (HOSPITAL_COMMUNITY): Payer: Medicare Other

## 2018-03-23 ENCOUNTER — Ambulatory Visit: Payer: Medicare Other | Admitting: Family

## 2018-03-23 ENCOUNTER — Other Ambulatory Visit (HOSPITAL_COMMUNITY): Payer: Medicare Other

## 2018-05-03 ENCOUNTER — Ambulatory Visit: Payer: Medicare Other | Admitting: Cardiovascular Disease

## 2019-07-16 IMAGING — DX DG CHEST 2V
2 series · 2 of 2 positions shown · non-contrast
Comparison: None.

CLINICAL DATA: Left leg swelling

EXAM:
CHEST - 2 VIEW

[w chest lat]
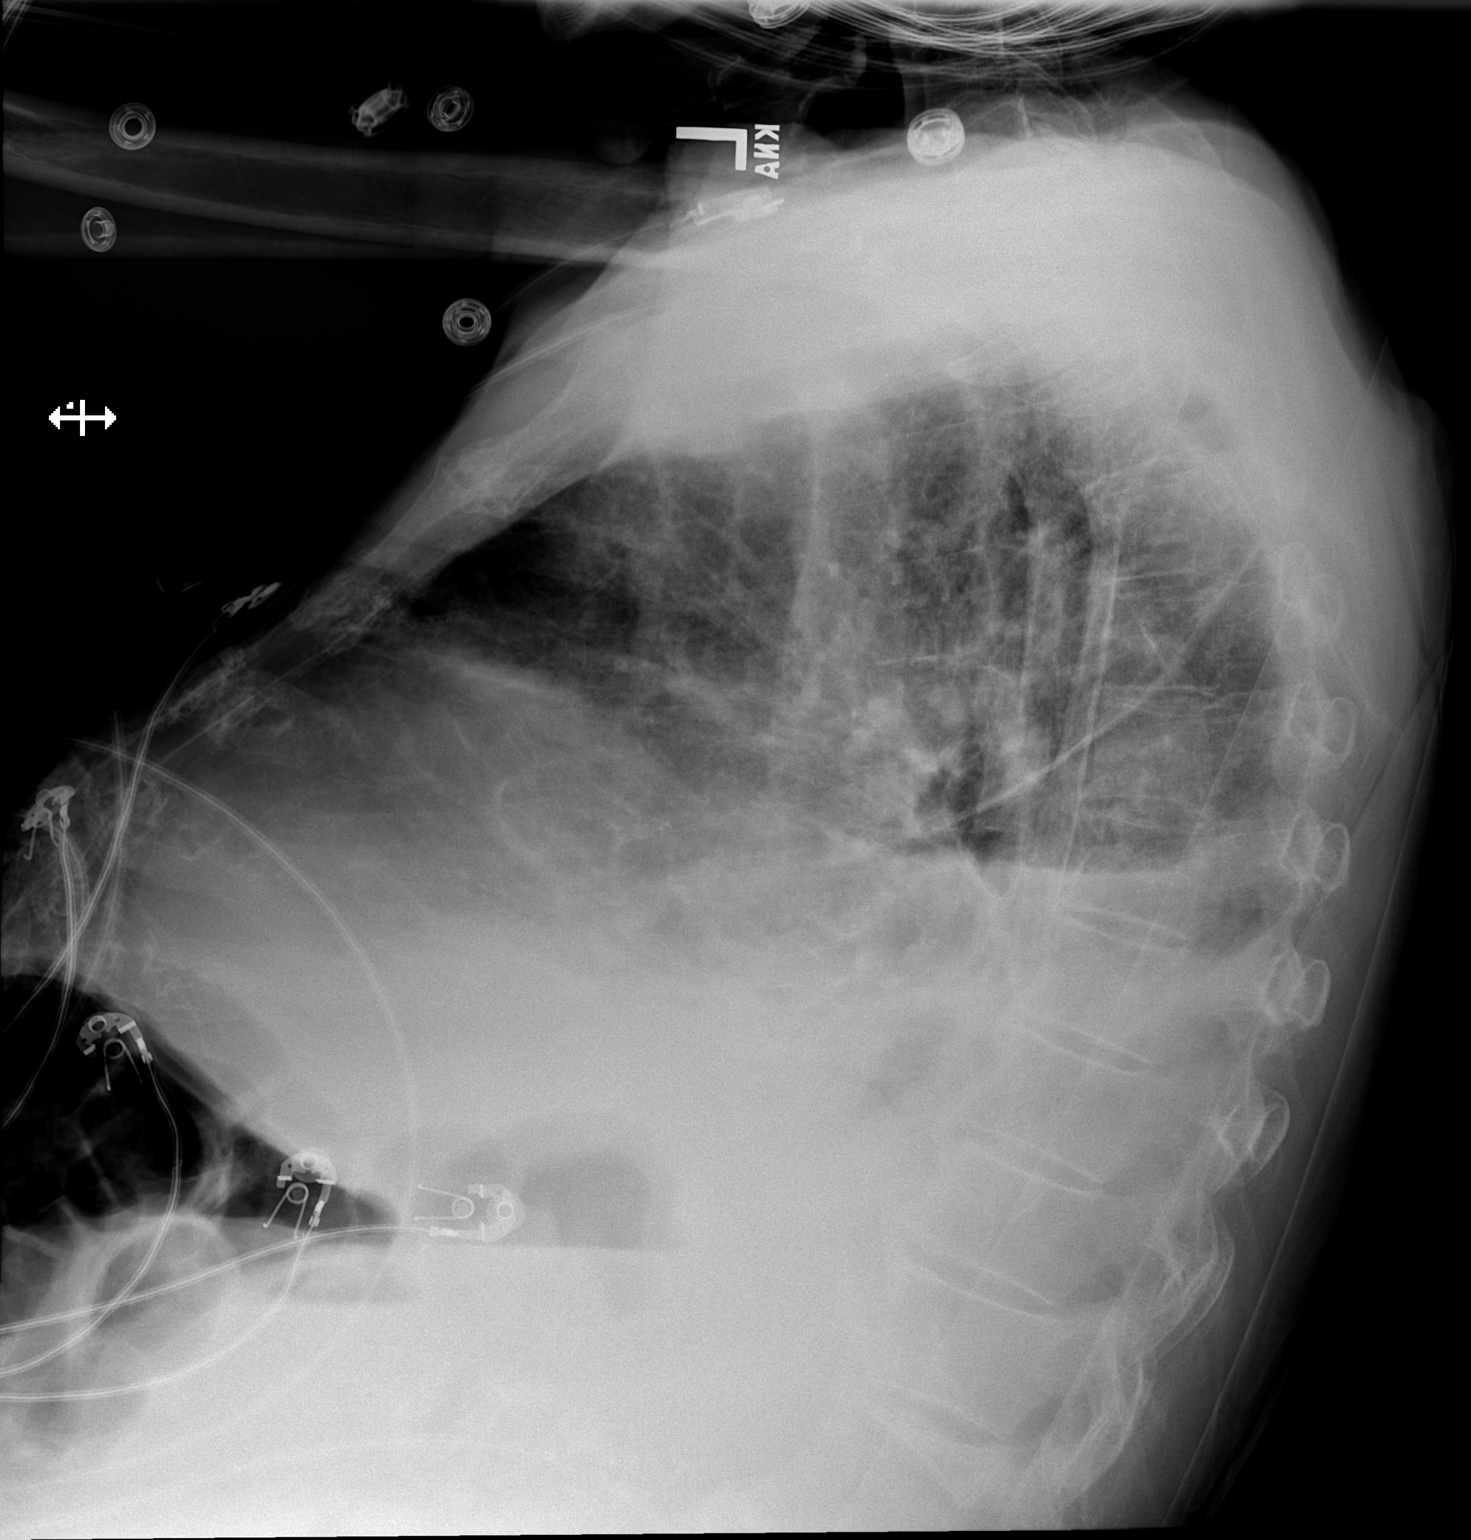

[x chest ap]
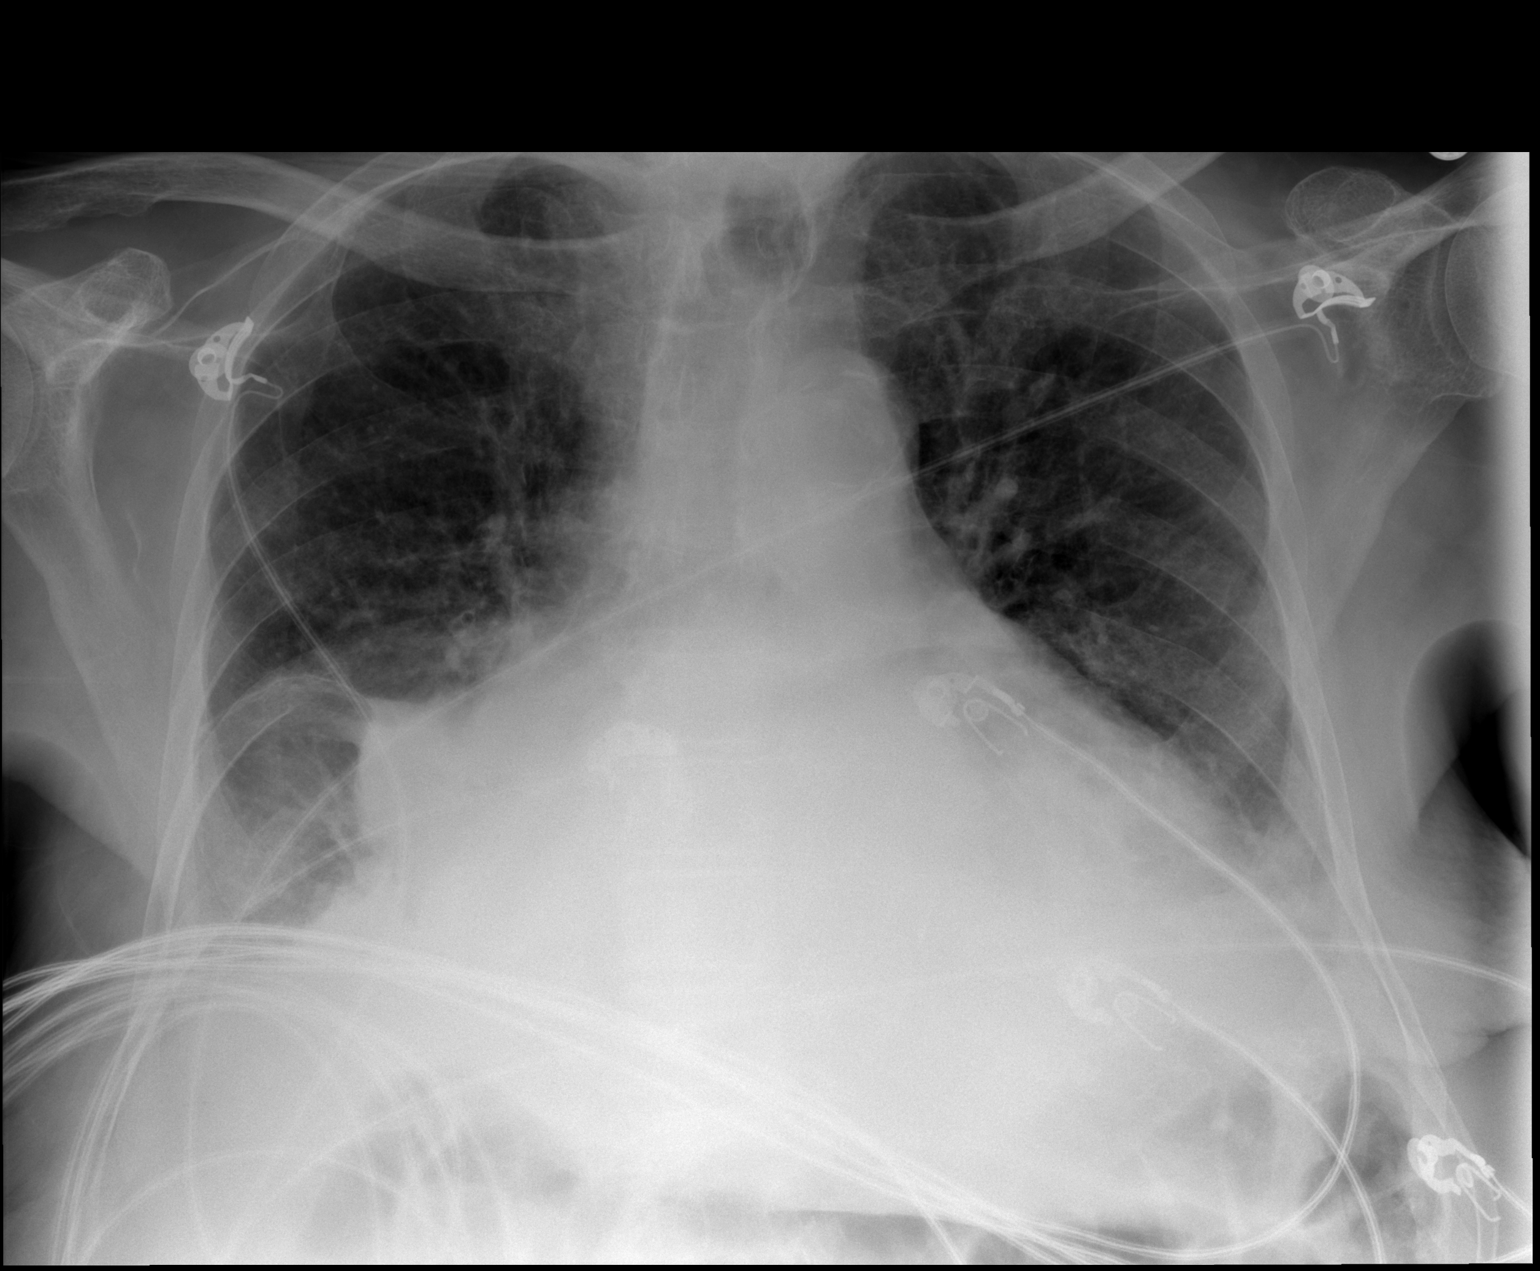

[2 of 2 positions shown; findings below may reference images not displayed]

FINDINGS: Cardiopericardial enlargement and haziness of the bilateral chest
from layering pleural fluid that is small to moderate. There is a
dense opacity at the medial right base, probable atelectasis given
setting and margins. No mass was seen at the right base on a
09/28/2017 lumbar spine radiograph.. Vascular pedicle widening and
cephalized blood flow.
IMPRESSION: Cardiopericardial enlargement, vascular congestion, and pleural
effusions obscuring much of the lower lungs.
